# Patient Record
Sex: Male | Born: 1945
Health system: Southern US, Community
[De-identification: ages and names within clinical notes are randomized; demographics above are authoritative.]

## PROBLEM LIST (undated history)

## (undated) DIAGNOSIS — I10 Essential (primary) hypertension: Secondary | ICD-10-CM

## (undated) DIAGNOSIS — I2510 Atherosclerotic heart disease of native coronary artery without angina pectoris: Secondary | ICD-10-CM

## (undated) DIAGNOSIS — G43109 Migraine with aura, not intractable, without status migrainosus: Secondary | ICD-10-CM

## (undated) DIAGNOSIS — M47817 Spondylosis without myelopathy or radiculopathy, lumbosacral region: Secondary | ICD-10-CM

## (undated) DIAGNOSIS — H9191 Unspecified hearing loss, right ear: Secondary | ICD-10-CM

## (undated) DIAGNOSIS — N400 Enlarged prostate without lower urinary tract symptoms: Secondary | ICD-10-CM

## (undated) DIAGNOSIS — R413 Other amnesia: Secondary | ICD-10-CM

## (undated) DIAGNOSIS — K5792 Diverticulitis of intestine, part unspecified, without perforation or abscess without bleeding: Secondary | ICD-10-CM

## (undated) DIAGNOSIS — I252 Old myocardial infarction: Secondary | ICD-10-CM

## (undated) DIAGNOSIS — I700 Atherosclerosis of aorta: Secondary | ICD-10-CM

## (undated) DIAGNOSIS — K279 Peptic ulcer, site unspecified, unspecified as acute or chronic, without hemorrhage or perforation: Secondary | ICD-10-CM

## (undated) DIAGNOSIS — K4090 Unilateral inguinal hernia, without obstruction or gangrene, not specified as recurrent: Secondary | ICD-10-CM

## (undated) DIAGNOSIS — F419 Anxiety disorder, unspecified: Secondary | ICD-10-CM

## (undated) DIAGNOSIS — E785 Hyperlipidemia, unspecified: Secondary | ICD-10-CM

## (undated) DIAGNOSIS — N411 Chronic prostatitis: Secondary | ICD-10-CM

## (undated) HISTORY — DX: Unilateral inguinal hernia, without obstruction or gangrene, not specified as recurrent: K40.90

## (undated) HISTORY — DX: Peptic ulcer, site unspecified, unspecified as acute or chronic, without hemorrhage or perforation: K27.9

## (undated) HISTORY — DX: Anxiety disorder, unspecified: F41.9

## (undated) HISTORY — PX: INGUINAL HERNIA REPAIR: SUR1180

## (undated) HISTORY — PX: CORONARY STENT PLACEMENT: SHX1402

## (undated) HISTORY — DX: Benign prostatic hyperplasia without lower urinary tract symptoms: N40.0

## (undated) HISTORY — DX: Unspecified hearing loss, right ear: H91.91

## (undated) HISTORY — DX: Atherosclerosis of aorta: I70.0

## (undated) HISTORY — DX: Old myocardial infarction: I25.2

## (undated) HISTORY — DX: Chronic prostatitis: N41.1

## (undated) HISTORY — DX: Other amnesia: R41.3

## (undated) HISTORY — DX: Atherosclerotic heart disease of native coronary artery without angina pectoris: I25.10

## (undated) HISTORY — DX: Essential (primary) hypertension: I10

## (undated) HISTORY — DX: Migraine with aura, not intractable, without status migrainosus: G43.109

## (undated) HISTORY — DX: Hyperlipidemia, unspecified: E78.5

## (undated) HISTORY — DX: Spondylosis without myelopathy or radiculopathy, lumbosacral region: M47.817

## (undated) HISTORY — PX: APPENDECTOMY: SHX54

## (undated) HISTORY — DX: Diverticulitis of intestine, part unspecified, without perforation or abscess without bleeding: K57.92

---

## 1987-07-04 DIAGNOSIS — I252 Old myocardial infarction: Secondary | ICD-10-CM

## 1987-07-04 HISTORY — DX: Old myocardial infarction: I25.2

## 1999-12-23 ENCOUNTER — Encounter: Payer: Self-pay | Admitting: Gastroenterology

## 2000-05-22 ENCOUNTER — Encounter: Payer: Self-pay | Admitting: Gastroenterology

## 2005-03-29 ENCOUNTER — Encounter: Payer: Self-pay | Admitting: Gastroenterology

## 2006-02-20 ENCOUNTER — Ambulatory Visit: Payer: Self-pay | Admitting: Internal Medicine

## 2006-02-21 ENCOUNTER — Ambulatory Visit: Payer: Self-pay | Admitting: Internal Medicine

## 2006-02-21 LAB — CONVERTED CEMR LAB: PSA: 0.84 ng/mL

## 2006-04-20 ENCOUNTER — Ambulatory Visit: Payer: Self-pay | Admitting: Internal Medicine

## 2006-06-18 ENCOUNTER — Ambulatory Visit: Payer: Self-pay | Admitting: Internal Medicine

## 2006-07-09 ENCOUNTER — Encounter: Admission: RE | Admit: 2006-07-09 | Discharge: 2006-07-09 | Payer: Self-pay | Admitting: Internal Medicine

## 2007-03-06 ENCOUNTER — Encounter: Payer: Self-pay | Admitting: Internal Medicine

## 2007-03-06 ENCOUNTER — Ambulatory Visit: Payer: Self-pay | Admitting: Internal Medicine

## 2007-03-06 DIAGNOSIS — Z8719 Personal history of other diseases of the digestive system: Secondary | ICD-10-CM | POA: Insufficient documentation

## 2007-03-06 DIAGNOSIS — E785 Hyperlipidemia, unspecified: Secondary | ICD-10-CM | POA: Insufficient documentation

## 2007-03-06 DIAGNOSIS — N411 Chronic prostatitis: Secondary | ICD-10-CM | POA: Insufficient documentation

## 2007-03-06 DIAGNOSIS — J309 Allergic rhinitis, unspecified: Secondary | ICD-10-CM

## 2007-03-06 DIAGNOSIS — I10 Essential (primary) hypertension: Secondary | ICD-10-CM

## 2007-03-06 DIAGNOSIS — K279 Peptic ulcer, site unspecified, unspecified as acute or chronic, without hemorrhage or perforation: Secondary | ICD-10-CM | POA: Insufficient documentation

## 2007-03-06 DIAGNOSIS — N400 Enlarged prostate without lower urinary tract symptoms: Secondary | ICD-10-CM | POA: Insufficient documentation

## 2007-03-06 DIAGNOSIS — I2510 Atherosclerotic heart disease of native coronary artery without angina pectoris: Secondary | ICD-10-CM | POA: Insufficient documentation

## 2007-03-06 LAB — CONVERTED CEMR LAB
ALT: 25 units/L (ref 0–53)
Alkaline Phosphatase: 62 units/L (ref 39–117)
BUN: 15 mg/dL (ref 6–23)
Bilirubin Urine: NEGATIVE
Bilirubin, Direct: 0.1 mg/dL (ref 0.0–0.3)
CO2: 31 meq/L (ref 19–32)
Calcium: 9.3 mg/dL (ref 8.4–10.5)
Cholesterol: 159 mg/dL (ref 0–200)
Eosinophils Absolute: 0.1 10*3/uL (ref 0.0–0.6)
GFR calc Af Amer: 87 mL/min
GFR calc non Af Amer: 72 mL/min
Hemoglobin, Urine: NEGATIVE
Hemoglobin: 15.4 g/dL (ref 13.0–17.0)
LDL Cholesterol: 89 mg/dL (ref 0–99)
Leukocytes, UA: NEGATIVE
Lymphocytes Relative: 18.7 % (ref 12.0–46.0)
MCHC: 35.2 g/dL (ref 30.0–36.0)
MCV: 91.8 fL (ref 78.0–100.0)
Monocytes Absolute: 0.6 10*3/uL (ref 0.2–0.7)
Monocytes Relative: 9.4 % (ref 3.0–11.0)
Neutro Abs: 4.8 10*3/uL (ref 1.4–7.7)
Platelets: 195 10*3/uL (ref 150–400)
Potassium: 4.9 meq/L (ref 3.5–5.1)
Specific Gravity, Urine: 1.01 (ref 1.000–1.03)
TSH: 1.27 microintl units/mL (ref 0.35–5.50)
Total Protein, Urine: NEGATIVE mg/dL
Total Protein: 6.9 g/dL (ref 6.0–8.3)
Urine Glucose: NEGATIVE mg/dL

## 2007-04-04 ENCOUNTER — Ambulatory Visit: Payer: Self-pay | Admitting: Internal Medicine

## 2007-04-11 ENCOUNTER — Ambulatory Visit: Payer: Self-pay | Admitting: Internal Medicine

## 2007-05-06 ENCOUNTER — Ambulatory Visit: Payer: Self-pay | Admitting: Gastroenterology

## 2007-05-07 ENCOUNTER — Encounter: Payer: Self-pay | Admitting: Internal Medicine

## 2007-05-07 ENCOUNTER — Ambulatory Visit: Payer: Self-pay | Admitting: Gastroenterology

## 2007-05-09 ENCOUNTER — Ambulatory Visit: Payer: Self-pay | Admitting: Cardiology

## 2007-06-12 ENCOUNTER — Ambulatory Visit: Payer: Self-pay | Admitting: Internal Medicine

## 2007-06-12 LAB — CONVERTED CEMR LAB
OCCULT 1: NEGATIVE
OCCULT 2: NEGATIVE

## 2007-07-08 ENCOUNTER — Ambulatory Visit: Payer: Self-pay | Admitting: Gastroenterology

## 2007-07-12 ENCOUNTER — Ambulatory Visit: Payer: Self-pay | Admitting: Internal Medicine

## 2007-07-13 ENCOUNTER — Encounter: Payer: Self-pay | Admitting: Internal Medicine

## 2007-07-13 DIAGNOSIS — I252 Old myocardial infarction: Secondary | ICD-10-CM | POA: Insufficient documentation

## 2007-08-29 DIAGNOSIS — K4090 Unilateral inguinal hernia, without obstruction or gangrene, not specified as recurrent: Secondary | ICD-10-CM | POA: Insufficient documentation

## 2007-08-29 DIAGNOSIS — R1013 Epigastric pain: Secondary | ICD-10-CM

## 2007-08-29 DIAGNOSIS — R109 Unspecified abdominal pain: Secondary | ICD-10-CM | POA: Insufficient documentation

## 2007-11-14 ENCOUNTER — Encounter: Payer: Self-pay | Admitting: Internal Medicine

## 2008-03-05 ENCOUNTER — Ambulatory Visit: Payer: Self-pay | Admitting: Internal Medicine

## 2008-03-10 LAB — CONVERTED CEMR LAB
Basophils Absolute: 0 10*3/uL (ref 0.0–0.1)
Basophils Relative: 0.5 % (ref 0.0–3.0)
Bilirubin Urine: NEGATIVE
Bilirubin, Direct: 0.2 mg/dL (ref 0.0–0.3)
Calcium: 9.2 mg/dL (ref 8.4–10.5)
Cholesterol: 151 mg/dL (ref 0–200)
Creatinine, Ser: 1.1 mg/dL (ref 0.4–1.5)
Eosinophils Absolute: 0.1 10*3/uL (ref 0.0–0.7)
GFR calc non Af Amer: 72 mL/min
HDL: 39.4 mg/dL (ref >39.0)
Hemoglobin: 15.2 g/dL (ref 13.0–17.0)
Ketones, ur: NEGATIVE mg/dL
LDL Cholesterol: 89 mg/dL (ref 0–99)
Leukocytes, UA: NEGATIVE
Lymphocytes Relative: 21.2 % (ref 12.0–46.0)
MCHC: 35.1 g/dL (ref 30.0–36.0)
MCV: 94.7 fL (ref 78.0–100.0)
Neutro Abs: 3.7 10*3/uL (ref 1.4–7.7)
Neutrophils Relative %: 66.6 % (ref 43.0–77.0)
PSA: 1.94 ng/mL (ref 0.10–4.00)
RBC: 4.56 M/uL (ref 4.22–5.81)
RDW: 11.7 % (ref 11.5–14.6)
Sodium: 141 meq/L (ref 135–145)
Specific Gravity, Urine: 1.02 (ref 1.000–1.03)
TSH: 1.08 microintl units/mL (ref 0.35–5.50)
Total Bilirubin: 1 mg/dL (ref 0.3–1.2)
Total CHOL/HDL Ratio: 3.8
Triglycerides: 111 mg/dL (ref 0–149)
Urine Glucose: NEGATIVE mg/dL
Urobilinogen, UA: 0.2 (ref 0.0–1.0)

## 2008-03-12 ENCOUNTER — Ambulatory Visit: Payer: Self-pay | Admitting: Cardiovascular Disease

## 2008-09-01 ENCOUNTER — Encounter: Payer: Self-pay | Admitting: Internal Medicine

## 2009-03-19 ENCOUNTER — Ambulatory Visit: Payer: Self-pay | Admitting: Internal Medicine

## 2009-03-19 DIAGNOSIS — H903 Sensorineural hearing loss, bilateral: Secondary | ICD-10-CM | POA: Insufficient documentation

## 2009-03-19 DIAGNOSIS — M7500 Adhesive capsulitis of unspecified shoulder: Secondary | ICD-10-CM

## 2009-03-19 DIAGNOSIS — I700 Atherosclerosis of aorta: Secondary | ICD-10-CM

## 2009-03-19 DIAGNOSIS — H9190 Unspecified hearing loss, unspecified ear: Secondary | ICD-10-CM | POA: Insufficient documentation

## 2009-03-19 LAB — CONVERTED CEMR LAB
ALT: 20 units/L (ref 0–53)
Albumin: 4 g/dL (ref 3.5–5.2)
Basophils Relative: 0.8 % (ref 0.0–3.0)
Bilirubin, Direct: 0.1 mg/dL (ref 0.0–0.3)
CO2: 33 meq/L — ABNORMAL HIGH (ref 19–32)
Chloride: 107 meq/L (ref 96–112)
Eosinophils Relative: 1.7 % (ref 0.0–5.0)
HCT: 44 % (ref 39.0–52.0)
Hemoglobin: 15 g/dL (ref 13.0–17.0)
Hgb A1c MFr Bld: 5.7 % (ref 4.6–6.5)
Leukocytes, UA: NEGATIVE
Lymphs Abs: 1.1 10*3/uL (ref 0.7–4.0)
MCV: 95.1 fL (ref 78.0–100.0)
Monocytes Absolute: 0.6 10*3/uL (ref 0.1–1.0)
Neutro Abs: 3.5 10*3/uL (ref 1.4–7.7)
Neutrophils Relative %: 65.3 % (ref 43.0–77.0)
PSA: 0.89 ng/mL (ref 0.10–4.00)
Potassium: 4.5 meq/L (ref 3.5–5.1)
RBC: 4.63 M/uL (ref 4.22–5.81)
Sodium: 145 meq/L (ref 135–145)
Specific Gravity, Urine: 1.01 (ref 1.000–1.030)
TSH: 1.07 microintl units/mL (ref 0.35–5.50)
Total CHOL/HDL Ratio: 4
Total Protein: 7.5 g/dL (ref 6.0–8.3)
Triglycerides: 164 mg/dL — ABNORMAL HIGH (ref 0.0–149.0)
Urine Glucose: NEGATIVE mg/dL
Urobilinogen, UA: 0.2 (ref 0.0–1.0)
WBC: 5.3 10*3/uL (ref 4.5–10.5)

## 2009-03-23 ENCOUNTER — Telehealth: Payer: Self-pay | Admitting: Internal Medicine

## 2009-03-24 ENCOUNTER — Telehealth: Payer: Self-pay | Admitting: Internal Medicine

## 2009-03-25 ENCOUNTER — Encounter: Payer: Self-pay | Admitting: Internal Medicine

## 2009-03-27 ENCOUNTER — Emergency Department (HOSPITAL_BASED_OUTPATIENT_CLINIC_OR_DEPARTMENT_OTHER): Admission: EM | Admit: 2009-03-27 | Discharge: 2009-03-27 | Payer: Self-pay | Admitting: Emergency Medicine

## 2009-03-27 ENCOUNTER — Encounter: Payer: Self-pay | Admitting: Internal Medicine

## 2009-03-27 ENCOUNTER — Encounter: Payer: Self-pay | Admitting: Cardiovascular Disease

## 2009-03-29 ENCOUNTER — Ambulatory Visit: Payer: Self-pay

## 2009-03-29 ENCOUNTER — Encounter: Payer: Self-pay | Admitting: Internal Medicine

## 2009-03-31 ENCOUNTER — Ambulatory Visit: Payer: Self-pay | Admitting: Radiology

## 2009-04-06 ENCOUNTER — Encounter (INDEPENDENT_AMBULATORY_CARE_PROVIDER_SITE_OTHER): Payer: Self-pay | Admitting: *Deleted

## 2009-04-06 ENCOUNTER — Ambulatory Visit: Payer: Self-pay | Admitting: Cardiovascular Disease

## 2009-04-06 DIAGNOSIS — G459 Transient cerebral ischemic attack, unspecified: Secondary | ICD-10-CM | POA: Insufficient documentation

## 2009-04-13 ENCOUNTER — Encounter: Payer: Self-pay | Admitting: Cardiovascular Disease

## 2009-04-13 ENCOUNTER — Ambulatory Visit: Payer: Self-pay

## 2009-09-01 ENCOUNTER — Encounter: Payer: Self-pay | Admitting: Internal Medicine

## 2009-09-02 ENCOUNTER — Encounter (INDEPENDENT_AMBULATORY_CARE_PROVIDER_SITE_OTHER): Payer: Self-pay | Admitting: *Deleted

## 2009-10-06 ENCOUNTER — Ambulatory Visit: Payer: Self-pay | Admitting: Cardiovascular Disease

## 2009-10-26 ENCOUNTER — Encounter (INDEPENDENT_AMBULATORY_CARE_PROVIDER_SITE_OTHER): Payer: Self-pay | Admitting: *Deleted

## 2009-10-26 ENCOUNTER — Ambulatory Visit (HOSPITAL_COMMUNITY): Admission: RE | Admit: 2009-10-26 | Discharge: 2009-10-26 | Payer: Self-pay | Admitting: Cardiovascular Disease

## 2009-10-26 ENCOUNTER — Encounter: Payer: Self-pay | Admitting: Cardiovascular Disease

## 2009-10-26 ENCOUNTER — Ambulatory Visit: Payer: Self-pay

## 2009-10-26 ENCOUNTER — Ambulatory Visit: Payer: Self-pay | Admitting: Cardiology

## 2009-10-26 ENCOUNTER — Encounter (INDEPENDENT_AMBULATORY_CARE_PROVIDER_SITE_OTHER): Payer: Self-pay

## 2009-11-18 ENCOUNTER — Ambulatory Visit: Payer: Self-pay | Admitting: Internal Medicine

## 2009-11-18 DIAGNOSIS — G454 Transient global amnesia: Secondary | ICD-10-CM

## 2009-11-18 DIAGNOSIS — G43809 Other migraine, not intractable, without status migrainosus: Secondary | ICD-10-CM

## 2010-03-25 ENCOUNTER — Encounter: Payer: Self-pay | Admitting: Internal Medicine

## 2010-04-14 ENCOUNTER — Ambulatory Visit: Payer: Self-pay | Admitting: Cardiovascular Disease

## 2010-04-27 ENCOUNTER — Ambulatory Visit: Payer: Self-pay | Admitting: Internal Medicine

## 2010-04-27 DIAGNOSIS — R5383 Other fatigue: Secondary | ICD-10-CM

## 2010-04-27 DIAGNOSIS — R5381 Other malaise: Secondary | ICD-10-CM | POA: Insufficient documentation

## 2010-04-27 LAB — CONVERTED CEMR LAB
ALT: 28 units/L (ref 0–53)
Alkaline Phosphatase: 57 units/L (ref 39–117)
Basophils Relative: 0.3 % (ref 0.0–3.0)
Bilirubin, Direct: 0.1 mg/dL (ref 0.0–0.3)
Calcium: 9.3 mg/dL (ref 8.4–10.5)
Creatinine, Ser: 1.1 mg/dL (ref 0.4–1.5)
Eosinophils Relative: 1.4 % (ref 0.0–5.0)
Ketones, ur: NEGATIVE mg/dL
Leukocytes, UA: NEGATIVE
Lymphocytes Relative: 24.7 % (ref 12.0–46.0)
Neutrophils Relative %: 63.4 % (ref 43.0–77.0)
PSA: 0.86 ng/mL (ref 0.10–4.00)
RBC: 4.94 M/uL (ref 4.22–5.81)
Specific Gravity, Urine: 1.015 (ref 1.000–1.030)
Total CHOL/HDL Ratio: 5
Total Protein: 6.9 g/dL (ref 6.0–8.3)
Triglycerides: 217 mg/dL — ABNORMAL HIGH (ref 0.0–149.0)
Urobilinogen, UA: 0.2 (ref 0.0–1.0)
WBC: 6.8 10*3/uL (ref 4.5–10.5)

## 2010-05-12 ENCOUNTER — Encounter (INDEPENDENT_AMBULATORY_CARE_PROVIDER_SITE_OTHER): Payer: Self-pay | Admitting: *Deleted

## 2010-05-20 ENCOUNTER — Ambulatory Visit: Payer: Self-pay | Admitting: Internal Medicine

## 2010-05-20 ENCOUNTER — Encounter: Payer: Self-pay | Admitting: Internal Medicine

## 2010-05-20 DIAGNOSIS — R079 Chest pain, unspecified: Secondary | ICD-10-CM

## 2010-05-31 ENCOUNTER — Telehealth (INDEPENDENT_AMBULATORY_CARE_PROVIDER_SITE_OTHER): Payer: Self-pay | Admitting: Radiology

## 2010-06-01 ENCOUNTER — Ambulatory Visit: Payer: Self-pay

## 2010-06-01 ENCOUNTER — Encounter (HOSPITAL_COMMUNITY)
Admission: RE | Admit: 2010-06-01 | Discharge: 2010-08-02 | Payer: Self-pay | Source: Home / Self Care | Attending: Internal Medicine | Admitting: Internal Medicine

## 2010-06-01 ENCOUNTER — Ambulatory Visit: Payer: Self-pay | Admitting: Cardiology

## 2010-06-01 ENCOUNTER — Encounter: Payer: Self-pay | Admitting: Cardiology

## 2010-06-02 ENCOUNTER — Encounter: Payer: Self-pay | Admitting: Internal Medicine

## 2010-06-02 DIAGNOSIS — R9439 Abnormal result of other cardiovascular function study: Secondary | ICD-10-CM | POA: Insufficient documentation

## 2010-06-03 ENCOUNTER — Ambulatory Visit: Payer: Self-pay | Admitting: Cardiovascular Disease

## 2010-06-03 LAB — CONVERTED CEMR LAB
BUN: 17 mg/dL (ref 6–23)
Basophils Absolute: 0 10*3/uL (ref 0.0–0.1)
Calcium: 9.3 mg/dL (ref 8.4–10.5)
Creatinine, Ser: 1.2 mg/dL (ref 0.4–1.5)
GFR calc non Af Amer: 66.91 mL/min (ref >60)
Glucose, Bld: 99 mg/dL (ref 70–99)
Hemoglobin: 14.7 g/dL (ref 13.0–17.0)
INR: 1 (ref 0.8–1.0)
Lymphocytes Relative: 20.6 % (ref 12.0–46.0)
Monocytes Relative: 10.8 % (ref 3.0–12.0)
Neutrophils Relative %: 67.6 % (ref 43.0–77.0)
Platelets: 198 10*3/uL (ref 150.0–400.0)
Prothrombin Time: 11.2 s (ref 9.7–11.8)
RDW: 12.6 % (ref 11.5–14.6)
Sodium: 141 meq/L (ref 135–145)
aPTT: 28.3 s (ref 21.7–28.8)

## 2010-06-09 ENCOUNTER — Ambulatory Visit
Admission: RE | Admit: 2010-06-09 | Disposition: A | Discharge status: Still In Hospital | Payer: Self-pay | Source: Home / Self Care | Admitting: Cardiovascular Disease

## 2010-06-09 ENCOUNTER — Ambulatory Visit (HOSPITAL_COMMUNITY)
Admission: RE | Admit: 2010-06-09 | Discharge: 2010-06-10 | Payer: Self-pay | Source: Home / Self Care | Attending: Cardiovascular Disease | Admitting: Cardiovascular Disease

## 2010-06-11 ENCOUNTER — Emergency Department (HOSPITAL_BASED_OUTPATIENT_CLINIC_OR_DEPARTMENT_OTHER)
Admission: EM | Admit: 2010-06-11 | Discharge: 2010-06-11 | Disposition: A | Discharge status: Other Acute Inpatient Hospital | Payer: Self-pay | Source: Home / Self Care | Admitting: Emergency Medicine

## 2010-06-11 ENCOUNTER — Observation Stay (HOSPITAL_COMMUNITY)
Admission: EM | Admit: 2010-06-11 | Discharge: 2010-06-13 | Payer: Self-pay | Attending: Cardiology | Admitting: Cardiology

## 2010-06-11 ENCOUNTER — Telehealth (INDEPENDENT_AMBULATORY_CARE_PROVIDER_SITE_OTHER): Payer: Self-pay | Admitting: *Deleted

## 2010-06-30 ENCOUNTER — Ambulatory Visit: Payer: Self-pay | Admitting: Cardiovascular Disease

## 2010-07-30 NOTE — H&P (Signed)
  [name] [name], [name] [name]                  ACCOUNT NO.:  [id number]  MEDICAL RECORD NO.:  [id number]          PATIENT TYPE:  OBV  LOCATION:  6522                         FACILITY:  [facility]  PHYSICIAN:  Verne Carrow, MDDATE OF BIRTH:  [birth year]  DATE OF ADMISSION:  06/11/2010 DATE OF DISCHARGE:  06/13/2010                             HISTORY & PHYSICAL   HISTORY OF PRESENT ILLNESS:  [name] [name] is a 65 year old Caucasian male with a history of chronically occluded right coronary artery, who was admitted to [facility] on June 11, 2010, for observation after he had recurrent chest pain.  The patient had been seen the previous week at [facility], at which time, a left heart catheterization showed a chronically occluded right coronary artery as well as a newly stenotic lesion in the first obtuse marginal branch and circumflex artery.  A drug-eluting stent was placed in the obtuse marginal branch at that time.  The patient did well following procedure and was discharged to home on June 10, 2010.  Unfortunately, the following day, he had recurrence of atypical-type chest pain and presented to the [facility] where his troponin was elevated at 0.28.  The patient was transferred to [facility]. A detailed history and physical is in the electronic record as dictated by Dr. Antoine Poche.  We were asked to repeat his cardiac catheterization to assess the patency of the stent.  The past medical history, past surgical history, social history, family history, allergies are unchanged from the dictated history and physical.  Vital signs were stable upon entering the cath lab.  Physical examination was unchanged upon entering cath lab.  This is well documented in the progress note from the previous day.  ASSESSMENT AND PLAN:  The plan is to perform a diagnostic left heart catheterization to assess his coronary arteries.     Verne Carrow,  MD     CM/MEDQ  D:  07/27/2010  T:  07/28/2010  Job:  [id number]  Electronically Signed by Verne Carrow MD on 07/28/2010 02:38:21 PM

## 2010-08-02 ENCOUNTER — Ambulatory Visit: Admit: 2010-08-02 | Payer: Self-pay | Admitting: Gastroenterology

## 2010-08-02 NOTE — Assessment & Plan Note (Signed)
Summary: Cardiology Nuclear Testing  Nuclear Med Background Indications for Stress Test: Evaluation for Ischemia   History: Defibrillator, Echo, Myocardial Infarction, Myocardial Perfusion Study  History Comments: '89 MI (failed ptca-[place]) ; '06 MPI Inf. scar/ nml EF  Symptoms: Chest Pain, Light-Headedness    Nuclear Pre-Procedure Cardiac Risk Factors: History of Smoking, Hypertension, Lipids Caffeine/Decaff Intake: None NPO After: 6:00 PM Lungs: clear IV 0.9% NS with Angio Cath: 20g     IV Site: R Antecubital IV Started by: Irean Hong, RN Chest Size (in) 41     Height (in): 67 Weight (lb): 155 BMI: 24.36  Nuclear Med Study 1 or 2 day study:  1 day     Stress Test Type:  Stress Reading MD:  Marca Ancona, MD     Referring MD:  J.John Resting Radionuclide:  Technetium 28m Tetrofosmin     Resting Radionuclide Dose:  11.0 mCi  Stress Radionuclide:  Technetium 65m Tetrofosmin     Stress Radionuclide Dose:  32.2 mCi   Stress Protocol Exercise Time (min):  13:00 min     Max HR:  166 bpm Max Systolic BP: 148 mm Hg     METS: 15.30 Rate Pressure Product:  [address]109    Stress Test Technologist:  Frederick Peers, EMT-P     Nuclear Technologist:  Domenic Polite, CNMT  Rest Procedure  Myocardial perfusion imaging was performed at rest 45 minutes following the intravenous administration of Technetium 43m Tetrofosmin.  Stress Procedure  The patient exercised for13:27mins .  The patient stopped due to fatigue  and denied any chest pain.  There were no significant ST-T wave changes with occ PVCs.  Technetium 74m Tetrofosmin was injected at peak exercise and myocardial perfusion imaging was performed after a brief delay.  QPS Raw Data Images:  There is interference from nuclear activity from structures below the diaphragm.  This does not affect the ability to read the study. Stress Images:  Basal to mid inferior and inferolateral perfusion defect.  Rest Images:  Normal homogeneous  uptake in all areas of the myocardium. Subtraction (SDS):  Reversible basal to mid inferior and inferolateral perfusion defect.  Transient Ischemic Dilatation:  .92  (Normal <1.22)  Lung/Heart Ratio:  .22  (Normal <0.45)  Quantitative Gated Spect Images QGS EDV:  95 ml QGS ESV:  44 ml QGS EF:  54 % QGS cine images:  Normal wall motion.    Overall Impression  Exercise Capacity: Excellent exercise capacity. BP Response: Normal blood pressure response. Clinical Symptoms: No chest pain, stopped due to fatigue.  ECG Impression: Insignificant upsloping ST segment depression. Overall Impression: Reversible basal to mid inferior and inferolateral perfusion defect suggestive of ischemia.  Overall Impression Comments: There is some interference from gut uptake but this is mainly at rest and does not affect the stress images which are abnormal.   Appended Document: Cardiology Nuclear Testing LMOPT - stress test abnormal with mild area it seems of lack of blood flow with excercise      1)  refer cardiology, asap  robin - to call pt to inform, I will do order  Appended Document: Cardiology Nuclear Testing called pt informed of above information. Patient stated his cardiologist is Dr. Eden Emms.  Appended Document: Cardiology Nuclear Testing please inform [facility] - if Dr Eden Emms on vacation for example, he should be seen by next available cardiologist  Appended Document: Cardiology Nuclear Testing Informed [facility] above information.

## 2010-08-02 NOTE — Assessment & Plan Note (Signed)
Summary: PULLED MUSCLE IN CHEST/NWS  #   Vital Signs:  Patient profile:   65 year old male Height:      67 inches Weight:      157.38 pounds BMI:     24.74 O2 Sat:      97 % on Room air Temp:     97.3 degrees F oral Pulse rate:   79 / minute BP sitting:   104 / 62  (left arm) Cuff size:   regular  Vitals Entered By: Zella Ball Ewing CMA Duncan Dull) (May 20, 2010 3:11 PM)  O2 Flow:  Room air CC: Chest pain and burning for 2 months/RE   CC:  Chest pain and burning for 2 months/RE.  History of Present Illness: here with c/o CP to left lower sternal area for 2 months, midl to mod , intermittent, with a sharp stabbing quality worse with twisting at the waist and sneezing; as well as certain UE excercises at the gym involving both arms above the head; this pain not pleuritic or exertional, but also with pain in the same area other times with dull quality, mild, intermittent, no radiation, not clear if exertional but not assoc with sob, n/v, diaphoresises, palps or syncope.  Pt denies orthopnea, pnd, worsening LE edema, palps, dizziness.  Pt denies new neuro symptoms such as headache, facial or extremity weakness  Pt denies polydipsia, polyuria  Overall good compliance with meds, trying to follow low chol  diet, wt stable, little excercise however except for the gym.  Overall good compliance with meds, and good tolerability. No fever, wt loss, night sweats, loss of appetite or other constitutional symptoms  No ST , cough, or worsening nasal allergy symptoms.  . Denies worsening depressive symptoms, suicidal ideation, or panic. Has ongoing at least mild stressors.  Preventive Screening-Counseling & Management      Drug Use:  no.    Problems Prior to Update: 1)  Chest Pain  (ICD-786.50) 2)  Fatigue  (ICD-780.79) 3)  Prostatitis, Chronic  (ICD-601.1) 4)  Ocular Migraine  (ICD-346.80) 5)  Amnesia, Transient Global  (ICD-437.7) 6)  Ocular Migraine  (ICD-346.80) 7)  Unspecified Transient Cerebral  Ischemia  (ICD-435.9) 8)  Myocardial Infarction, Hx of  (ICD-412) 9)  Hypertension  (ICD-401.9) 10)  Hyperlipidemia  (ICD-272.4) 11)  Coronary Artery Disease  (ICD-414.00) 12)  Special Screening Malig Neoplasms Other Sites  (ICD-V76.49) 13)  Preventive Health Care  (ICD-V70.0) 14)  Hearing Loss, Right Ear  (ICD-389.9) 15)  Frozen Right Shoulder  (ICD-726.0) 16)  Atherosclerosis of Aorta  (ICD-440.0) 17)  Preventive Health Care  (ICD-V70.0) 18)  Inguinal Hernia, Right  (ICD-550.90) 19)  Abdominal Pain  (ICD-789.00) 20)  Epigastric Pain  (ICD-789.06) 21)  Family History of Alcoholism/addiction  (ICD-V61.41) 22)  Prostatitis, Chronic  (ICD-601.1) 23)  Benign Prostatic Hypertrophy  (ICD-600.00) 24)  Peptic Ulcer Disease  (ICD-533.90) 25)  Diverticulitis, Hx of  (ICD-V12.79) 26)  Allergic Rhinitis  (ICD-477.9)  Medications Prior to Update: 1)  Fluticasone Propionate 50 Mcg/act Susp (Fluticasone Propionate) .... Spray 2 Spray Into Both  Nostrils Once A Day 2)  Lisinopril 5 Mg Tabs (Lisinopril) .... Take 1 Tablet By Mouth Once A Day 3)  Nexium 40 Mg Cpdr (Esomeprazole Magnesium) .... Take 1 Capsule By Mouth Once A Day 4)  Simvastatin 40 Mg Tabs (Simvastatin) .Marland Kitchen.. 1 By Mouth Once Daily 5)  Ecotrin Low Strength 81 Mg  Tbec (Aspirin) .Marland Kitchen.. 1 By Mouth Qd  Current Medications (verified): 1)  Fluticasone Propionate 50  Mcg/act Susp (Fluticasone Propionate) .... Spray 2 Spray Into Both  Nostrils Once A Day 2)  Lisinopril 5 Mg Tabs (Lisinopril) .... Take 1 Tablet By Mouth Once A Day 3)  Nexium 40 Mg Cpdr (Esomeprazole Magnesium) .... Take 1 Capsule By Mouth Once A Day 4)  Simvastatin 40 Mg Tabs (Simvastatin) .Marland Kitchen.. 1 By Mouth Once Daily 5)  Ecotrin Low Strength 81 Mg  Tbec (Aspirin) .Marland Kitchen.. 1 By Mouth Qd  Allergies (verified): 1)  ! Pcn 2)  ! * Niaspan  Past History:  Past Medical History: Last updated: 04/27/2010 MYOCARDIAL INFARCTION, HX OF (ICD-412)hx of - 1989 HYPERTENSION  (ICD-401.9) HYPERLIPIDEMIA (ICD-272.4) CORONARY ARTERY DISEASE (ICD-414.00) PDA infarct in [place] failed angioplasty  [facility] 2006 infrerobasal infarct with no ischemia normal EF SPECIAL SCREENING MALIG NEOPLASMS OTHER SITES (ICD-V76.49) PREVENTIVE HEALTH CARE (ICD-V70.0) HEARING LOSS, RIGHT EAR (ICD-389.9) FROZEN RIGHT SHOULDER (ICD-726.0) ATHEROSCLEROSIS OF AORTA (ICD-440.0) PREVENTIVE HEALTH CARE (ICD-V70.0) INGUINAL HERNIA, RIGHT (ICD-550.90) ABDOMINAL PAIN (ICD-789.00) EPIGASTRIC PAIN (ICD-789.06) FAMILY HISTORY OF ALCOHOLISM/ADDICTION (ICD-V61.41) PROSTATITIS, CHRONIC (ICD-601.1) BENIGN PROSTATIC HYPERTROPHY (ICD-600.00) PEPTIC ULCER DISEASE (ICD-533.90) DIVERTICULITIS, HX OF (ICD-V12.79) ALLERGIC RHINITIS (ICD-477.9) hx of h pylori tx 1998 c-spine djd LS Spine DJD transient global amnesia Benign prostatic hypertrophy ocular migraine bilateral hearing loss, mod to severe , right > left; has hearing aids  Past Surgical History: Last updated: 07/12/2007 Inguinal herniorrhaphy Appendectomy s/p PTCA 1989  Family History: Last updated: 07/12/2007 Family History of Alcoholism/Addiction - father and brother father with lung cancer grandfather with laryngeal cancer, heart dz, HTN  Social History: Last updated: 04/27/2010 Married 3 children Former Smoker Alcohol use-yes 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   Head:  normocephalic and atraumatic.   Eyes:  vision grossly intact, pupils equal, and pupils round.   Ears:  R ear normal and L ear normal.   Nose:  nasal dischargemucosal pallor and mucosal edema.   Mouth:  no gingival abnormalities and pharynx pink and moist.   Neck:  supple and no masses.   Lungs:  normal respiratory effort and normal breath sounds.    Heart:  normal rate and regular rhythm.   Abdomen:  soft, non-tender, and normal bowel sounds.   Msk:  no chest wall tenderenss, swelling or rash Extremities:  no edema, no erythema  Neurologic:  strength normal in all extremities and gait normal.   Skin:  color normal and no rashes.   Psych:  not depressed appearing and moderately anxious.     Impression & Recommendations:  Problem # 1:  CHEST PAIN (ICD-786.50) atypical - seems MSK but cant r/o angina  -  Continue all previous medications as before this visit , also for stress test and ecg;  ecg reviewed today - NSC from previous Orders: EKG w/ Interpretation (93000) Cardiolite (Cardiolite) T-2 View CXR, Same Day (71020.5TC)  Problem # 2:  HYPERTENSION (ICD-401.9)  His updated medication list for this problem includes:    Lisinopril 5 Mg Tabs (Lisinopril) .Marland Kitchen... Take 1 tablet by mouth once a day  BP today: 104/62 Prior BP: 116/78 (04/27/2010)  Labs Reviewed: K+: 4.3 (04/27/2010) Creat: : 1.1 (04/27/2010)   Chol: 201 (04/27/2010)   HDL: 43.60 (04/27/2010)   LDL: 90 (03/19/2009)   TG: 217.0 (04/27/2010) stable overall by hx and exam, ok to  continue meds/tx as is   Problem # 3:  ALLERGIC RHINITIS (ICD-477.9)  stable overall by hx and exam, ok to continue meds/tx as is   His updated medication list for this problem includes:    Fluticasone Propionate 50 Mcg/act Susp (Fluticasone propionate) ..... Spray 2 spray into both  nostrils once a day  Complete Medication List: 1)  Fluticasone Propionate 50 Mcg/act Susp (Fluticasone propionate) .... Spray 2 spray into both  nostrils once a day 2)  Lisinopril 5 Mg Tabs (Lisinopril) .... Take 1 tablet by mouth once a day 3)  Nexium 40 Mg Cpdr (Esomeprazole magnesium) .... Take 1 capsule by mouth once a day 4)  Simvastatin 40 Mg Tabs (Simvastatin) .Marland Kitchen.. 1 by mouth once daily 5)  Ecotrin Low Strength 81 Mg Tbec (Aspirin) .Marland Kitchen.. 1 by mouth qd  Patient Instructions: 1)  Your EKG was ok  today 2)  Please go to Radiology in the basement level for your X-Ray today  3)  You will be contacted about the referral(s) to: stress test 4)  Please call the number on the [facility] Card for results of your testing  5)  Please schedule a follow-up appointment as needed.   Orders Added: 1)  EKG w/ Interpretation [93000] 2)  EKG w/ Interpretation [93000] 3)  Cardiolite [Cardiolite] 4)  T-2 View CXR, Same Day [71020.5TC] 5)  Est. Patient Level IV [08657]

## 2010-08-02 NOTE — Letter (Signed)
Summary: Cardiac Catheterization Instructions- [facility]  [facility] [employer], Main Office  [address]   [place], [place] [address]   Phone: [phone]  Fax: [phone]     06/03/2010 MRN: [id number]  [name] [name] [address] [place], [place]  [address]  Dear Mr. Riddle,   You are scheduled for a Cardiac Catheterization on __12/8/11_______ with Dr.__NISHAN____________  Please arrive to the 1st floor of the Heart and Vascular Center at [facility] at _7:30____ am  on the day of your procedure. Please do not arrive before 6:30 a.m. Call the Heart and Vascular Center at [phone] if you are unable to make your appointmnet. The Code to get into the parking garage under the building is_0003_____. Take the elevators to the 1st floor. You must have someone to drive you home. Someone must be with you for the first 24 hours after you arrive home. Please wear clothes that are easy to get on and off and wear slip-on shoes. Do not eat or drink after midnight except water with your medications that morning. Bring all your medications and current insurance cards with you.  ___ DO NOT take these medications before your procedure: ________________________________________________________________  __X_ Make sure you take your aspirin.  ___ You may take ALL of your medications with water that morning. ________________________________________________________________________________________________________________________________  ___ DO NOT take ANY medications before your procedure.  ___ Pre-med instructions:  ________________________________________________________________________________________________________________________________  The usual length of stay after your procedure is 2 to 3 hours. This can vary.  If you have any questions, please call the office at the number listed above.  DR Asher Muir, LPN

## 2010-08-02 NOTE — Assessment & Plan Note (Signed)
Summary: per check out/sf      Allergies Added:   History of Present Illness: [name] [name] has a distant history of IMI in 21 in [place].  His last myovue in 2006 was nonischemic.  He is extremely active with no angina.  He works outside all the time and walks on a treadmill 4 times a week.  He has had recurrent problems with presumed "occular migraines".  He has scintilations in the left eye and then visual disturbance. He has had two episodes since I last saw him lasting about 15 minutes.  He has seen Dr Dione Booze and has no intrinisic eye problem.  Echo 4/11 had normal LV EF and negative bubble study He has some GERD and irritable bowel symptoms that he sees Stark for and some am arthritis but no other active issues  Current Problems (verified): 1)  Amnesia, Transient Global  (ICD-437.7) 2)  Ocular Migraine  (ICD-346.80) 3)  Unspecified Transient Cerebral Ischemia  (ICD-435.9) 4)  Myocardial Infarction, Hx of  (ICD-412) 5)  Hypertension  (ICD-401.9) 6)  Hyperlipidemia  (ICD-272.4) 7)  Coronary Artery Disease  (ICD-414.00) 8)  Special Screening Malig Neoplasms Other Sites  (ICD-V76.49) 9)  Preventive Health Care  (ICD-V70.0) 10)  Hearing Loss, Right Ear  (ICD-389.9) 11)  Frozen Right Shoulder  (ICD-726.0) 12)  Atherosclerosis of Aorta  (ICD-440.0) 13)  Preventive Health Care  (ICD-V70.0) 14)  Inguinal Hernia, Right  (ICD-550.90) 15)  Abdominal Pain  (ICD-789.00) 16)  Epigastric Pain  (ICD-789.06) 17)  Family History of Alcoholism/addiction  (ICD-V61.41) 18)  Prostatitis, Chronic  (ICD-601.1) 19)  Benign Prostatic Hypertrophy  (ICD-600.00) 20)  Peptic Ulcer Disease  (ICD-533.90) 21)  Diverticulitis, Hx of  (ICD-V12.79) 22)  Allergic Rhinitis  (ICD-477.9)  Current Medications (verified): 1)  Fluticasone Propionate 50 Mcg/act Susp (Fluticasone Propionate) .... Spray 2 Spray Into Both  Nostrils Once A Day 2)  Lisinopril 5 Mg Tabs (Lisinopril) .... Take 1 Tablet By Mouth Once A Day 3)  Nexium 40 Mg  Cpdr (Esomeprazole Magnesium) .... Take 1 Capsule By Mouth Once A Day 4)  Simvastatin 40 Mg Tabs (Simvastatin) .Marland Kitchen.. 1 By Mouth Once Daily 5)  Ecotrin Low Strength 81 Mg  Tbec (Aspirin) .Marland Kitchen.. 1 By Mouth Qd  Allergies (verified): 1)  ! Pcn 2)  ! * Niaspan  Past History:  Past Medical History: Last updated: 11/18/2009 Current Problems:  MYOCARDIAL INFARCTION, HX OF (ICD-412)hx of - 1989 HYPERTENSION (ICD-401.9) HYPERLIPIDEMIA (ICD-272.4) CORONARY ARTERY DISEASE (ICD-414.00) PDA infarct in [place] failed angioplasty  [facility] 2006 infrerobasal infarct with no ischemia normal EF SPECIAL SCREENING MALIG NEOPLASMS OTHER SITES (ICD-V76.49) PREVENTIVE HEALTH CARE (ICD-V70.0) HEARING LOSS, RIGHT EAR (ICD-389.9) FROZEN RIGHT SHOULDER (ICD-726.0) ATHEROSCLEROSIS OF AORTA (ICD-440.0) PREVENTIVE HEALTH CARE (ICD-V70.0) INGUINAL HERNIA, RIGHT (ICD-550.90) ABDOMINAL PAIN (ICD-789.00) EPIGASTRIC PAIN (ICD-789.06) FAMILY HISTORY OF ALCOHOLISM/ADDICTION (ICD-V61.41) PROSTATITIS, CHRONIC (ICD-601.1) BENIGN PROSTATIC HYPERTROPHY (ICD-600.00) PEPTIC ULCER DISEASE (ICD-533.90) DIVERTICULITIS, HX OF (ICD-V12.79) ALLERGIC RHINITIS (ICD-477.9) hx of h pylori tx 1998 c-spine djd LS Spine DJD transient global amnesia  Past Surgical History: Last updated: 07/12/2007 Inguinal herniorrhaphy Appendectomy s/p PTCA 1989  Family History: Last updated: 07/12/2007 Family History of Alcoholism/Addiction - father and brother father with lung cancer grandfather with laryngeal cancer, heart dz, HTN  Social History: Last updated: 03/05/2008 Married 3 children Former Smoker Alcohol use-yes 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   Vital  Signs:  Patient profile:   65 year old male Height:      67.5 inches Weight:      158.8 pounds BMI:     24.59 Pulse rate:   73 / minute Resp:     12 per minute BP sitting:   115 / 80  (left arm)  Vitals Entered By: Kem Parkinson (April 14, 2010 11:29 AM)   Impression & Recommendations:  Problem # 1:  MYOCARDIAL INFARCTION, HX OF (ICD-412) 1989 with no angina, normal ECG and normal echo.  Continue ASA.   His updated medication list for this problem includes:    Lisinopril 5 Mg Tabs (Lisinopril) .Marland Kitchen... Take 1 tablet by mouth once a day    Ecotrin Low Strength 81 Mg Tbec (Aspirin) .Marland Kitchen... 1 by mouth qd  Problem # 2:  HYPERTENSION (ICD-401.9) Well controlled His updated medication list for this problem includes:    Lisinopril 5 Mg Tabs (Lisinopril) .Marland Kitchen... Take 1 tablet by mouth once a day    Ecotrin Low Strength 81 Mg Tbec (Aspirin) .Marland Kitchen... 1 by mouth qd  Orders: EKG w/ Interpretation (93000)  Problem # 3:  HYPERLIPIDEMIA (ICD-272.4) Continue statin.  Low chol diet His updated medication list for this problem includes:    Simvastatin 40 Mg Tabs (Simvastatin) .Marland Kitchen... 1 by mouth once daily  CHOL: 165 (03/19/2009)   LDL: 90 (03/19/2009)   HDL: 41.80 (03/19/2009)   TG: 164.0 (03/19/2009)  Patient Instructions: 1)  Your physician recommends that you schedule a follow-up appointment in: YEAR WITH DR Eden Emms 2)  Your physician recommends that you continue on your current medications as directed. Please refer to the Current Medication list given to you today.   EKG Report  Procedure date:  04/14/2010  Findings:      NSR 73 Normal ECG

## 2010-08-02 NOTE — Letter (Signed)
Summary: New Patient letter  [facility]  [address] [place], [place] [address]   Phone: [phone]  Fax: [phone]       05/12/2010 MRN: [id number]    [name] [name] [address] [place], [place]  [address]  Dear [name] [name],  Welcome to the Gastroenterology Division at [facility].    You are scheduled to see DR. STARK  on MONDAY, 05/30/10 at 3:00 P.M. on the 3rd floor at [facility], [address]  We ask that you try to arrive at ouroffice 15 minutes prior to your appointment time to allow for check-in.  We would like you to complete the enclosed self-administered evaluation form prior to your visit and bring it with you on the day of your appointment.  We will review it with you.  Also, please bring a complete list of all your medications or, if you prefer, bring the medication bottles and we will list them.  Please bring your insurance card so that we may make a copy of it.  If your insurance requires a referral to see a specialist, please bring your referral form from your primary care physician.  Co-payments are due at the time of your visit and may be paid by cash, check or credit card.     Your office visit will consist of a consult with your physician (includes a physical exam), any laboratory testing he/she may order, scheduling of any necessary diagnostic testing (e.g. x-ray, ultrasound, CT-scan), and scheduling of a procedure (e.g. Endoscopy, Colonoscopy) if required.  Please allow enough time on your schedule to allow for any/all of these possibilities.    If you cannot keep your appointment, please call [phone] to cancel or reschedule prior to your appointment date.  This allows [place] the opportunity to schedule an appointment for another patient in need of care.  If you do not cancel or reschedule by 5 p.m. the business day prior to your appointment date, you will be charged a $50.00 late cancellation/no-show fee.    Thank you  for choosing [facility] for your medical needs.  We appreciate the opportunity to care for you.  Please visit [place] at our website  to learn more about our practice.                     Sincerely,                                                             The Gastroenterology Division

## 2010-08-02 NOTE — Assessment & Plan Note (Signed)
Summary: YEARLY FU/ MEDICARE/ LABS AFTER/ NWS  #   Vital Signs:  Patient profile:   65 year old male Height:      67.5 inches Weight:      160.13 pounds O2 Sat:      97 % Temp:     97.5 degrees F Pulse rate:   79 / minute BP sitting:   116 / 78  (left arm)  Vitals Entered By: Jarome Lamas (April 27, 2010 8:13 AM)  CC: yearly fl/up  /pb   CC:  yearly fl/up  /pb.  History of Present Illness: here for yearly f/u;  has new hearing aids from the [place] and still some difficulty hearing more on the right;  Pt denies CP, worsening sob, doe, wheezing, orthopnea, pnd, worsening LE edema, palps, dizziness or syncope  Pt denies new neuro symptoms such as headache, facial or extremity weakness  No fever, wt loss, night sweats, loss of appetite or other constitutional symptoms   Pt denies polydipsia, polyuria..  Overall good compliance with meds, trying to follow low chol diet, wt stable, little excercise however.  Denies  depressive symptoms, suicidal ideation, or panic.  Pt states good ability with ADL's, low fall risk, home safety reviewed and adequate, no other significant change in hearing or vision, trying to follow lower chol diet, and occasionally active only with regular excercise.  Does have signficiant nasal allergy symtpoms with clearish drainage and sinus pressure, without fever or other pain , not well controlled on current meds and OTC 's.   Preventive Screening-Counseling & Management      Drug Use:  no.    Problems Prior to Update: 1)  Fatigue  (ICD-780.79) 2)  Prostatitis, Chronic  (ICD-601.1) 3)  Ocular Migraine  (ICD-346.80) 4)  Amnesia, Transient Global  (ICD-437.7) 5)  Ocular Migraine  (ICD-346.80) 6)  Unspecified Transient Cerebral Ischemia  (ICD-435.9) 7)  Myocardial Infarction, Hx of  (ICD-412) 8)  Hypertension  (ICD-401.9) 9)  Hyperlipidemia  (ICD-272.4) 10)  Coronary Artery Disease  (ICD-414.00) 11)  Special Screening Malig Neoplasms Other Sites  (ICD-V76.49) 12)   Preventive Health Care  (ICD-V70.0) 13)  Hearing Loss, Right Ear  (ICD-389.9) 14)  Frozen Right Shoulder  (ICD-726.0) 15)  Atherosclerosis of Aorta  (ICD-440.0) 16)  Preventive Health Care  (ICD-V70.0) 17)  Inguinal Hernia, Right  (ICD-550.90) 18)  Abdominal Pain  (ICD-789.00) 19)  Epigastric Pain  (ICD-789.06) 20)  Family History of Alcoholism/addiction  (ICD-V61.41) 21)  Prostatitis, Chronic  (ICD-601.1) 22)  Benign Prostatic Hypertrophy  (ICD-600.00) 23)  Peptic Ulcer Disease  (ICD-533.90) 24)  Diverticulitis, Hx of  (ICD-V12.79) 25)  Allergic Rhinitis  (ICD-477.9)  Medications Prior to Update: 1)  Fluticasone Propionate 50 Mcg/act Susp (Fluticasone Propionate) .... Spray 2 Spray Into Both  Nostrils Once A Day 2)  Lisinopril 5 Mg Tabs (Lisinopril) .... Take 1 Tablet By Mouth Once A Day 3)  Nexium 40 Mg Cpdr (Esomeprazole Magnesium) .... Take 1 Capsule By Mouth Once A Day 4)  Simvastatin 40 Mg Tabs (Simvastatin) .Marland Kitchen.. 1 By Mouth Once Daily 5)  Ecotrin Low Strength 81 Mg  Tbec (Aspirin) .Marland Kitchen.. 1 By Mouth Qd  Current Medications (verified): 1)  Fluticasone Propionate 50 Mcg/act Susp (Fluticasone Propionate) .... Spray 2 Spray Into Both  Nostrils Once A Day 2)  Lisinopril 5 Mg Tabs (Lisinopril) .... Take 1 Tablet By Mouth Once A Day 3)  Nexium 40 Mg Cpdr (Esomeprazole Magnesium) .... Take 1 Capsule By Mouth Once A Day 4)  Simvastatin 40 Mg Tabs (Simvastatin) .Marland Kitchen.. 1 By Mouth Once Daily 5)  Ecotrin Low Strength 81 Mg  Tbec (Aspirin) .Marland Kitchen.. 1 By Mouth Qd  Allergies (verified): 1)  ! Pcn 2)  ! 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 Drug use-no  Risk Factors: Smoking  Status: quit (07/12/2007)  Past Medical History: MYOCARDIAL INFARCTION, HX OF (ICD-412)hx of - 1989 HYPERTENSION (ICD-401.9) HYPERLIPIDEMIA (ICD-272.4) CORONARY ARTERY DISEASE (ICD-414.00) PDA infarct in [place] failed angioplasty  [facility] 2006 infrerobasal infarct with no ischemia normal EF SPECIAL SCREENING MALIG NEOPLASMS OTHER SITES (ICD-V76.49) PREVENTIVE HEALTH CARE (ICD-V70.0) HEARING LOSS, RIGHT EAR (ICD-389.9) FROZEN RIGHT SHOULDER (ICD-726.0) ATHEROSCLEROSIS OF AORTA (ICD-440.0) PREVENTIVE HEALTH CARE (ICD-V70.0) INGUINAL HERNIA, RIGHT (ICD-550.90) ABDOMINAL PAIN (ICD-789.00) EPIGASTRIC PAIN (ICD-789.06) FAMILY HISTORY OF ALCOHOLISM/ADDICTION (ICD-V61.41) PROSTATITIS, CHRONIC (ICD-601.1) BENIGN PROSTATIC HYPERTROPHY (ICD-600.00) PEPTIC ULCER DISEASE (ICD-533.90) DIVERTICULITIS, HX OF (ICD-V12.79) ALLERGIC RHINITIS (ICD-477.9) hx of h pylori tx 1998 c-spine djd LS Spine DJD transient global amnesia Benign prostatic hypertrophy ocular migraine bilateral hearing loss, mod to severe , right > left; has hearing aids  Past Surgical History: Reviewed history from 07/12/2007 and no changes required. Inguinal herniorrhaphy Appendectomy s/p PTCA 1989  Family History: Reviewed history from 07/12/2007 and no changes required. Family History of Alcoholism/Addiction - father and brother father with lung cancer grandfather with laryngeal cancer, heart dz, HTN  Social History: Reviewed history from 03/05/2008 and no changes required. 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         all otherwise negative per pt -  excpet for ongoing mild fatigue, without depressive symptoms or daytime somnolnece  Physical Exam  General:  alert and well-developed.   Head:  normocephalic and atraumatic.   Eyes:  vision grossly intact, pupils equal, and pupils round.   Ears:  R ear normal and L ear normal.   Nose:  nasal dischargemucosal pallor and mucosal edema.   Mouth:  no gingival abnormalities and pharynx pink and moist.   Neck:  supple and no masses.   Lungs:  normal respiratory effort and normal breath sounds.   Heart:  normal rate and regular rhythm.   Abdomen:  soft, non-tender, and normal bowel sounds.   Msk:  no joint tenderness and no joint swelling.   Extremities:  no edema, no erythema  Neurologic:  cranial nerves II-XII intact, strength normal in all extremities, gait normal, and DTRs symmetrical and normal.  except for mild right hearing loss - chronic Skin:  color normal and no rashes.   Psych:  not anxious appearing and not depressed appearing.     Impression & Recommendations:  Problem # 1:  HYPERTENSION (ICD-401.9)  His updated medication list for this problem includes:    Lisinopril 5 Mg Tabs (Lisinopril) .Marland Kitchen... Take 1 tablet by mouth once a day  BP today: 116/78 Prior BP:  115/80 (04/14/2010)  Labs Reviewed: K+: 4.5 (03/19/2009) Creat: : 1.0 (03/19/2009)   Chol: 165 (03/19/2009)   HDL: 41.80 (03/19/2009)   LDL: 90 (03/19/2009)   TG: 164.0 (03/19/2009)  Orders: TLB-Udip ONLY (81003-UDIP) stable overall by hx and exam, ok to continue meds/tx as is   Problem # 2:  HYPERLIPIDEMIA (ICD-272.4)  His updated medication list for this problem includes:    Simvastatin 40 Mg Tabs (Simvastatin) .Marland Kitchen... 1 by mouth once daily  Labs Reviewed: SGOT: 29 (03/19/2009)   SGPT: 20 (03/19/2009)   HDL:41.80  (03/19/2009), 39.4 (03/05/2008)  LDL:90 (03/19/2009), 89 (03/05/2008)  Chol:165 (03/19/2009), 151 (03/05/2008)  Trig:164.0 (03/19/2009), 111 (03/05/2008)  Orders: TLB-Lipid Panel (80061-LIPID) stable overall by hx and exam, ok to continue meds/tx as is , Pt to continue diet efforts, good med tolerance; to check labs - goal LDL less than  70  Problem # 3:  ALLERGIC RHINITIS (ICD-477.9)  His updated medication list for this problem includes:    Fluticasone Propionate 50 Mcg/act Susp (Fluticasone propionate) ..... Spray 2 spray into both  nostrils once a day treat as above, f/u any worsening signs or symptoms   Problem # 4:  FATIGUE (ICD-780.79) exam benign, to check labs below; follow with expectant management  Orders: TLB-BMP (Basic Metabolic Panel-BMET) (80048-METABOL) TLB-CBC Platelet - w/Differential (85025-CBCD) TLB-Hepatic/Liver Function Pnl (80076-HEPATIC) TLB-TSH (Thyroid Stimulating Hormone) (84443-TSH)  Problem # 5:  Preventive Health Care (ICD-V70.0) not charged - had flu shot earlier this seaons, ecg done 2 wks ago, preventioin o/w up to date  Complete Medication List: 1)  Fluticasone Propionate 50 Mcg/act Susp (Fluticasone propionate) .... Spray 2 spray into both  nostrils once a day 2)  Lisinopril 5 Mg Tabs (Lisinopril) .... Take 1 tablet by mouth once a day 3)  Nexium 40 Mg Cpdr (Esomeprazole magnesium) .... Take 1 capsule by mouth once a day 4)  Simvastatin 40 Mg Tabs (Simvastatin) .Marland Kitchen.. 1 by mouth once daily 5)  Ecotrin Low Strength 81 Mg Tbec (Aspirin) .Marland Kitchen.. 1 by mouth qd  Other Orders: TLB-PSA (Prostate Specific Antigen) (84153-PSA)  Patient Instructions: 1)  Please go to the Lab in the basement for your blood and/or urine tests today  2)  Please call the number on the [facility] Card for results of your testing  3)  Please take all new medications as prescribed  4)  Continue all previous medications as before this visit  5)  Please schedule a follow-up appointment in 1  year, or sooner if needed Prescriptions: SIMVASTATIN 40 MG TABS (SIMVASTATIN) 1 by mouth once daily  #90 x 3   Entered and Authorized by:   Corwin Levins MD   Signed by:   Corwin Levins MD on 04/27/2010   Method used:   Print then Give to Patient   RxID:   [id number] NEXIUM 40 MG CPDR (ESOMEPRAZOLE MAGNESIUM) Take 1 capsule by mouth once a day  #90 x 3   Entered and Authorized by:   Corwin Levins MD   Signed by:   Corwin Levins MD on 04/27/2010   Method used:   Print then Give to Patient   RxID:   [id number] LISINOPRIL 5 MG TABS (LISINOPRIL) Take 1 tablet by mouth once a day  #90 x 3   Entered and Authorized by:   Corwin Levins MD   Signed by:   Corwin Levins MD on 04/27/2010   Method used:   Print then Give to Patient   RxID:  [id number] FLUTICASONE PROPIONATE 50 MCG/ACT SUSP (FLUTICASONE PROPIONATE) Spray 2 spray into both  nostrils once a day  #3 x 3   Entered and Authorized by:   Corwin Levins MD   Signed by:   Corwin Levins MD on 04/27/2010   Method used:   Print then Give to Patient   RxID:   [id number]    Orders Added: 1)  TLB-BMP (Basic Metabolic Panel-BMET) [80048-METABOL] 2)  TLB-CBC Platelet - w/Differential [85025-CBCD] 3)  TLB-Hepatic/Liver Function Pnl [80076-HEPATIC] 4)  TLB-TSH (Thyroid Stimulating Hormone) [84443-TSH] 5)  TLB-Lipid Panel [80061-LIPID] 6)  TLB-PSA (Prostate Specific Antigen) [84153-PSA] 7)  TLB-Udip ONLY [81003-UDIP] 8)  Est. Patient Level IV [95284]

## 2010-08-02 NOTE — Progress Notes (Signed)
Summary: nuc pre-procedure  Phone Note Outgoing Call   Call placed by: Domenic Polite, CNMT,  May 31, 2010 11:44 AM Call placed to: Patient Reason for Call: Confirm/change Appt Summary of Call: Reviewed information on My[facility] Information Sheet (see scanned document for further details).  Spoke with  patient.      Nuclear Med Background Indications for Stress Test: Evaluation for Ischemia   History: Echo, Myocardial Infarction, Myocardial Perfusion Study  History Comments: '89 MI (failed ptca-[place]) ; '06 MPI Inf. scar/ nml EF  Symptoms: Chest Pain    Nuclear Pre-Procedure Cardiac Risk Factors: History of Smoking, Hypertension, Lipids Height (in): 67

## 2010-08-02 NOTE — Assessment & Plan Note (Signed)
Summary: MIGRAINES---STC   Vital Signs:  Patient profile:   65 year old male Height:      67.5 inches Weight:      157.50 pounds BMI:     24.39 O2 Sat:      96 % on Room air Temp:     97.5 degrees F oral Pulse rate:   77 / minute BP sitting:   108 / 72  (left arm) Cuff size:   regular  Vitals Entered ByZella Ball Ewing (Nov 18, 2009 11:45 AM)  O2 Flow:  Room air  CC: Migraines/RE   CC:  Migraines/RE.  History of Present Illness: here with 6 mo (first episode oct 2010) intermittent (but few - total 4 episodes now) episodes of left eye blurry vision with wavy lines without pain for about 15 min each;  initial  CT head neg after saw optho  - no retinal problem (dr Dione Booze) - who suggested ocular migraine as etiology;  had another episodes in nov and dec 2010,  saw Dr Eden Emms in Jan 2011 with "good exam"  per pt and agreed to suggested echo with bubble test - neg;  since then had 2 more episodes;  last approx 15 to 20 min each;  has some vague pressure about the left eye it seems but no pain per pt;  Pt denies CP, sob, doe, wheezing, orthopnea, pnd, worsening LE edema, palps, dizziness or syncope   Pt denies new neuro symptoms such as headache, facial or extremity weakness  He is here to see if any other option such as MRI or other tx might seem approprate .  As far as trigger, he thinks change in weather such as decreasing barometric pressure might be related in retrospect after our discussion today. Seems mild nervous today but denies increased depressive symtpom, anxiety or panic.   He is wondering if his allergy symptoms or tx could be related alhtough he is doing well with the generic flonase with nasal congsetive symtpoms.    Problems Prior to Update: 1)  Ocular Migraine  (ICD-346.80) 2)  Unspecified Transient Cerebral Ischemia  (ICD-435.9) 3)  Myocardial Infarction, Hx of  (ICD-412) 4)  Hypertension  (ICD-401.9) 5)  Hyperlipidemia  (ICD-272.4) 6)  Coronary Artery Disease   (ICD-414.00) 7)  Special Screening Malig Neoplasms Other Sites  (ICD-V76.49) 8)  Preventive Health Care  (ICD-V70.0) 9)  Hearing Loss, Right Ear  (ICD-389.9) 10)  Frozen Right Shoulder  (ICD-726.0) 11)  Atherosclerosis of Aorta  (ICD-440.0) 12)  Preventive Health Care  (ICD-V70.0) 13)  Inguinal Hernia, Right  (ICD-550.90) 14)  Abdominal Pain  (ICD-789.00) 15)  Epigastric Pain  (ICD-789.06) 16)  Family History of Alcoholism/addiction  (ICD-V61.41) 17)  Prostatitis, Chronic  (ICD-601.1) 18)  Benign Prostatic Hypertrophy  (ICD-600.00) 19)  Peptic Ulcer Disease  (ICD-533.90) 20)  Diverticulitis, Hx of  (ICD-V12.79) 21)  Allergic Rhinitis  (ICD-477.9)  Medications Prior to Update: 1)  Fluticasone Propionate 50 Mcg/act Susp (Fluticasone Propionate) .... Spray 2 Spray Into Both  Nostrils Once A Day 2)  Lisinopril 5 Mg Tabs (Lisinopril) .... Take 1 Tablet By Mouth Once A Day 3)  Nexium 40 Mg Cpdr (Esomeprazole Magnesium) .... Take 1 Capsule By Mouth Once A Day 4)  Simvastatin 40 Mg Tabs (Simvastatin) .Marland Kitchen.. 1 By Mouth Once Daily 5)  Ecotrin Low Strength 81 Mg  Tbec (Aspirin) .Marland Kitchen.. 1 By Mouth Qd  Current Medications (verified): 1)  Fluticasone Propionate 50 Mcg/act Susp (Fluticasone Propionate) .... Spray 2 Spray Into Both  Nostrils Once A Day 2)  Lisinopril 5 Mg Tabs (Lisinopril) .... Take 1 Tablet By Mouth Once A Day 3)  Nexium 40 Mg Cpdr (Esomeprazole Magnesium) .... Take 1 Capsule By Mouth Once A Day 4)  Simvastatin 40 Mg Tabs (Simvastatin) .Marland Kitchen.. 1 By Mouth Once Daily 5)  Ecotrin Low Strength 81 Mg  Tbec (Aspirin) .Marland Kitchen.. 1 By Mouth Qd  Allergies (verified): 1)  ! Pcn 2)  ! 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  Risk Factors: Smoking Status:  quit (07/12/2007)  Past Medical History: Current Problems:  MYOCARDIAL INFARCTION, HX OF (ICD-412)hx of - 1989 HYPERTENSION (ICD-401.9) HYPERLIPIDEMIA (ICD-272.4) CORONARY ARTERY DISEASE (ICD-414.00) PDA infarct in [place] failed angioplasty  [facility] 2006 infrerobasal infarct with no ischemia normal EF SPECIAL SCREENING MALIG NEOPLASMS OTHER SITES (ICD-V76.49) PREVENTIVE HEALTH CARE (ICD-V70.0) HEARING LOSS, RIGHT EAR (ICD-389.9) FROZEN RIGHT SHOULDER (ICD-726.0) ATHEROSCLEROSIS OF AORTA (ICD-440.0) PREVENTIVE HEALTH CARE (ICD-V70.0) INGUINAL HERNIA, RIGHT (ICD-550.90) ABDOMINAL PAIN (ICD-789.00) EPIGASTRIC PAIN (ICD-789.06) FAMILY HISTORY OF ALCOHOLISM/ADDICTION (ICD-V61.41) PROSTATITIS, CHRONIC (ICD-601.1) BENIGN PROSTATIC HYPERTROPHY (ICD-600.00) PEPTIC ULCER DISEASE (ICD-533.90) DIVERTICULITIS, HX OF (ICD-V12.79) ALLERGIC RHINITIS (ICD-477.9) hx of h pylori tx 1998 c-spine djd LS Spine DJD transient global amnesia  Review of Systems       all otherwise negative per pt -    Physical Exam  General:  alert and well-developed.   Head:  normocephalic and atraumatic.   Eyes:  vision grossly intact, pupils equal, and pupils round.   Ears:  R ear normal and L ear normal.   Nose:  no external deformity and no nasal discharge.   Mouth:  no gingival abnormalities and pharynx pink and moist.   Neck:  supple and no masses.   Lungs:  normal respiratory effort and normal breath sounds.   Heart:  normal rate and regular rhythm.   Extremities:  no edema, no erythema  Neurologic:  cranial nerves II-XII intact, strength normal in all extremities, gait normal, and DTRs symmetrical and normal.  except for mild right hearing loss - chronic   Impression & Recommendations:  Problem # 1:  OCULAR MIGRAINE (ICD-346.80)  His updated medication list for this problem includes:    Ecotrin Low Strength 81 Mg Tbec (Aspirin) .Marland Kitchen... 1 by mouth qd d/w pt - hx seems to support; declines  cardene as needed as the episodes only last 15 min and are painless;  ok to folllow - do not feel he needs MRI at this time;  interesting he has had hx of transient global amnesia in the past as well; and ongoing anxiety , and mild left hearing loss;  consider neurology for any worsening symptoms but declines at this time;  does not seem to require further psychiatric eval or tx at this time  Problem # 2:  HYPERTENSION (ICD-401.9)  His updated medication list for this problem includes:    Lisinopril 5 Mg Tabs (Lisinopril) .Marland Kitchen... Take 1 tablet by mouth once a day  BP today: 108/72 Prior BP: 120/70 (10/06/2009)  Labs Reviewed: K+: 4.5 (03/19/2009) Creat: : 1.0 (03/19/2009)   Chol: 165 (03/19/2009)   HDL: 41.80 (03/19/2009)   LDL: 90 (03/19/2009)   TG: 164.0 (03/19/2009) stable overall by hx and exam, ok to continue meds/tx as is   Problem # 3:  ALLERGIC RHINITIS (ICD-477.9)  His updated  medication list for this problem includes:    Fluticasone Propionate 50 Mcg/act Susp (Fluticasone propionate) ..... Spray 2 spray into both  nostrils once a day stable overall by hx and exam, ok to continue meds/tx as is - unlikely to have anything to do with the eye symptoms he describes, Continue all previous medications as before this visit   Complete Medication List: 1)  Fluticasone Propionate 50 Mcg/act Susp (Fluticasone propionate) .... Spray 2 spray into both  nostrils once a day 2)  Lisinopril 5 Mg Tabs (Lisinopril) .... Take 1 tablet by mouth once a day 3)  Nexium 40 Mg Cpdr (Esomeprazole magnesium) .... Take 1 capsule by mouth once a day 4)  Simvastatin 40 Mg Tabs (Simvastatin) .Marland Kitchen.. 1 by mouth once daily 5)  Ecotrin Low Strength 81 Mg Tbec (Aspirin) .Marland Kitchen.. 1 by mouth qd  Patient Instructions: 1)  Continue all previous medications as before this visit  2)  call or return if pain or other worsening symptoms occur 3)  Please schedule a follow-up appointment in 4 months with CPX labs

## 2010-08-02 NOTE — Procedures (Signed)
Summary: EGD/Dan Fabio Neighbors MD  EGD/Dan Fabio Neighbors MD   Imported By: Lester [place] 05/31/2010 09:25:20  _____________________________________________________________________  External Attachment:    Type:   Image     Comment:   External Document

## 2010-08-02 NOTE — Letter (Signed)
Summary: [facility] - Outpatient Coinsurance Notice  [facility] - Outpatient [facility]   Imported By: Marylou Mccoy 10/28/2009 16:30:19  _____________________________________________________________________  External Attachment:    Type:   Image     Comment:   External Document

## 2010-08-02 NOTE — Letter (Signed)
Summary: [facility]  [facility]   Imported By: Lennie Odor 09/09/2009 11:54:07  _____________________________________________________________________  External Attachment:    Type:   Image     Comment:   External Document

## 2010-08-02 NOTE — Miscellaneous (Signed)
Summary: Orders Update   Clinical Lists Changes  Problems: Added new problem of MYOCARDIAL PERFUSION SCAN, WITH STRESS TEST, ABNORMAL (ICD-794.39) Orders: Added new Referral order of Cardiology Referral (Cardiology) - Signed

## 2010-08-02 NOTE — Procedures (Signed)
Summary: Thomes Dinning MD  Thomes Dinning MD   Imported By: Lester [place] 05/31/2010 09:24:12  _____________________________________________________________________  External Attachment:    Type:   Image     Comment:   External Document

## 2010-08-02 NOTE — Letter (Signed)
Summary: Appointment - Reminder 2  [facility], Main Office  [address]   [place], [place] [address]   Phone: [phone]  Fax: [phone]     September 02, 2009 MRN: [id number]   [name] [name] [address] [place], [place]  [address]   Dear [name],  Our records indicate that it is time to schedule a follow-up appointment with Dr. Eden Emms. It is very important that we reach you to schedule this appointment. We look forward to participating in your health care needs. Please contact us at the number listed above at your earliest convenience to schedule your appointment.  If you are unable to make an appointment at this time, give [place] a call so we can update our records.     Sincerely,   [facility]igdalia Dk [facility] Scheduling Team

## 2010-08-02 NOTE — Assessment & Plan Note (Signed)
Summary: per check out/sf      Allergies Added:   CC:  no complaints.  History of Present Illness: [name] [name] has a distant history of IMI in 3 in [place].  His last myovue in 2006 was nonischemic.  He is extremely active with no angina.  He works outside all the time and walks on a treadmill 4 times a week.  He has had recurrent problems with presumed "occular migraines".  He has scintilations in the left eye and then visual disturbance. He has had two episodes since I last saw him lasting about 15 minutes.  He has seen Dr Dione Booze and has no intrinisic eye problem.  I told him I thought he needed and echo with bubble study to R/O PFO.    Current Problems (verified): 1)  Unspecified Transient Cerebral Ischemia  (ICD-435.9) 2)  Myocardial Infarction, Hx of  (ICD-412) 3)  Hypertension  (ICD-401.9) 4)  Hyperlipidemia  (ICD-272.4) 5)  Coronary Artery Disease  (ICD-414.00) 6)  Special Screening Malig Neoplasms Other Sites  (ICD-V76.49) 7)  Preventive Health Care  (ICD-V70.0) 8)  Hearing Loss, Right Ear  (ICD-389.9) 9)  Frozen Right Shoulder  (ICD-726.0) 10)  Atherosclerosis of Aorta  (ICD-440.0) 11)  Preventive Health Care  (ICD-V70.0) 12)  Inguinal Hernia, Right  (ICD-550.90) 13)  Abdominal Pain  (ICD-789.00) 14)  Epigastric Pain  (ICD-789.06) 15)  Family History of Alcoholism/addiction  (ICD-V61.41) 16)  Prostatitis, Chronic  (ICD-601.1) 17)  Benign Prostatic Hypertrophy  (ICD-600.00) 18)  Peptic Ulcer Disease  (ICD-533.90) 19)  Diverticulitis, Hx of  (ICD-V12.79) 20)  Allergic Rhinitis  (ICD-477.9)  Current Medications (verified): 1)  Fluticasone Propionate 50 Mcg/act Susp (Fluticasone Propionate) .... Spray 2 Spray Into Both  Nostrils Once A Day 2)  Lisinopril 5 Mg Tabs (Lisinopril) .... Take 1 Tablet By Mouth Once A Day 3)  Nexium 40 Mg Cpdr (Esomeprazole Magnesium) .... Take 1 Capsule By Mouth Once A Day 4)  Simvastatin 40 Mg Tabs (Simvastatin) .Marland Kitchen.. 1 By Mouth Once Daily 5)  Ecotrin Low  Strength 81 Mg  Tbec (Aspirin) .Marland Kitchen.. 1 By Mouth Qd  Allergies (verified): 1)  ! Pcn 2)  ! * Niaspan  Past History:  Past Medical History: Last updated: 04/05/2009 Current Problems:  MYOCARDIAL INFARCTION, HX OF (ICD-412)hx of - 1989 HYPERTENSION (ICD-401.9) HYPERLIPIDEMIA (ICD-272.4) CORONARY ARTERY DISEASE (ICD-414.00) PDA infarct in [place] failed angioplasty  [facility] 2006 infrerobasal infarct with no ischemia normal EF SPECIAL SCREENING MALIG NEOPLASMS OTHER SITES (ICD-V76.49) PREVENTIVE HEALTH CARE (ICD-V70.0) HEARING LOSS, RIGHT EAR (ICD-389.9) FROZEN RIGHT SHOULDER (ICD-726.0) ATHEROSCLEROSIS OF AORTA (ICD-440.0) PREVENTIVE HEALTH CARE (ICD-V70.0) INGUINAL HERNIA, RIGHT (ICD-550.90) ABDOMINAL PAIN (ICD-789.00) EPIGASTRIC PAIN (ICD-789.06) FAMILY HISTORY OF ALCOHOLISM/ADDICTION (ICD-V61.41) PROSTATITIS, CHRONIC (ICD-601.1) BENIGN PROSTATIC HYPERTROPHY (ICD-600.00) PEPTIC ULCER DISEASE (ICD-533.90) DIVERTICULITIS, HX OF (ICD-V12.79) ALLERGIC RHINITIS (ICD-477.9) hx of h pylori tx 1998 c-spine djd LS Spine DJD  Past Surgical History: Last updated: 07/12/2007 Inguinal herniorrhaphy Appendectomy s/p PTCA 1989  Family History: Last updated: 07/12/2007 Family History of Alcoholism/Addiction - father and brother father with lung cancer grandfather with laryngeal cancer, heart dz, HTN  Social History: Last updated: 03/05/2008 Married 3 children Former Smoker Alcohol use-yes 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   Vital Signs:  Patient profile:   65 year old male  Height:      67 inches Weight:      158 pounds BMI:     24.84 Pulse rate:   78 / minute Resp:     12 per minute BP sitting:   120 /  70  (left arm)  Vitals Entered By: Kem Parkinson (October 06, 2009 11:48 AM)  Physical Exam  General:  Affect appropriate Healthy:  appears stated age HEENT: normal Neck supple with no adenopathy JVP normal no bruits no thyromegaly Lungs clear with no wheezing and good diaphragmatic motion Heart:  S1/S2 no murmur,rub, gallop or click PMI normal Abdomen: benighn, BS positve, no tenderness, no AAA no bruit.  No HSM or HJR Distal pulses intact with no bruits No edema Neuro non-focal Skin warm and dry    Impression & Recommendations:  Problem # 1:  UNSPECIFIED TRANSIENT CEREBRAL ISCHEMIA (ICD-435.9) Echo with bubble study.  Cartotids reviewed and normal.  Continue antiplatlet Rx  Problem # 2:  HYPERTENSION (ICD-401.9)  Stable His updated medication list for this problem includes:    Lisinopril 5 Mg Tabs (Lisinopril) .Marland Kitchen... Take 1 tablet by mouth once a day    Ecotrin Low Strength 81 Mg Tbec (Aspirin) .Marland Kitchen... 1 by mouth qd  His updated medication list for this problem includes:    Lisinopril 5 Mg Tabs (Lisinopril) .Marland Kitchen... Take 1 tablet by mouth once a day    Ecotrin Low Strength 81 Mg Tbec (Aspirin) .Marland Kitchen... 1 by mouth qd  Problem # 3:  HYPERLIPIDEMIA (ICD-272.4) At goal with no side effects His updated medication list for this problem includes:    Simvastatin 40 Mg Tabs (Simvastatin) .Marland Kitchen... 1 by mouth once daily  CHOL: 165 (03/19/2009)   LDL: 90 (03/19/2009)   HDL: 41.80 (03/19/2009)   TG: 164.0 (03/19/2009)  Problem # 4:  CORONARY ARTERY DISEASE (ICD-414.00) Stable no angina His updated medication list for this problem includes:    Lisinopril 5 Mg Tabs (Lisinopril) .Marland Kitchen... Take 1 tablet by mouth once a day    Ecotrin Low Strength 81 Mg Tbec (Aspirin) .Marland Kitchen... 1 by mouth qd  Other Orders: Echocardiogram (Echo)  Patient Instructions: 1)  Your physician recommends that you schedule a follow-up appointment in: 6 MONTHS 2)  Your physician has requested that you have an  echocardiogram.  Echocardiography is a painless test that uses sound waves to create images of your heart. It provides your doctor with information about the size and shape of your heart and how well your heart's chambers and valves are working.  This procedure takes approximately one hour. There are no restrictions for this procedure.WITH BUBBLE STUDY

## 2010-08-02 NOTE — Assessment & Plan Note (Signed)
Summary: ROV/ABN STRESS TEST  Medications Added PRILOSEC 20 MG CPDR (OMEPRAZOLE) 1 tab by mouth once daily      Allergies Added:   CC:  follow up.  History of Present Illness: [name] [name] has a distant history of IMI in 45 in [place].  His last myovue in 2006 was nonischemic. He has been having indigestion like symptoms and left should pain with movement.  The indigestion has been persistant despite prilosec and nexium  Dr Jonny Ruiz ordered a myovue on him  I reviewed this.  He exercised for over 13 minutes with nonischemic ECG.  He had a large area of inferior ischemia on myovue.  I told the patient that this could represent insuficient collaterals.  He has a known total RCA from failed angioplasty in the late 80s.  The concern is progressive disease in the left arteries that supply the collaterals.  Risk of cath including stroke, contrast reaction, MI and bleeding discussed  Willing to proceed.  Will consider adding nitrates once anatomy defined.  Pending cath would recommend F/U endoscopy for GERD which I do not think is anginal equivaltent  Current Problems (verified): 1)  Pre-operative Cardiovascular Examination  (ICD-V72.81) 2)  Myocardial Perfusion Scan, With Stress Test, Abnormal  (ICD-794.39) 3)  Chest Pain  (ICD-786.50) 4)  Fatigue  (ICD-780.79) 5)  Prostatitis, Chronic  (ICD-601.1) 6)  Ocular Migraine  (ICD-346.80) 7)  Amnesia, Transient Global  (ICD-437.7) 8)  Ocular Migraine  (ICD-346.80) 9)  Unspecified Transient Cerebral Ischemia  (ICD-435.9) 10)  Myocardial Infarction, Hx of  (ICD-412) 11)  Hypertension  (ICD-401.9) 12)  Hyperlipidemia  (ICD-272.4) 13)  Coronary Artery Disease  (ICD-414.00) 14)  Special Screening Malig Neoplasms Other Sites  (ICD-V76.49) 15)  Preventive Health Care  (ICD-V70.0) 16)  Hearing Loss, Right Ear  (ICD-389.9) 17)  Frozen Right Shoulder  (ICD-726.0) 18)  Atherosclerosis of Aorta  (ICD-440.0) 19)  Preventive Health Care  (ICD-V70.0) 20)  Inguinal Hernia,  Right  (ICD-550.90) 21)  Abdominal Pain  (ICD-789.00) 22)  Epigastric Pain  (ICD-789.06) 23)  Family History of Alcoholism/addiction  (ICD-V61.41) 24)  Prostatitis, Chronic  (ICD-601.1) 25)  Benign Prostatic Hypertrophy  (ICD-600.00) 26)  Peptic Ulcer Disease  (ICD-533.90) 27)  Diverticulitis, Hx of  (ICD-V12.79) 28)  Allergic Rhinitis  (ICD-477.9)  Current Medications (verified): 1)  Fluticasone Propionate 50 Mcg/act Susp (Fluticasone Propionate) .... Spray 2 Spray Into Both  Nostrils Once A Day 2)  Lisinopril 5 Mg Tabs (Lisinopril) .... Take 1 Tablet By Mouth Once A Day 3)  Nexium 40 Mg Cpdr (Esomeprazole Magnesium) .... Take 1 Capsule By Mouth Once A Day 4)  Simvastatin 40 Mg Tabs (Simvastatin) .Marland Kitchen.. 1 By Mouth Once Daily 5)  Ecotrin Low Strength 81 Mg  Tbec (Aspirin) .Marland Kitchen.. 1 By Mouth Qd 6)  Prilosec 20 Mg Cpdr (Omeprazole) .Marland Kitchen.. 1 Tab By Mouth Once Daily  Allergies (verified): 1)  ! Pcn 2)  ! * Niaspan  Past History:  Past Medical History: Last updated: 05/23/2010 MYOCARDIAL INFARCTION, HX OF (ICD-412)hx of - 1989 HYPERTENSION (ICD-401.9) HYPERLIPIDEMIA (ICD-272.4) CORONARY ARTERY DISEASE (ICD-414.00) PDA infarct in [place] failed angioplasty  [facility] 2006 infrerobasal infarct with no ischemia normal EF PREVENTIVE HEALTH CARE (ICD-V70.0) HEARING LOSS, RIGHT EAR (ICD-389.9) ATHEROSCLEROSIS OF AORTA (ICD-440.0) INGUINAL HERNIA, RIGHT (ICD-550.90) PROSTATITIS, CHRONIC (ICD-601.1) BENIGN PROSTATIC HYPERTROPHY (ICD-600.00) PEPTIC ULCER DISEASE (ICD-533.90) DIVERTICULITIS, HX OF (ICD-V12.79) ALLERGIC RHINITIS (ICD-477.9) hx of h pylori tx 1998 c-spine djd LS Spine DJD transient global amnesia Benign prostatic hypertrophy ocular migraine bilateral hearing loss,  mod to severe , right > left; has hearing aids  Past Surgical History: Last updated: 07/12/2007 Inguinal herniorrhaphy Appendectomy s/p PTCA 1989  Family History: Last updated: 07/12/2007 Family History of  Alcoholism/Addiction - father and brother father with lung cancer grandfather with laryngeal cancer, heart dz, HTN  Social History: Last updated: 04/27/2010 Married 3 children Former Smoker 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   Vital Signs:  Patient profile:   65 year old male Height:      67 inches Weight:      158 pounds BMI:     24.84 Pulse rate:   88 / minute Resp:     14 per minute BP sitting:   127 / 71  (left arm)  Vitals Entered By: Kem Parkinson (June 03, 2010 8:44 AM)  Physical Exam  General:  Affect appropriate Healthy:  appears stated age HEENT: normal Neck supple with no adenopathy JVP normal no bruits no thyromegaly Lungs clear with no wheezing and good diaphragmatic motion Heart:  S1/S2 no murmur,rub, gallop or click PMI normal Abdomen: benighn, BS positve, no tenderness, no AAA no bruit.  No HSM or HJR Distal pulses intact with no bruits No edema Neuro non-focal Skin warm and dry    Impression & Recommendations:  Problem # 1:  MYOCARDIAL PERFUSION SCAN, WITH STRESS TEST, ABNORMAL (ICD-794.39) Known old MI with failed angioplasty and collaterals to RCA.  ? Progressive disease on left.  Cath next Thursday  Problem # 2:  OCULAR MIGRAINE (ICD-346.80) Improved with no shunt by bubble study His updated medication list for this problem includes:    Ecotrin Low Strength 81 Mg Tbec (Aspirin) .Marland Kitchen... 1 by mouth qd  Problem # 3:  HYPERTENSION (ICD-401.9) Well controlled His updated medication list for this problem includes:    Lisinopril 5 Mg Tabs (Lisinopril) .Marland Kitchen... Take 1 tablet by mouth once a day    Ecotrin Low Strength 81 Mg Tbec (Aspirin) .Marland Kitchen... 1 by  mouth qd  Problem # 4:  HYPERLIPIDEMIA (ICD-272.4) At goal with no side effects His updated medication list for this problem includes:    Simvastatin 40 Mg Tabs (Simvastatin) .Marland Kitchen... 1 by mouth once daily  CHOL: 201 (04/27/2010)   LDL: 90 (03/19/2009)   HDL: 43.60 (04/27/2010)   TG: 217.0 (04/27/2010)  Problem # 5:  EPIGASTRIC PAIN (ICD-789.06) Continue H2 blocker  Recommend EGD once coronary anatomy defined  Other Orders: TLB-BMP (Basic Metabolic Panel-BMET) (80048-METABOL) TLB-CBC Platelet - w/Differential (85025-CBCD) TLB-PTT (85730-PTTL) TLB-PT (Protime) (85610-PTP)  Patient Instructions: 1)  Your physician recommends that you schedule a follow-up appointment in: 4 WEEKS WITH DR Eden Emms 2)  Your physician recommends that you return for lab work [id number] BMET CBC PT PTT V72.81 3)  Your physician recommends that you continue on your current medications as directed. Please refer to the Current Medication list given to you today. 4)  Your physician has requested that you have a cardiac catheterization.  Cardiac catheterization is used to diagnose and/or treat various heart conditions. Doctors may recommend this procedure for a number of different reasons. The most common reason is to evaluate chest pain. Chest pain can be a symptom of coronary artery disease (CAD), and cardiac catheterization can show whether plaque is narrowing or  blocking your heart's arteries. This procedure is also used to evaluate the valves, as well as measure the blood flow and oxygen levels in different parts of your heart.  For further information please visit [email or web address]  Please follow instruction sheet, as given.

## 2010-08-02 NOTE — Miscellaneous (Signed)
Summary: Immunization Entry   Immunization History:  Influenza Immunization History:    Influenza:  historical (03/24/2010)  [facility], [address] [place], [place] Vaccine, Fluvirin Dose, 0.71ml Site, left Deltoid, [employer] Date Administered 03/24/2010 Lot # [id number]

## 2010-08-02 NOTE — Procedures (Signed)
Summary: EGD/Dan Fabio Neighbors MD  EGD/Dan Fabio Neighbors MD   Imported By: Lester Greenwood 05/31/2010 09:20:56  _____________________________________________________________________  External Attachment:    Type:   Image     Comment:   External Document

## 2010-08-02 NOTE — Miscellaneous (Signed)
Summary: IV for Echo Bubble Study  Clinical Lists Changes     IV 20G (R) AC for Echo Bubble Study.Zakariah Urwin,RN 

## 2010-08-04 NOTE — Assessment & Plan Note (Signed)
Summary: per check out/sf  Medications Added FLUTICASONE PROPIONATE 50 MCG/ACT SUSP (FLUTICASONE PROPIONATE) Spray 2 spray into both  nostrils once a day as needed ASPIRIN EC 325 MG TBEC (ASPIRIN) Take one tablet by mouth daily EFFIENT 10 MG TABS (PRASUGREL HCL) Take one tablet by mouth daily CITRUCEL 500 MG TABS (METHYLCELLULOSE (LAXATIVE)) 2 once daily MOTRIN IB 200 MG TABS (IBUPROFEN) as needed      Allergies Added:   Visit Type:  Follow-up Primary Provider:  Corwin Levins MD  CC:  no complaints.  History of Present Illness: Nezar is seen today for F/U of CAD, elevated lipids and HTN.  He had a stent to the OM in early December with known occluded RCA with collaterals  LAD 50%.  Had recurrent pain post stent and repeat cath from right radial showed no issues with stent.  Has been taking Effient and ASA.  He has significant anxiety.  With his collaterals and atypical symptoms it will be difficult to follow him going forward.  He has a F/U appt with gi for his GERD although some of his indigestion may be an anginal equivalent.  Compliant with meds.    Current Problems (verified): 1)  Pre-operative Cardiovascular Examination  (ICD-V72.81) 2)  Myocardial Perfusion Scan, With Stress Test, Abnormal  (ICD-794.39) 3)  Chest Pain  (ICD-786.50) 4)  Fatigue  (ICD-780.79) 5)  Prostatitis, Chronic  (ICD-601.1) 6)  Ocular Migraine  (ICD-346.80) 7)  Amnesia, Transient Global  (ICD-437.7) 8)  Ocular Migraine  (ICD-346.80) 9)  Unspecified Transient Cerebral Ischemia  (ICD-435.9) 10)  Myocardial Infarction, Hx of  (ICD-412) 11)  Hypertension  (ICD-401.9) 12)  Hyperlipidemia  (ICD-272.4) 13)  Coronary Artery Disease  (ICD-414.00) 14)  Special Screening Malig Neoplasms Other Sites  (ICD-V76.49) 15)  Preventive Health Care  (ICD-V70.0) 16)  Hearing Loss, Right Ear  (ICD-389.9) 17)  Frozen Right Shoulder  (ICD-726.0) 18)  Atherosclerosis of Aorta  (ICD-440.0) 19)  Preventive Health Care   (ICD-V70.0) 20)  Inguinal Hernia, Right  (ICD-550.90) 21)  Abdominal Pain  (ICD-789.00) 22)  Epigastric Pain  (ICD-789.06) 23)  Family History of Alcoholism/addiction  (ICD-V61.41) 24)  Prostatitis, Chronic  (ICD-601.1) 25)  Benign Prostatic Hypertrophy  (ICD-600.00) 26)  Peptic Ulcer Disease  (ICD-533.90) 27)  Diverticulitis, Hx of  (ICD-V12.79) 28)  Allergic Rhinitis  (ICD-477.9)  Current Medications (verified): 1)  Fluticasone Propionate 50 Mcg/act Susp (Fluticasone Propionate) .... Spray 2 Spray Into Both  Nostrils Once A Day As Needed 2)  Lisinopril 5 Mg Tabs (Lisinopril) .... Take 1 Tablet By Mouth Once A Day 3)  Nexium 40 Mg Cpdr (Esomeprazole Magnesium) .... Take 1 Capsule By Mouth Once A Day 4)  Simvastatin 40 Mg Tabs (Simvastatin) .Marland Kitchen.. 1 By Mouth Once Daily 5)  Aspirin Ec 325 Mg Tbec (Aspirin) .... Take One Tablet By Mouth Daily 6)  Effient 10 Mg Tabs (Prasugrel Hcl) .... Take One Tablet By Mouth Daily 7)  Citrucel 500 Mg Tabs (Methylcellulose (Laxative)) .... 2 Once Daily 8)  Motrin Ib 200 Mg Tabs (Ibuprofen) .... As Needed  Allergies (verified): 1)  ! Pcn 2)  ! * Niaspan  Past History:  Past Medical History: Last updated: 05/23/2010 MYOCARDIAL INFARCTION, HX OF (ICD-412)hx of - 1989 HYPERTENSION (ICD-401.9) HYPERLIPIDEMIA (ICD-272.4) CORONARY ARTERY DISEASE (ICD-414.00) PDA infarct in [place] failed angioplasty  [facility] 2006 infrerobasal infarct with no ischemia normal EF PREVENTIVE HEALTH CARE (ICD-V70.0) HEARING LOSS, RIGHT EAR (ICD-389.9) ATHEROSCLEROSIS OF AORTA (ICD-440.0) INGUINAL HERNIA, RIGHT (ICD-550.90) PROSTATITIS, CHRONIC (ICD-601.1) BENIGN  PROSTATIC HYPERTROPHY (ICD-600.00) PEPTIC ULCER DISEASE (ICD-533.90) DIVERTICULITIS, HX OF (ICD-V12.79) ALLERGIC RHINITIS (ICD-477.9) hx of h pylori tx 1998 c-spine djd LS Spine DJD transient global amnesia Benign prostatic hypertrophy ocular migraine bilateral hearing loss, mod to severe , right > left;  has hearing aids  Past Surgical History: Last updated: 07/12/2007 Inguinal herniorrhaphy Appendectomy s/p PTCA 1989  Family History: Last updated: 07/12/2007 Family History of Alcoholism/Addiction - father and brother father with lung cancer grandfather with laryngeal cancer, heart dz, HTN  Social History: Last updated: 04/27/2010 Married 3 children Former Smoker 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   Vital Signs:  Patient profile:   65 year old male Height:      67 inches Weight:      158 pounds BMI:     24.84 Pulse rate:   85 / minute BP sitting:   124 / 64  (left arm) Cuff size:   regular  Vitals Entered By: Hardin Negus, RMA (June 30, 2010 9:07 AM)  Physical Exam  General:  Affect appropriate Healthy:  appears stated age HEENT: normal Neck supple with no adenopathy JVP normal no bruits no thyromegaly Lungs clear with no wheezing and good diaphragmatic motion Heart:  S1/S2 no murmur,rub, gallop or click PMI normal Abdomen: benighn, BS positve, no tenderness, no AAA no bruit.  No HSM or HJR Distal pulses intact with no bruits No edema Neuro non-focal Skin warm and dry    Impression & Recommendations:  Problem # 1:  HYPERTENSION (ICD-401.9)  Well controlled His updated medication list for this problem includes:    Lisinopril 5 Mg Tabs (Lisinopril) .Marland Kitchen... Take 1 tablet by mouth once a day    Aspirin Ec 325 Mg Tbec (Aspirin) .Marland Kitchen... Take one tablet by mouth daily  His updated medication list for this problem includes:    Lisinopril 5 Mg Tabs (Lisinopril) .Marland Kitchen... Take 1 tablet by mouth once a day    Aspirin Ec 325 Mg Tbec (Aspirin) .Marland Kitchen... Take one tablet by  mouth daily  Problem # 2:  HYPERLIPIDEMIA (ICD-272.4) Myalgias with lipitor.  Labs in 8 weeks.  Consider adding CoEnzyme Q His updated medication list for this problem includes:    Simvastatin 40 Mg Tabs (Simvastatin) .Marland Kitchen... 1 by mouth once daily  Problem # 3:  MYOCARDIAL INFARCTION, HX OF (ICD-412) CAD with collaterized RCA and stent OM 12/11.  Effient for a year.   His updated medication list for this problem includes:    Lisinopril 5 Mg Tabs (Lisinopril) .Marland Kitchen... Take 1 tablet by mouth once a day    Aspirin Ec 325 Mg Tbec (Aspirin) .Marland Kitchen... Take one tablet by mouth daily    Effient 10 Mg Tabs (Prasugrel hcl) .Marland Kitchen... Take one tablet by mouth daily  Patient Instructions: 1)  Your physician recommends that you schedule a follow-up appointment in: 3 MONTHS WITH DR Eden Emms 2)  Your physician recommends that you continue on your current medications as directed. Please refer to the Current Medication list given to you today. 3)  Your physician recommends that you return for lab work in:8 WEEKS FASTING LIPID LIVER 272.4 V58.69

## 2010-08-04 NOTE — Cardiovascular Report (Signed)
Summary: Pre-Cath Orders  Pre-Cath Orders   Imported By: Marylou Mccoy 06/20/2010 15:00:22  _____________________________________________________________________  External Attachment:    Type:   Image     Comment:   External Document

## 2010-08-04 NOTE — Progress Notes (Signed)
Summary: Cardiology Phone Note - CP   Phone Note Call from Patient   Caller: Patient Summary of Call: Pt called today to report chest pain. He was d/c yesterday after stenting with DES to OM1. Woke up at 4 a.m. with substernal chest "burning" sensation, somewhat to the left side, without any associated sob/n/v/diaphoresis. SL NTG did not change pain. Nothing makes it worse or better. Advised pt to head to ER given recent stent, pt decided he wants to go to the freestanding center on [address]. Advised pt that if he needed to be admitted it would most likely be at [facility] and he should consider coming here directly but strongly wishes to proceed to [facility] center instead. Initial call taken by: Ronie Spies PA-C

## 2010-08-25 ENCOUNTER — Encounter: Payer: Self-pay | Admitting: Cardiovascular Disease

## 2010-08-26 ENCOUNTER — Encounter (INDEPENDENT_AMBULATORY_CARE_PROVIDER_SITE_OTHER): Payer: Self-pay | Admitting: *Deleted

## 2010-08-26 LAB — CONVERTED CEMR LAB
ALT: 17 units/L (ref 0–53)
Bilirubin, Direct: 0.1 mg/dL (ref 0.0–0.3)
Cholesterol: 156 mg/dL (ref 0–200)
Indirect Bilirubin: 0.3 mg/dL (ref 0.0–0.9)
LDL Cholesterol: 97 mg/dL (ref 0–99)
Total CHOL/HDL Ratio: 4.1
VLDL: 21 mg/dL (ref 0–40)

## 2010-08-30 NOTE — Letter (Signed)
Summary: [facility] - [facility]  [facility], Main Office  [address]   [place], [place] [address]   Phone: [phone]  Fax: [phone]     August 26, 2010 MRN: [id number]   [name] [name] [address] [place], [place]  [address]   Dear [name],  We have reviewed your cholesterol results.  They are as follows:     Total Cholesterol:    156 (Desirable: less than 200)       HDL  Cholesterol:     38  (Desirable: greater than 40 for men and 50 for women)       LDL Cholesterol:       97  (Desirable: less than 100 for low risk and less than 70 for moderate to high risk)       Triglycerides:       106  (Desirable: less than 150)  Our recommendations include:These numbers look good. Continue on the same medicine. Liver function is normal. Take care, Dr. Leanora Cover.    Call our office at the number listed above if you have any questions.  Lowering your LDL cholesterol is important, but it is only one of a large number of "risk factors" that may indicate that you are at risk for heart disease, stroke or other complications of hardening of the arteries.  Other risk factors include:   A.  Cigarette Smoking* B.  High Blood Pressure* C.  Obesity* D.   Low HDL Cholesterol (see yours above)* E.   Diabetes Mellitus (higher risk if your is uncontrolled) F.  Family history of premature heart disease G.  Previous history of stroke or cardiovascular disease    *These are risk factors YOU HAVE CONTROL OVER.  For more information, visit .  There is now evidence that lowering the TOTAL CHOLESTEROL AND LDL CHOLESTEROL can reduce the risk of heart disease.  The American Heart Association recommends the following guidelines for the treatment of elevated cholesterol:  1.  If there is now current heart disease and less than two risk factors, TOTAL CHOLESTEROL should be less than 200 and LDL CHOLESTEROL should be less than 100. 2.  If there is current heart disease or two  or more risk factors, TOTAL CHOLESTEROL should be less than 200 and LDL CHOLESTEROL should be less than 70.  A diet low in cholesterol, saturated fat, and calories is the cornerstone of treatment for elevated cholesterol.  Cessation of smoking and exercise are also important in the management of elevated cholesterol and preventing vascular disease.  Studies have shown that 30 to 60 minutes of physical activity most days can help lower blood pressure, lower cholesterol, and keep your weight at a healthy level.  Drug therapy is used when cholesterol levels do not respond to therapeutic lifestyle changes (smoking cessation, diet, and exercise) and remains unacceptably high.  If medication is started, it is important to have you levels checked periodically to evaluate the need for further treatment options.  Thank you,    [employer] Team

## 2010-09-10 ENCOUNTER — Encounter: Payer: Self-pay | Admitting: Cardiovascular Disease

## 2010-09-13 LAB — CARDIAC PANEL(CRET KIN+CKTOT+MB+TROPI)
CK, MB: 1.6 ng/mL (ref 0.3–4.0)
CK, MB: 1.9 ng/mL (ref 0.3–4.0)
Relative Index: INVALID (ref 0.0–2.5)
Relative Index: INVALID (ref 0.0–2.5)
Total CK: 83 U/L (ref 7–232)
Troponin I: 0.66 ng/mL (ref 0.00–0.06)

## 2010-09-13 LAB — CBC
HCT: 40.5 % (ref 39.0–52.0)
Hemoglobin: 13.3 g/dL (ref 13.0–17.0)
Hemoglobin: 14 g/dL (ref 13.0–17.0)
MCH: 30.7 pg (ref 26.0–34.0)
MCH: 31.7 pg (ref 26.0–34.0)
MCHC: 34.6 g/dL (ref 30.0–36.0)
MCHC: 35 g/dL (ref 30.0–36.0)
MCV: 91.3 fL (ref 78.0–100.0)
MCV: 91.6 fL (ref 78.0–100.0)
Platelets: 159 10*3/uL (ref 150–400)
Platelets: 184 10*3/uL (ref 150–400)
RBC: 4.33 MIL/uL (ref 4.22–5.81)
RBC: 4.42 MIL/uL (ref 4.22–5.81)
RDW: 12.6 % (ref 11.5–15.5)
WBC: 6.8 10*3/uL (ref 4.0–10.5)

## 2010-09-13 LAB — DIFFERENTIAL
Basophils Relative: 0 % (ref 0–1)
Eosinophils Absolute: 0 10*3/uL (ref 0.0–0.7)
Lymphs Abs: 1 10*3/uL (ref 0.7–4.0)
Monocytes Relative: 9 % (ref 3–12)
Neutro Abs: 6.1 10*3/uL (ref 1.7–7.7)
Neutrophils Relative %: 77 % (ref 43–77)

## 2010-09-13 LAB — BASIC METABOLIC PANEL
CO2: 32 mEq/L (ref 19–32)
Chloride: 104 mEq/L (ref 96–112)
Chloride: 105 mEq/L (ref 96–112)
Chloride: 105 mEq/L (ref 96–112)
Creatinine, Ser: 1.1 mg/dL (ref 0.4–1.5)
Creatinine, Ser: 1.1 mg/dL (ref 0.4–1.5)
GFR calc Af Amer: >60 mL/min (ref >60)
GFR calc Af Amer: >60 mL/min (ref >60)
GFR calc Af Amer: >60 mL/min (ref >60)
GFR calc non Af Amer: 59 mL/min — ABNORMAL LOW (ref >60)
GFR calc non Af Amer: >60 mL/min (ref >60)
Glucose, Bld: 110 mg/dL — ABNORMAL HIGH (ref 70–99)
Glucose, Bld: 95 mg/dL (ref 70–99)
Potassium: 3.5 mEq/L (ref 3.5–5.1)
Potassium: 3.6 mEq/L (ref 3.5–5.1)
Potassium: 3.7 mEq/L (ref 3.5–5.1)
Sodium: 142 mEq/L (ref 135–145)
Sodium: 142 mEq/L (ref 135–145)
Sodium: 142 mEq/L (ref 135–145)

## 2010-09-13 LAB — POCT CARDIAC MARKERS
CKMB, poc: 1.1 ng/mL (ref 1.0–8.0)
Myoglobin, poc: 48.1 ng/mL (ref 12–200)

## 2010-09-13 LAB — CK TOTAL AND CKMB (NOT AT ARMC): Total CK: 77 U/L (ref 7–232)

## 2010-09-13 LAB — HEPARIN LEVEL (UNFRACTIONATED)
Heparin Unfractionated: 0.21 IU/mL — ABNORMAL LOW (ref 0.30–0.70)
Heparin Unfractionated: 0.58 IU/mL (ref 0.30–0.70)

## 2010-09-26 ENCOUNTER — Ambulatory Visit (INDEPENDENT_AMBULATORY_CARE_PROVIDER_SITE_OTHER): Payer: Medicare Other | Admitting: Cardiovascular Disease

## 2010-09-26 ENCOUNTER — Encounter: Payer: Self-pay | Admitting: Cardiovascular Disease

## 2010-09-26 DIAGNOSIS — I2510 Atherosclerotic heart disease of native coronary artery without angina pectoris: Secondary | ICD-10-CM

## 2010-09-26 DIAGNOSIS — I10 Essential (primary) hypertension: Secondary | ICD-10-CM

## 2010-09-26 DIAGNOSIS — E785 Hyperlipidemia, unspecified: Secondary | ICD-10-CM

## 2010-09-26 DIAGNOSIS — R079 Chest pain, unspecified: Secondary | ICD-10-CM

## 2010-09-26 NOTE — Patient Instructions (Signed)
F/u in 6 mos with Dr. Eden Emms.Marland KitchenMarland KitchenMarland Kitchen

## 2010-09-26 NOTE — Progress Notes (Signed)
[name] [name] is seen today for F/U of CAD, elevated lipids and HTN.  He had a stent to the OM in early December 2011  with known occluded RCA with collaterals  LAD 50%.  Had recurrent pain post stent and repeat cath from right radial showed no issues with stent.  Has been taking Effient and ASA.  He has significant anxiety.  With his collaterals and atypical symptoms it will be difficult to follow him going forward.  He has a F/U appt with gi for his GERD although some of his indigestion may be an anginal equivalent.  Compliant with meds.  Has trip to [place] to visit daughter coming up.    ROS: Denies fever, malais, weight loss, blurry vision, decreased visual acuity, cough, sputum, SOB, hemoptysis, pleuritic pain, palpitaitons, heartburn, abdominal pain, melena, lower extremity edema, claudication, or rash.   General: Affect appropriate Healthy:  appears stated age HEENT: normal Neck supple with no adenopathy JVP normal no bruits no thyromegaly Lungs clear with no wheezing and good diaphragmatic motion Heart:  S1/S2 SEM murmur no ,rub, gallop or click PMI normal Abdomen: benighn, BS positve, no tenderness, no AAA no bruit.  No HSM or HJR Distal pulses intact with no bruits No edema Neuro non-focal Skin warm and dry No muscular weakness   Current Outpatient Prescriptions  Medication Sig Dispense Refill  . aspirin 325 MG tablet Take 325 mg by mouth daily.        Marland Kitchen esomeprazole (NEXIUM) 40 MG capsule Take 40 mg by mouth daily before breakfast.        . fluticasone (FLONASE) 50 MCG/ACT nasal spray 2 sprays by Nasal route daily.        Marland Kitchen ibuprofen (ADVIL,MOTRIN) 200 MG tablet Take 200 mg by mouth every 6 (six) hours as needed.        Marland Kitchen lisinopril (PRINIVIL,ZESTRIL) 5 MG tablet Take 5 mg by mouth daily.        . Methylcellulose, Laxative, (CITRUCEL) 500 MG TABS 1 tab po bid       . prasugrel (EFFIENT) 10 MG TABS Take 10 mg by mouth daily.        . simvastatin (ZOCOR) 40 MG tablet Take 40 mg by  mouth at bedtime.        Marland Kitchen DISCONTD: Methylcellulose, Laxative, 500 MG TABS Take 2 tablets by mouth daily.         Allergies  Niacin and Penicillins  Electrocardiogram:  05/20/10  NSR Old IMI q's in 2,3,F  Assessment and Plan

## 2010-09-26 NOTE — Assessment & Plan Note (Signed)
Well controlled.  Continue current medications and low sodium Dash type diet.    

## 2010-09-26 NOTE — Assessment & Plan Note (Signed)
Cholesterol is at goal.  Continue current dose of statin and diet Rx.  No myalgias or side effects.  F/U  LFT's in 6 months. Lab Results  Component Value Date   LDLCALC 97 08/25/2010   

## 2010-09-26 NOTE — Assessment & Plan Note (Signed)
CAD with no angina.  Continue current medical Rx

## 2010-10-07 LAB — BASIC METABOLIC PANEL
BUN: 17 mg/dL (ref 6–23)
CO2: 32 mEq/L (ref 19–32)
Calcium: 9.5 mg/dL (ref 8.4–10.5)
Chloride: 101 mEq/L (ref 96–112)
Creatinine, Ser: 1 mg/dL (ref 0.4–1.5)
GFR calc Af Amer: >60 mL/min (ref >60)
GFR calc non Af Amer: >60 mL/min (ref >60)
Glucose, Bld: 125 mg/dL — ABNORMAL HIGH (ref 70–99)
Potassium: 4.2 mEq/L (ref 3.5–5.1)
Sodium: 139 mEq/L (ref 135–145)

## 2010-10-07 LAB — DIFFERENTIAL
Eosinophils Relative: 1 % (ref 0–5)
Lymphocytes Relative: 25 % (ref 12–46)
Lymphs Abs: 1.5 10*3/uL (ref 0.7–4.0)
Monocytes Absolute: 0.7 10*3/uL (ref 0.1–1.0)
Monocytes Relative: 11 % (ref 3–12)

## 2010-10-07 LAB — CBC
HCT: 43.7 % (ref 39.0–52.0)
Hemoglobin: 14.8 g/dL (ref 13.0–17.0)
MCHC: 34 g/dL (ref 30.0–36.0)
MCV: 93 fL (ref 78.0–100.0)
Platelets: 210 10*3/uL (ref 150–400)
RBC: 4.7 MIL/uL (ref 4.22–5.81)
RDW: 11.8 % (ref 11.5–15.5)
WBC: 6.2 10*3/uL (ref 4.0–10.5)

## 2010-11-15 NOTE — Assessment & Plan Note (Signed)
[facility]                         GASTROENTEROLOGY OFFICE NOTE   NAME:[name], [name]                           MRN:          [id number]  DATE:07/08/2007                            DOB:          06-07-1946    This is a return office visit for intermittent mild left lower quadrant  pain.  He states his symptoms worsened while he was on glycopyrrolate  and pantoprazole so he discontinued both.  He resumed Nexium which  helped but did not completely alleviate his symptoms.  He has since  discontinued Nexium because he felt it led to a rash and urinary  hesitancy. He still notes intermittent difficulty with mild left mid  abdomen and left lower quadrant discomfort.  At times he feels the pain  may be related to gas as there is a moving sensation.  A CT scan of the  abdomen and pelvis performed on November 6, showed a prostate nodule  which has been followed by Loraine Leriche C. Ottelin, M.D. and it showed a left  lower lobar calcified granuloma.  Gallstones and diverticulosis were  also noted.   ALLERGIES:  PENICILLIN, NIASPAN, and possible allergy to NEXIUM.   PHYSICAL EXAMINATION:  GENERAL:  No acute distress.  VITAL SIGNS:  Weight 159.8 pounds.  Blood pressure 124/72, pulse 80 and  regular. He is not reexamined.   ASSESSMENT:  Mild left-sided abdominal pain, possible intestinal gas  versus a non gastrointestinal etiology.  He may use over-the-counter  ranitidine 150 mg b.i.d. p.r.n.  He is advised to begin a low gas diet  and begin a trial of Gas-X q.i.d.  He is also advised to begin a lactose-  free diet for two weeks.  I have advised him to follow up with Corwin Levins, M.D. regarding his left lower lobar granuloma and Dr. Vernie Ammons  regarding his prostate nodule.  Return office visit with me as needed.  Ongoing follow-up with Dr. Oliver Barre.     Venita Lick. Russella Dar, MD, [facility]  Electronically Signed    MTS/MedQ  DD: 07/08/2007  DT: 07/08/2007  Job #:  [id number]   cc:   Corwin Levins, MD

## 2010-11-15 NOTE — Assessment & Plan Note (Signed)
[facility]                         GASTROENTEROLOGY OFFICE NOTE   NAME:[name] [name], [name] [name]                           MRN:          [id number]  DATE:05/06/2007                            DOB:          May 13, 1946    REASON FOR CONSULT:  Left lower quadrant pain.   HISTORY OF PRESENT ILLNESS:  Mr. [name] [name] is a 65 year old white male who  relates a 59-month history of recurrent left lower quadrant pain.  He  states he wakes up to urinate about twice a night and he frequently  notes an aching, burning left lower quadrant pain.  The pain in not  changed with urination. He does not generally have a bowel movement at  night.  Occasionally the symptoms are improved with meals and with bowel  movements.  Quite frequently the pain improves with changes in position  or may be exacerbated by the seated position.  He relates no change in  bowel habits, diarrhea, constipation, change in stool caliber, melena,  hematochezia or weight loss.  He was previously diagnosed with  Helicobacter pylori in 1998 that was treated.  He has records from  [facility] in [place], [place].  Endoscopy on December 23, 1999, showed mild gastritis and mild duodenitis.  Biopsies showed  chronic gastritis with no evidence of Helicobacter pylori.  Colonoscopy  on May 22, 2000, showed moderate sigmoid colon diverticulosis and  no other abnormalities.  He was having chronic left lower quadrant pain  in 2001 as well.  He had recurrent problems with epigastric pain and  underwent endoscopy on March 29, 2005, which showed mild gastritis.  Biopsies showed a fundic gland polyp with scant chronic inflammation.  Stains for Helicobacter pylori were negative.  He was evaluated by Dr.  Jonny Ruiz and treated with Nexium for several weeks with no change in his  symptoms.  Blood work from March 06, 2007, was unremarkable.  A  urinalysis was also unremarkable.  There is no family history of  colon  cancer, colon polyps or inflammatory bowel disease.  He states his last  hemoccult testing was in 2006 and it was negative.   PAST MEDICAL HISTORY:  1. Coronary artery disease.  2. Hyperlipidemia status post myocardial infarction in 1989.  3. History of Helicobacter pylori in 1998.  4. Gastritis.  5. Duodenitis.  6. Diverticulosis.  7. BPH.  8. Chronic prostatitis.  9. Bilateral epididymal cysts.  10.Status post right inguinal hernia repair 1991.  11.Status post angioplasty 1989.  12.Status post appendectomy in 1958.  13.Hyperlipidemia.  14.Status post angioplasty in 1989   CURRENT MEDICATIONS:  Listed on the chart updated and reviewed.   MEDICATION ALLERGIES:  1. PENICILLIN.  2. NIASPAN.   SOCIAL HISTORY AND REVIEW OF SYSTEMS:  Per the handwritten form.   PHYSICAL EXAMINATION:  Well-developed, well-nourished white male in no  acute distress.  Height 5 feet 8 inches, weight 158.4 pounds.  Blood  pressure is 108/60, pulse 84 and regular.  HEENT:  Anicteric sclerae, oropharynx clear.  NECK:  Without thyromegaly or adenopathy appreciated.  CHEST:  Clear to auscultation  bilaterally.  CARDIAC:  Regular rate and rhythm without murmurs appreciated.  ABDOMEN:  Soft, nontender, nondistended, normal active bowel sounds.  No  palpable organomegaly, masses, or hernias.  BACK:  Reveals no spinal or CVA tenderness.  RECTAL:  Deferred to time of colonoscopy.  EXTREMITIES:  Without clubbing, cyanosis, or edema.  NEUROLOGIC:  Alert and oriented x3.  Grossly nonfocal.   ASSESSMENT AND PLAN:  Left lower quadrant pain.  He notes these symptoms  have been present for 3 months but apparently he had similar problems  that were described as chronic in 2001.  He has  diverticulosis.  His  symptoms have features consistent with musculoskeletal symptoms.  Rule  out genitourinary symptoms and gastrointestinal symptoms.  Proceed with  a CT scan of the abdomen and pelvis and a colonoscopy.   Risks, benefits  and alternatives to colonoscopy with possible biopsy and possibly  polypectomy discussed with the patient and he consents to proceed.  This  will be scheduled electively.  If this evaluation is unremarkable I  would suggest further evaluation for radicular pain and a urology  evaluation.     Venita Lick. Russella Dar, MD, [facility]  Electronically Signed    MTS/MedQ  DD: 05/06/2007  DT: 05/06/2007  Job #: [id number]   cc:   Corwin Levins, MD

## 2010-11-15 NOTE — Assessment & Plan Note (Signed)
[facility]                            CARDIOLOGY OFFICE NOTE   NAME:[name], [name]                           MRN:          [id number]  DATE:03/12/2008                            DOB:          08-08-1945    A 65 year old patient referred for coronary disease and history of an  MI.  The patient is a retired 65 year old gentleman who had an MI back  in 19.  He was kind enough to bring records with him from [facility] [place] [place].  I read through these.  The patient  appeared to have a MI involving his PDA back in 1989.  There was a  failed angioplasty.   He did not have coronary disease elsewhere.  His last Myoview was in  2006, which showed a inferolateral wall infarct at mid and basal level  with preserved EF and no ischemia.  The patient has been asymptomatic.  He does describe diaphoresis and chest pain during the index event back  in 1989.   He has not had any recurrent chest pain, since he is active.  He walks  on a regular basis.  His activity of late has been limited by recent  back problem.  He does not get palpitations, syncope, PND, orthopnea, or  lower extremity edema.  His risk factors are fairly well modified.  His  LDL cholesterol is 89.  We had a discussion about this.  The patient  prefers to stay on simvastatin.  These has been on it for a long time.  He understands that a medication like Crestor may lower it further.  However, they also reports linking Crestor and Lipitor and some of the  stronger status with increased mental status changes and even possibly  [name].   I think since the patient has been asymptomatic for 20 years and his LDL  is in the range today as it is, his decision is okay.   His past medical history is otherwise remarkable for some lower back  problems status post appendectomy, previous hernia surgery.   His review of systems remarkable for some peptic ulcer disease with  positive H. pylori and some prostatitis.   Family history is remarkable for alcoholism in a brother and father.  Mother died at age 37 from coronary artery disease.  Father died at age  61 from alcoholism and lung cancer.   The patient is retired.  He moved here from [place].  He still has a  part-time job with an Counselling psychologist.  He was in the recycling  business previously.  He drinks on the weekends and does not smoke.  He  has 2 children in the area and 5 granddaughters.   CURRENT MEDICATIONS:  1. Simvastatin 40 a day.  2. Lisinopril 5 a day.  3. Nexium 40 a day.  4. Aspirin a day.   The patient is allergic to PENICILLIN.   PHYSICAL EXAMINATION:  GENERAL:  Remarkable for healthy-appearing middle-  aged white male in no distress.  VITAL SIGNS:  Weight is 155.  He  is in sinus rhythm at a rate of 89.  Blood pressure is equal to 130/80, respiratory rate 14, afebrile.  HEENT:  Unremarkable.  NECK:  Carotids are normal without bruit.  No lymphadenopathy,  thyromegaly, or JVP elevation.  LUNGS:  Clear to diaphragmatic motion.  No wheezing.  S1-S2 with normal  heart sounds.  PMI normal.  ABDOMEN:  Benign.  Bowel sounds positive.  No AAA.  No tenderness, no  bruit, no hepatosplenomegaly, or hepatojugular reflux.  No tenderness.  EXTREMITIES:  Distal pulses are intact.  No edema.  NEUROLOGIC:  Nonfocal.  SKIN:  Warm and dry.  He has pain in the lower lumbago area and is  wearing a harness.   His EKG is normal.   His lab work was remarkable for an LDL of 89, triglycerides of 148,  creatinine of 1.1, and normal LFTs.  Fasting TSH 1.27.  PSA 0.87.   IMPRESSION:  1. Old posterolateral wall myocardial infarction, preserved left      ventricular function.  Continue aspirin therapy.  2. Hypercholesterolemia.  Continue simvastatin and current dose for      the time being.  Consider more aggressive care in the future if he      slips with his diet.  Side effects from higher  dose statins would      indicate the patient wants to stick with his current drug.  3. Recent muscle strain in the lower back.  Continue to follow up with      Dr. Leotis Shames, analgesia, and mechanical support as necessary.   I will see him back in a year.  He has had a Myoview in 2006 and is  asymptomatic.  I do not think he needs one this year.     Noralyn Pick. Eden Emms, MD, [facility]  Electronically Signed    PCN/MedQ  DD: 03/12/2008  DT: 03/12/2008  Job #: [id number]   cc:   Corwin Levins, MD

## 2010-11-16 ENCOUNTER — Telehealth: Payer: Self-pay | Admitting: Cardiovascular Disease

## 2010-11-16 NOTE — Telephone Encounter (Signed)
Patient saw his  orthopedic Md today . they  prescribe him celebrex 200 mg one cap a day. Pt on Effient  10 mg . Is this O.K. For him to take.

## 2010-11-16 NOTE — Telephone Encounter (Signed)
Spoke with pt wife, explained to pt the increased risk with bleeding with effient and antiinflammatories, also the stomach issues with celebrex and with CAD. Pt encouraged to use tylenol or motrin sparingly and to watch for any increased bruising or bleeding problems Deliah Goody

## 2010-12-27 ENCOUNTER — Other Ambulatory Visit: Payer: Self-pay | Admitting: Nurse Practitioner

## 2010-12-27 MED ORDER — PRASUGREL HCL 10 MG PO TABS: 10.0000 mg | ORAL_TABLET | Freq: Every day | ORAL | Status: DC

## 2011-03-15 ENCOUNTER — Ambulatory Visit (INDEPENDENT_AMBULATORY_CARE_PROVIDER_SITE_OTHER): Payer: Medicare Other | Admitting: Cardiovascular Disease

## 2011-03-15 ENCOUNTER — Encounter: Payer: Self-pay | Admitting: Cardiovascular Disease

## 2011-03-15 DIAGNOSIS — I2510 Atherosclerotic heart disease of native coronary artery without angina pectoris: Secondary | ICD-10-CM

## 2011-03-15 DIAGNOSIS — I10 Essential (primary) hypertension: Secondary | ICD-10-CM

## 2011-03-15 DIAGNOSIS — E785 Hyperlipidemia, unspecified: Secondary | ICD-10-CM

## 2011-03-15 NOTE — Progress Notes (Signed)
[name] [name] is seen today for F/U of CAD, elevated lipids and HTN. He had a stent to the OM in early December 2011 with known occluded RCA with collaterals LAD 50%. Had recurrent pain post stent and repeat cath from right radial showed no issues with stent. Has been taking Effient and ASA. He has significant anxiety. With his collaterals and atypical symptoms it will be difficult to follow him going forward. He has a F/U appt with gi for his GERD although some of his indigestion may be an anginal equivalent. Compliant with meds. Has trip to [place] to visit daughter coming up.  He is interested in stopping Effient in January.  He does not get classic chest pain and I may be hesitant to do this.    Told him not to take Celebrex for right elbow and back pain  Prefer Tylenol and Tramadol   ROS: Denies fever, malais, weight loss, blurry vision, decreased visual acuity, cough, sputum, SOB, hemoptysis, pleuritic pain, palpitaitons, heartburn, abdominal pain, melena, lower extremity edema, claudication, or rash.  All other systems reviewed and negative  General: Affect appropriate Healthy:  appears stated age HEENT: normal Neck supple with no adenopathy JVP normal no bruits no thyromegaly Lungs clear with no wheezing and good diaphragmatic motion Heart:  S1/S2 no murmur,rub, gallop or click PMI normal Abdomen: benighn, BS positve, no tenderness, no AAA no bruit.  No HSM or HJR Distal pulses intact with no bruits No edema Neuro non-focal Skin warm and dry No muscular weakness   Current Outpatient Prescriptions  Medication Sig Dispense Refill  . aspirin 325 MG tablet Take 325 mg by mouth daily.        Marland Kitchen esomeprazole (NEXIUM) 40 MG capsule Take 40 mg by mouth daily before breakfast.        . fluticasone (FLONASE) 50 MCG/ACT nasal spray 2 sprays by Nasal route daily.        Marland Kitchen HYDROcodone-acetaminophen (NORCO) 5-325 MG per tablet As directed      . ibuprofen (ADVIL,MOTRIN) 200 MG tablet Take 200 mg by  mouth every 6 (six) hours as needed.        Marland Kitchen lisinopril (PRINIVIL,ZESTRIL) 5 MG tablet Take 5 mg by mouth daily.        . methocarbamol (ROBAXIN) 500 MG tablet As directed      . Methylcellulose, Laxative, (CITRUCEL) 500 MG TABS 1 tab po bid       . prasugrel (EFFIENT) 10 MG TABS Take 1 tablet (10 mg total) by mouth daily.  30 tablet  6  . simvastatin (ZOCOR) 40 MG tablet Take 40 mg by mouth at bedtime.          Allergies  Niacin and Penicillins  Electrocardiogram:    Assessment and Plan

## 2011-03-15 NOTE — Assessment & Plan Note (Signed)
Cholesterol is at goal.  Continue current dose of statin and diet Rx.  No myalgias or side effects.  F/U  LFT's in 6 months. Lab Results  Component Value Date   LDLCALC 97 08/25/2010

## 2011-03-15 NOTE — Assessment & Plan Note (Signed)
Well controlled.  Continue current medications and low sodium Dash type diet.    

## 2011-03-15 NOTE — Assessment & Plan Note (Signed)
Stable with no angina and good activity level.  Continue medical Rx Consider stopping Effient in January

## 2011-03-15 NOTE — Patient Instructions (Signed)
Your physician recommends that you schedule a follow-up appointment in: 6 MONTHS WITH DR NISHAN  Your physician recommends that you continue on your current medications as directed. Please refer to the Current Medication list given to you today. 

## 2011-03-17 ENCOUNTER — Telehealth: Payer: Self-pay | Admitting: Cardiovascular Disease

## 2011-03-17 NOTE — Telephone Encounter (Signed)
Pt having mri 9-19 next week,  Pt has stent, wants to confirm this is ok

## 2011-03-17 NOTE — Telephone Encounter (Signed)
Called patient and advised that it is OK for him to have an MRI. Had stent placed in December.

## 2011-04-30 ENCOUNTER — Encounter: Payer: Self-pay | Admitting: Internal Medicine

## 2011-04-30 DIAGNOSIS — Z0000 Encounter for general adult medical examination without abnormal findings: Secondary | ICD-10-CM | POA: Insufficient documentation

## 2011-05-01 ENCOUNTER — Other Ambulatory Visit (INDEPENDENT_AMBULATORY_CARE_PROVIDER_SITE_OTHER): Payer: Medicare Other

## 2011-05-01 ENCOUNTER — Encounter: Payer: Self-pay | Admitting: Internal Medicine

## 2011-05-01 ENCOUNTER — Ambulatory Visit (INDEPENDENT_AMBULATORY_CARE_PROVIDER_SITE_OTHER): Payer: Medicare Other | Admitting: Internal Medicine

## 2011-05-01 VITALS — BP 122/70 | HR 82 | Temp 98.1°F | Ht 67.0 in | Wt 160.1 lb

## 2011-05-01 DIAGNOSIS — F419 Anxiety disorder, unspecified: Secondary | ICD-10-CM

## 2011-05-01 DIAGNOSIS — F411 Generalized anxiety disorder: Secondary | ICD-10-CM

## 2011-05-01 DIAGNOSIS — N320 Bladder-neck obstruction: Secondary | ICD-10-CM

## 2011-05-01 DIAGNOSIS — R5381 Other malaise: Secondary | ICD-10-CM

## 2011-05-01 DIAGNOSIS — E785 Hyperlipidemia, unspecified: Secondary | ICD-10-CM

## 2011-05-01 DIAGNOSIS — R5383 Other fatigue: Secondary | ICD-10-CM

## 2011-05-01 DIAGNOSIS — I10 Essential (primary) hypertension: Secondary | ICD-10-CM

## 2011-05-01 HISTORY — DX: Anxiety disorder, unspecified: F41.9

## 2011-05-01 LAB — CBC WITH DIFFERENTIAL/PLATELET
Basophils Absolute: 0 10*3/uL (ref 0.0–0.1)
Eosinophils Absolute: 0 10*3/uL (ref 0.0–0.7)
HCT: 42.3 % (ref 39.0–52.0)
Hemoglobin: 14.4 g/dL (ref 13.0–17.0)
Lymphs Abs: 1.3 10*3/uL (ref 0.7–4.0)
MCHC: 34.1 g/dL (ref 30.0–36.0)
Monocytes Relative: 10.6 % (ref 3.0–12.0)
Neutro Abs: 3.5 10*3/uL (ref 1.4–7.7)
RDW: 12.6 % (ref 11.5–14.6)

## 2011-05-01 LAB — URINALYSIS, ROUTINE W REFLEX MICROSCOPIC
Ketones, ur: NEGATIVE
Specific Gravity, Urine: 1.02 (ref 1.000–1.030)
Urine Glucose: NEGATIVE
Urobilinogen, UA: 0.2 (ref 0.0–1.0)

## 2011-05-01 LAB — LIPID PANEL
Cholesterol: 176 mg/dL (ref 0–200)
HDL: 51.7 mg/dL (ref >39.00)
Total CHOL/HDL Ratio: 3
Triglycerides: 122 mg/dL (ref 0.0–149.0)

## 2011-05-01 LAB — BASIC METABOLIC PANEL
BUN: 17 mg/dL (ref 6–23)
CO2: 30 mEq/L (ref 19–32)
Chloride: 104 mEq/L (ref 96–112)
Creatinine, Ser: 1.1 mg/dL (ref 0.4–1.5)

## 2011-05-01 LAB — HEPATIC FUNCTION PANEL
Alkaline Phosphatase: 50 U/L (ref 39–117)
Bilirubin, Direct: 0.1 mg/dL (ref 0.0–0.3)
Total Bilirubin: 0.6 mg/dL (ref 0.3–1.2)

## 2011-05-01 MED ORDER — ESOMEPRAZOLE MAGNESIUM 40 MG PO CPDR: 40.0000 mg | DELAYED_RELEASE_CAPSULE | Freq: Every day | ORAL | Status: DC

## 2011-05-01 NOTE — Patient Instructions (Signed)
Take all new medications as prescribed Please go to LAB in the Basement for the blood and/or urine tests to be done today Please call the phone number [phone] (the [employer]Tree System) for results of testing in 2-3 days;  When calling, simply dial the number, and when prompted enter the MRN number above (the Medical Record Number) and the # key, then the message should start.th Please return in 1 year for your yearly visit, or sooner if needed

## 2011-05-04 ENCOUNTER — Encounter: Payer: Self-pay | Admitting: Internal Medicine

## 2011-05-04 NOTE — Assessment & Plan Note (Signed)
Etiology unclear, Exam otherwise benign, to check labs as documented, follow with expectant management  

## 2011-05-04 NOTE — Assessment & Plan Note (Signed)
Also for ua and psa as he is due,  to f/u any worsening symptoms or concerns , asympt

## 2011-05-04 NOTE — Assessment & Plan Note (Signed)
stable overall by hx and exam, most recent data reviewed with pt, and pt to continue medical treatment as before  Lab Results  Component Value Date   LDLCALC 100* 05/01/2011

## 2011-05-04 NOTE — Assessment & Plan Note (Signed)
stable overall by hx and exam, most recent data reviewed with pt, and pt to continue medical treatment as before  BP Readings from Last 3 Encounters:  05/01/11 122/70  03/15/11 128/73  09/26/10 107/66

## 2011-05-04 NOTE — Assessment & Plan Note (Signed)
stable overall by hx and exam, most recent data reviewed with pt, and pt to continue medical treatment as before  Lab Results  Component Value Date   WBC 5.5 05/01/2011   HGB 14.4 05/01/2011   HCT 42.3 05/01/2011   PLT 197.0 05/01/2011   GLUCOSE 102* 05/01/2011   CHOL 176 05/01/2011   TRIG 122.0 05/01/2011   HDL 51.70 05/01/2011   LDLDIRECT 116.9 04/27/2010   LDLCALC 100* 05/01/2011   ALT 27 05/01/2011   AST 27 05/01/2011   NA 142 05/01/2011   K 4.5 05/01/2011   CL 104 05/01/2011   CREATININE 1.1 05/01/2011   BUN 17 05/01/2011   CO2 30 05/01/2011   TSH 2.59 05/01/2011   PSA 0.99 05/01/2011   INR 1.0 ratio 06/03/2010   HGBA1C 5.7 03/19/2009

## 2011-05-04 NOTE — Progress Notes (Signed)
Subjective:    Patient ID: [name] [name], male    DOB: 1946/05/01, 65 y.o.   MRN: [id number]  HPI  Here to f/u; overall doing ok,  Pt denies chest pain, increased sob or doe, wheezing, orthopnea, PND, increased LE swelling, palpitations, dizziness or syncope.  Pt denies new neurological symptoms such as new headache, or facial or extremity weakness or numbness   Pt denies polydipsia, polyuria, or low sugar symptoms such as weakness or confusion improved with po intake.  Pt states overall good compliance with meds, trying to follow lower cholesterol diet, wt overall stable but little exercise however.  Does have sense of ongoing fatigue, but denies signficant hypersomnolence.  Denies worsening depressive symptoms, suicidal ideation, or panic, though has ongoing anxiety, not increased recently.  Past Medical History  Diagnosis Date  . Old myocardial infarction 1989  . HTN (hypertension)   . HLD (hyperlipidemia)   . CAD (coronary artery disease)   . Hearing loss in right ear   . Atherosclerosis of aorta   . Inguinal hernia without mention of obstruction or gangrene, unilateral or unspecified, (not specified as recurrent)   . Chronic prostatitis   . BPH (benign prostatic hyperplasia)   . PUD (peptic ulcer disease)   . Diverticulitis   . Allergic rhinitis   . DJD (degenerative joint disease), lumbosacral   . Transient amnesia   . Ocular migraine   . Anxiety 05/01/2011   Past Surgical History  Procedure Date  . Inguinal hernia repair   . Appendectomy   . Coronary stent placement     reports that he has quit smoking. He does not have any smokeless tobacco history on file. He reports that he drinks alcohol. He reports that he does not use illicit drugs. family history includes Alcohol abuse in his brother and father; Cancer in his father and unspecified family member; Heart disease in an unspecified family member; and Hypertension in an unspecified family member. Allergies  Allergen Reactions    . Niacin   . Penicillins    Current Outpatient Prescriptions on File Prior to Visit  Medication Sig Dispense Refill  . aspirin 325 MG tablet Take 325 mg by mouth daily.        . fluticasone (FLONASE) 50 MCG/ACT nasal spray 2 sprays by Nasal route daily.        Marland Kitchen HYDROcodone-acetaminophen (NORCO) 5-325 MG per tablet As directed      . ibuprofen (ADVIL,MOTRIN) 200 MG tablet Take 200 mg by mouth every 6 (six) hours as needed.        Marland Kitchen lisinopril (PRINIVIL,ZESTRIL) 5 MG tablet Take 5 mg by mouth daily.        . methocarbamol (ROBAXIN) 500 MG tablet As directed      . Methylcellulose, Laxative, (CITRUCEL) 500 MG TABS 1 tab po bid       . prasugrel (EFFIENT) 10 MG TABS Take 1 tablet (10 mg total) by mouth daily.  30 tablet  6  . simvastatin (ZOCOR) 40 MG tablet Take 40 mg by mouth at bedtime.         Review of Systems Review of Systems  Constitutional: Negative for diaphoresis and unexpected weight change.  HENT: Negative for drooling and tinnitus.   Eyes: Negative for photophobia and visual disturbance.  Respiratory: Negative for choking and stridor.   Gastrointestinal: Negative for vomiting and blood in stool.  Genitourinary: Negative for hematuria and decreased urine volume.  Musculoskeletal: Negative for gait problem.  Skin: Negative for color change  and wound.  Neurological: Negative for tremors and numbness.  Psychiatric/Behavioral: Negative for decreased concentration. The patient is not hyperactive.       Objective:   Physical Exam BP 122/70  Pulse 82  Temp(Src) 98.1 F (36.7 C) (Oral)  Ht 5\' 7"  (1.702 m)  Wt 160 lb 2 oz (72.632 kg)  BMI 25.08 kg/m2  SpO2 98% Physical Exam  VS noted Constitutional: Pt appears well-developed and well-nourished.  HENT: Head: Normocephalic.  Right Ear: External ear normal.  Left Ear: External ear normal.  Eyes: Conjunctivae and EOM are normal. Pupils are equal, round, and reactive to light.  Neck: Normal range of motion. Neck supple.   Cardiovascular: Normal rate and regular rhythm.   Pulmonary/Chest: Effort normal and breath sounds normal.  Abd:  Soft, NT, non-distended, + BS Neurological: Pt is alert. No cranial nerve deficit.  Skin: Skin is warm. No erythema. no bruising or bleeding Psychiatric: Pt behavior is normal. Thought content normal. 1+ nervous    Assessment & Plan:

## 2011-07-11 DIAGNOSIS — I781 Nevus, non-neoplastic: Secondary | ICD-10-CM | POA: Diagnosis not present

## 2011-07-11 DIAGNOSIS — L570 Actinic keratosis: Secondary | ICD-10-CM | POA: Diagnosis not present

## 2011-07-11 DIAGNOSIS — D235 Other benign neoplasm of skin of trunk: Secondary | ICD-10-CM | POA: Diagnosis not present

## 2011-07-24 DIAGNOSIS — H9190 Unspecified hearing loss, unspecified ear: Secondary | ICD-10-CM | POA: Diagnosis not present

## 2011-07-24 DIAGNOSIS — H6120 Impacted cerumen, unspecified ear: Secondary | ICD-10-CM | POA: Diagnosis not present

## 2011-07-31 ENCOUNTER — Other Ambulatory Visit: Payer: Self-pay | Admitting: Cardiovascular Disease

## 2011-09-05 ENCOUNTER — Encounter: Payer: Self-pay | Admitting: Internal Medicine

## 2011-09-05 ENCOUNTER — Ambulatory Visit (INDEPENDENT_AMBULATORY_CARE_PROVIDER_SITE_OTHER): Payer: Medicare Other | Admitting: Internal Medicine

## 2011-09-05 VITALS — BP 112/60 | HR 89 | Temp 98.8°F | Ht 68.0 in | Wt 160.0 lb

## 2011-09-05 DIAGNOSIS — H1033 Unspecified acute conjunctivitis, bilateral: Secondary | ICD-10-CM | POA: Insufficient documentation

## 2011-09-05 DIAGNOSIS — F411 Generalized anxiety disorder: Secondary | ICD-10-CM

## 2011-09-05 DIAGNOSIS — I10 Essential (primary) hypertension: Secondary | ICD-10-CM | POA: Diagnosis not present

## 2011-09-05 DIAGNOSIS — H1030 Unspecified acute conjunctivitis, unspecified eye: Secondary | ICD-10-CM | POA: Diagnosis not present

## 2011-09-05 DIAGNOSIS — F419 Anxiety disorder, unspecified: Secondary | ICD-10-CM

## 2011-09-05 DIAGNOSIS — J209 Acute bronchitis, unspecified: Secondary | ICD-10-CM | POA: Diagnosis not present

## 2011-09-05 MED ORDER — HYDROCODONE-HOMATROPINE 5-1.5 MG/5ML PO SYRP: 5.0000 mL | ORAL_SOLUTION | Freq: Four times a day (QID) | ORAL | Status: AC | PRN

## 2011-09-05 MED ORDER — TOBRAMYCIN 0.3 % OP SOLN: 1.0000 [drp] | Freq: Four times a day (QID) | OPHTHALMIC | Status: AC

## 2011-09-05 MED ORDER — AZITHROMYCIN 250 MG PO TABS: ORAL_TABLET | ORAL | Status: AC

## 2011-09-05 NOTE — Progress Notes (Signed)
Subjective:    Patient ID: [name] [name], male    DOB: [birth year], 66 y.o.   MRN: [id number]  HPI  Here to f/u with acute complaint;  Here with acute onset mild to mod 2-3 days ST, HA, general weakness and malaise, with prod cough greenish sputum, but Pt denies chest pain, increased sob or doe, wheezing, orthopnea, PND, increased LE swelling, palpitations, dizziness or syncope.  Also with rather signficant 2-3 days bilat conjunct erythema, itch, discomfort, d/c and AM matting.  No vision change.  Pt denies new neurological symptoms such as new headache, or facial or extremity weakness or numbness   Pt denies polydipsia, polyuria. Denies worsening depressive symptoms, suicidal ideation, or panic, though has ongoing anxiety, not increased recently.  Past Medical History  Diagnosis Date  . Old myocardial infarction 1989  . HTN (hypertension)   . HLD (hyperlipidemia)   . CAD (coronary artery disease)   . Hearing loss in right ear   . Atherosclerosis of aorta   . Inguinal hernia without mention of obstruction or gangrene, unilateral or unspecified, (not specified as recurrent)   . Chronic prostatitis   . BPH (benign prostatic hyperplasia)   . PUD (peptic ulcer disease)   . Diverticulitis   . Allergic rhinitis   . DJD (degenerative joint disease), lumbosacral   . Transient amnesia   . Ocular migraine   . Anxiety 05/01/2011   Past Surgical History  Procedure Date  . Inguinal hernia repair   . Appendectomy   . Coronary stent placement     reports that he has quit smoking. He does not have any smokeless tobacco history on file. He reports that he drinks alcohol. He reports that he does not use illicit drugs. family history includes Alcohol abuse in his brother and father; Cancer in his father and unspecified family member; Heart disease in an unspecified family member; and Hypertension in an unspecified family member. Allergies  Allergen Reactions  . Niacin   . Penicillins    Current  Outpatient Prescriptions on File Prior to Visit  Medication Sig Dispense Refill  . aspirin 325 MG tablet Take 325 mg by mouth daily.        Marland Kitchen EFFIENT 10 MG TABS TAKE 1 TABLET BY MOUTH DAILY  30 tablet  5  . esomeprazole (NEXIUM) 40 MG capsule Take 1 capsule (40 mg total) by mouth daily before breakfast.  90 capsule  3  . HYDROcodone-acetaminophen (NORCO) 5-325 MG per tablet As directed      . ibuprofen (ADVIL,MOTRIN) 200 MG tablet Take 200 mg by mouth every 6 (six) hours as needed.        Marland Kitchen lisinopril (PRINIVIL,ZESTRIL) 5 MG tablet Take 5 mg by mouth daily.        . Methylcellulose, Laxative, (CITRUCEL) 500 MG TABS 1 tab po bid       . simvastatin (ZOCOR) 40 MG tablet Take 40 mg by mouth at bedtime.        . fluticasone (FLONASE) 50 MCG/ACT nasal spray 2 sprays by Nasal route daily.        . methocarbamol (ROBAXIN) 500 MG tablet As directed       Review of Systems Review of Systems  Constitutional: Negative for diaphoresis and unexpected weight change.  HENT: Negative for drooling and tinnitus.   Eyes: Negative for photophobia and visual disturbance.  Respiratory: Negative for choking and stridor.   Gastrointestinal: Negative for vomiting and blood in stool.  Genitourinary: Negative for hematuria and decreased urine  volume.     Objective:   Physical Exam BP 112/60  Pulse 89  Temp(Src) 98.8 F (37.1 C) (Oral)  Ht 5\' 8"  (1.727 m)  Wt 160 lb (72.576 kg)  BMI 24.33 kg/m2  SpO2 96% Physical Exam  VS noted, mild ill Constitutional: Pt appears well-developed and well-nourished.  HENT: Head: Normocephalic.  Right Ear: External ear normal.  Left Ear: External ear normal.  Bilat tm's mild erythema.  Sinus nontender.  Pharynx mild erythema Eyes: Conjunctivae with severe erythema, mild d/c and puffiness bilat upper and lower eyelids. Pupils are equal, round, and reactive to light.  Neck: Normal range of motion. Neck supple.  Cardiovascular: Normal rate and regular rhythm.     Pulmonary/Chest: Effort normal and breath sounds normal.  Neurological: Pt is alert. No cranial nerve deficit.  Skin: Skin is warm. No erythema.  Psychiatric: Pt behavior is normal. Thought content normal. 1+ nervous, ? Somewhat dysphoric    Assessment & Plan:

## 2011-09-05 NOTE — Assessment & Plan Note (Signed)
stable overall by hx and exam, most recent data reviewed with pt, and pt to continue medical treatment as before  Lab Results  Component Value Date   WBC 5.5 05/01/2011   HGB 14.4 05/01/2011   HCT 42.3 05/01/2011   PLT 197.0 05/01/2011   GLUCOSE 102* 05/01/2011   CHOL 176 05/01/2011   TRIG 122.0 05/01/2011   HDL 51.70 05/01/2011   LDLDIRECT 116.9 04/27/2010   LDLCALC 100* 05/01/2011   ALT 27 05/01/2011   AST 27 05/01/2011   NA 142 05/01/2011   K 4.5 05/01/2011   CL 104 05/01/2011   CREATININE 1.1 05/01/2011   BUN 17 05/01/2011   CO2 30 05/01/2011   TSH 2.59 05/01/2011   PSA 0.99 05/01/2011   INR 1.0 ratio 06/03/2010   HGBA1C 5.7 03/19/2009    

## 2011-09-05 NOTE — Assessment & Plan Note (Signed)
stable overall by hx and exam, most recent data reviewed with pt, and pt to continue medical treatment as before  BP Readings from Last 3 Encounters:  09/05/11 112/60  05/01/11 122/70  03/15/11 128/73

## 2011-09-05 NOTE — Patient Instructions (Signed)
Take all new medications as prescribed Continue all other medications as before You can also take  Mucinex (or it's generic off brand) for congestion  

## 2011-09-05 NOTE — Assessment & Plan Note (Signed)
Mild to mod, for antibx course,  to f/u any worsening symptoms or concerns 

## 2011-09-07 ENCOUNTER — Ambulatory Visit: Payer: Medicare Other | Admitting: Cardiovascular Disease

## 2011-09-18 DIAGNOSIS — L259 Unspecified contact dermatitis, unspecified cause: Secondary | ICD-10-CM | POA: Diagnosis not present

## 2011-09-22 ENCOUNTER — Other Ambulatory Visit: Payer: Self-pay

## 2011-09-22 MED ORDER — ESOMEPRAZOLE MAGNESIUM 40 MG PO CPDR: 40.0000 mg | DELAYED_RELEASE_CAPSULE | Freq: Every day | ORAL | Status: DC

## 2011-09-22 NOTE — Telephone Encounter (Signed)
Pt called requesting hardcopy Rx for Nexium to be submitted to company for discounted rate. Rx printed, pt informed.

## 2011-09-28 ENCOUNTER — Encounter: Payer: Self-pay | Admitting: Cardiovascular Disease

## 2011-09-28 ENCOUNTER — Ambulatory Visit (INDEPENDENT_AMBULATORY_CARE_PROVIDER_SITE_OTHER): Payer: Medicare Other | Admitting: Cardiovascular Disease

## 2011-09-28 VITALS — BP 140/75 | HR 89 | Ht 67.0 in | Wt 162.1 lb

## 2011-09-28 DIAGNOSIS — F411 Generalized anxiety disorder: Secondary | ICD-10-CM | POA: Diagnosis not present

## 2011-09-28 DIAGNOSIS — E785 Hyperlipidemia, unspecified: Secondary | ICD-10-CM | POA: Diagnosis not present

## 2011-09-28 DIAGNOSIS — I10 Essential (primary) hypertension: Secondary | ICD-10-CM

## 2011-09-28 DIAGNOSIS — I2510 Atherosclerotic heart disease of native coronary artery without angina pectoris: Secondary | ICD-10-CM

## 2011-09-28 DIAGNOSIS — F419 Anxiety disorder, unspecified: Secondary | ICD-10-CM

## 2011-09-28 MED ORDER — NITROGLYCERIN 0.4 MG SL SUBL: 0.4000 mg | SUBLINGUAL_TABLET | SUBLINGUAL | Status: DC | PRN

## 2011-09-28 MED ORDER — METOPROLOL SUCCINATE ER 25 MG PO TB24: 25.0000 mg | ORAL_TABLET | Freq: Every day | ORAL | Status: DC

## 2011-09-28 NOTE — Assessment & Plan Note (Signed)
Stable with no angina and good activity level.  Continue medical Rx  

## 2011-09-28 NOTE — Assessment & Plan Note (Signed)
Cholesterol is at goal.  Continue current dose of statin and diet Rx.  No myalgias or side effects.  F/U  LFT's in 6 months. Lab Results  Component Value Date   LDLCALC 100* 05/01/2011

## 2011-09-28 NOTE — Assessment & Plan Note (Signed)
Will add Toprol 25 mg as he tends to be excitable and BP borderline with known CAD

## 2011-09-28 NOTE — Assessment & Plan Note (Signed)
Chronic issue especially dealing with diagnosis of CAD.  F/U Dr Jonny Ruiz

## 2011-09-28 NOTE — Progress Notes (Signed)
Jahrell is seen today for F/U of CAD, elevated lipids and HTN. He had a stent to the OM in early December 2011 with known occluded RCA with collaterals LAD 50%. Had recurrent pain post stent and repeat cath from right radial showed no issues with stent. Has been taking Effient and ASA. He has significant anxiety. With his collaterals and atypical symptoms it will be difficult to follow him going forward. He has a F/U appt with gi for his GERD although some of his indigestion may be an anginal equivalent. Compliant with meds. Has trip to [place] to visit daughter coming up. He is interested in stopping Effient in January. He does not get classic chest pain and I may be hesitant to do this.  Told him not to take Celebrex for right elbow and back pain Prefer Tylenol and Tramadol  ROS: Denies fever, malais, weight loss, blurry vision, decreased visual acuity, cough, sputum, SOB, hemoptysis, pleuritic pain, palpitaitons, heartburn, abdominal pain, melena, lower extremity edema, claudication, or rash.  All other systems reviewed and negative  General: Affect appropriate Healthy:  appears stated age HEENT: normal Neck supple with no adenopathy JVP normal no bruits no thyromegaly Lungs clear with no wheezing and good diaphragmatic motion Heart:  S1/S2 no murmur, no rub, gallop or click PMI normal Abdomen: benighn, BS positve, no tenderness, no AAA no bruit.  No HSM or HJR Distal pulses intact with no bruits No edema Neuro non-focal Skin warm and dry No muscular weakness   Current Outpatient Prescriptions  Medication Sig Dispense Refill  . aspirin 325 MG tablet Take 325 mg by mouth daily.        Marland Kitchen EFFIENT 10 MG TABS TAKE 1 TABLET BY MOUTH DAILY  30 tablet  5  . esomeprazole (NEXIUM) 40 MG capsule Take 1 capsule (40 mg total) by mouth daily before breakfast.  90 capsule  3  . fluticasone (FLONASE) 50 MCG/ACT nasal spray 2 sprays by Nasal route daily.        Marland Kitchen HYDROcodone-acetaminophen (NORCO)  5-325 MG per tablet As directed      . ibuprofen (ADVIL,MOTRIN) 200 MG tablet Take 200 mg by mouth every 6 (six) hours as needed.        Marland Kitchen lisinopril (PRINIVIL,ZESTRIL) 5 MG tablet Take 5 mg by mouth daily.        . methocarbamol (ROBAXIN) 500 MG tablet As directed      . Methylcellulose, Laxative, (CITRUCEL) 500 MG TABS 1 tab po bid       . simvastatin (ZOCOR) 40 MG tablet Take 40 mg by mouth at bedtime.          Allergies  Niacin and Penicillins  Electrocardiogram: NSR rate 83 Old IMI  Assessment and Plan

## 2011-09-28 NOTE — Patient Instructions (Addendum)
Your physician wants you to follow-up in: 6 MONTHS WITH DR Haywood Filler will receive a reminder letter in the mail two months in advance. If you don't receive a letter, please call our office to schedule the follow-up appointment. Your physician has recommended you make the following change in your medication: ADD TOPROL 25 MG  1 TAB EVERY DAY

## 2011-10-02 NOTE — Progress Notes (Signed)
Addended by: Reine Just on: 10/02/2011 01:57 PM   Modules accepted: Orders

## 2011-10-06 ENCOUNTER — Telehealth: Payer: Self-pay | Admitting: Internal Medicine

## 2011-10-06 MED ORDER — FLUTICASONE PROPIONATE 50 MCG/ACT NA SUSP: 2.0000 | Freq: Every day | NASAL | Status: DC

## 2011-10-06 NOTE — Telephone Encounter (Signed)
Patient called and would like flonase called into [facility] [address] [place]. [phone] is his call back number

## 2011-10-06 NOTE — Telephone Encounter (Signed)
flonase done to pharmacy  please be specific about med name he is needing to be printed, as I do not try to learn what pharm company makes what medicine

## 2011-10-06 NOTE — Telephone Encounter (Signed)
To complete the patient's patient assistance program, [employer] is requiring a printed rx sent to them for the medicines that he is needing.  I have the envelope and i'll be happy to mail them as soon as possible.  Thanks!

## 2011-10-06 NOTE — Telephone Encounter (Signed)
Faxed hardcopy to [facility] in [place] per pt. Request.

## 2011-11-21 DIAGNOSIS — M7750 Other enthesopathy of unspecified foot: Secondary | ICD-10-CM | POA: Diagnosis not present

## 2011-12-11 ENCOUNTER — Ambulatory Visit (INDEPENDENT_AMBULATORY_CARE_PROVIDER_SITE_OTHER): Payer: Medicare Other | Admitting: Internal Medicine

## 2011-12-11 ENCOUNTER — Encounter: Payer: Self-pay | Admitting: Internal Medicine

## 2011-12-11 VITALS — BP 116/78 | HR 90 | Temp 97.3°F | Resp 16 | Wt 159.4 lb

## 2011-12-11 DIAGNOSIS — I2510 Atherosclerotic heart disease of native coronary artery without angina pectoris: Secondary | ICD-10-CM | POA: Diagnosis not present

## 2011-12-11 DIAGNOSIS — I10 Essential (primary) hypertension: Secondary | ICD-10-CM

## 2011-12-11 DIAGNOSIS — R1032 Left lower quadrant pain: Secondary | ICD-10-CM

## 2011-12-11 DIAGNOSIS — F411 Generalized anxiety disorder: Secondary | ICD-10-CM | POA: Diagnosis not present

## 2011-12-11 DIAGNOSIS — F419 Anxiety disorder, unspecified: Secondary | ICD-10-CM

## 2011-12-11 NOTE — Assessment & Plan Note (Signed)
stable overall by hx and exam,  and pt to continue medical treatment as before, declines further med at this time such as ssri

## 2011-12-11 NOTE — Patient Instructions (Addendum)
Please Continue all other medications as before Please keep your appointments with your specialists as you have planned  - cardiology Please return in 6 months, as you will be due for further blood work at that time

## 2011-12-11 NOTE — Assessment & Plan Note (Signed)
Exam benign, pt reassured, no further eval or tx needed at this time

## 2011-12-11 NOTE — Progress Notes (Signed)
Subjective:    Patient ID: [name] [name], male    DOB: [birth year], 66 y.o.   MRN: [id number]  HPI  Here to f/u with concern about pain to the left groin for 2-3 wks after heavy lifiting, concerned about a hernia, but pain incidentally resolved over the past 2 days and no swelling noted.  Ambulation is ok, no limping or falls.  No back pain or more distal leg pain, numbness or weakness.  Does mention he could not tolerate toprol xl 25 - only took one dose but became too "cold all over" and did not take further.  Pt denies chest pain, increased sob or doe, wheezing, orthopnea, PND, increased LE swelling, palpitations, dizziness or syncope.  Pt denies new neurological symptoms such as new headache, or facial or extremity weakness or numbness   Pt denies polydipsia, polyuria.  Pt denies fever, wt loss, night sweats, loss of appetite, or other constitutional symptoms. Denies worsening depressive symptoms, suicidal ideation, or panic, though has ongoing anxiety, does not feel he needs more tx at this time Past Medical History  Diagnosis Date  . Old myocardial infarction 1989  . HTN (hypertension)   . HLD (hyperlipidemia)   . CAD (coronary artery disease)   . Hearing loss in right ear   . Atherosclerosis of aorta   . Inguinal hernia without mention of obstruction or gangrene, unilateral or unspecified, (not specified as recurrent)   . Chronic prostatitis   . BPH (benign prostatic hyperplasia)   . PUD (peptic ulcer disease)   . Diverticulitis   . Allergic rhinitis   . DJD (degenerative joint disease), lumbosacral   . Transient amnesia   . Ocular migraine   . Anxiety 05/01/2011   Past Surgical History  Procedure Date  . Inguinal hernia repair   . Appendectomy   . Coronary stent placement     reports that he has quit smoking. He does not have any smokeless tobacco history on file. He reports that he drinks alcohol. He reports that he does not use illicit drugs. family history includes Alcohol abuse  in his brother and father; Cancer in his father and unspecified family member; Heart disease in an unspecified family member; and Hypertension in an unspecified family member. Allergies  Allergen Reactions  . Niacin   . Penicillins    Current Outpatient Prescriptions on File Prior to Visit  Medication Sig Dispense Refill  . aspirin 325 MG tablet Take 325 mg by mouth daily.        Marland Kitchen EFFIENT 10 MG TABS TAKE 1 TABLET BY MOUTH DAILY  30 tablet  5  . HYDROcodone-acetaminophen (NORCO) 5-325 MG per tablet As directed      . ibuprofen (ADVIL,MOTRIN) 200 MG tablet Take 200 mg by mouth every 6 (six) hours as needed.        Marland Kitchen lisinopril (PRINIVIL,ZESTRIL) 5 MG tablet Take 5 mg by mouth daily.        . methocarbamol (ROBAXIN) 500 MG tablet As directed      . Methylcellulose, Laxative, (CITRUCEL) 500 MG TABS 1 tab po bid       . nitroGLYCERIN (NITROSTAT) 0.4 MG SL tablet Place 1 tablet (0.4 mg total) under the tongue every 5 (five) minutes as needed for chest pain.  25 tablet  4  . simvastatin (ZOCOR) 40 MG tablet Take 40 mg by mouth at bedtime.        Marland Kitchen DISCONTD: esomeprazole (NEXIUM) 40 MG capsule Take 1 capsule (40 mg total) by mouth  daily before breakfast.  90 capsule  3  . DISCONTD: fluticasone (FLONASE) 50 MCG/ACT nasal spray Place 2 sprays into the nose daily.  48 g  3  . metoprolol succinate (TOPROL-XL) 25 MG 24 hr tablet Take 1 tablet (25 mg total) by mouth daily.  30 tablet  11   Review of Systems Review of Systems  Constitutional: Negative for diaphoresis and unexpected weight change.    Eyes: Negative for photophobia and visual disturbance.  Respiratory: Negative for choking and stridor.   Gastrointestinal: Negative for vomiting and blood in stool.  Genitourinary: Negative for hematuria and decreased urine volume.  Musculoskeletal: Negative for gait problem.  Skin: Negative for color change and wound.  Neurological: Negative for tremors and numbness.  Objective:   Physical Exam BP  116/78  Pulse 90  Temp(Src) 97.3 F (36.3 C) (Oral)  Resp 16  Wt 159 lb 6 oz (72.292 kg)  SpO2 97% Physical Exam  VS noted Constitutional: Pt appears well-developed and well-nourished.  HENT: Head: Normocephalic.  Right Ear: External ear normal.  Left Ear: External ear normal.  Eyes: Conjunctivae and EOM are normal. Pupils are equal, round, and reactive to light.  Neck: Normal range of motion. Neck supple.  Cardiovascular: Normal rate and regular rhythm.   Pulmonary/Chest: Effort normal and breath sounds normal.  Abd:  Soft, NT, non-distended, + BS Neurological: Pt is alert. Not confused Skin: Skin is warm. No erythema.  Left groin:  Nontender, no swelling, no LA, redness, swelling Psychiatric: Pt behavior is normal. Thought content normal. 1+ nervous    Assessment & Plan:

## 2011-12-11 NOTE — Assessment & Plan Note (Signed)
stable overall by hx and exam, most recent data reviewed with pt, and pt to continue medical treatment as before  Lab Results  Component Value Date   WBC 5.5 05/01/2011   HGB 14.4 05/01/2011   HCT 42.3 05/01/2011   PLT 197.0 05/01/2011   GLUCOSE 102* 05/01/2011   CHOL 176 05/01/2011   TRIG 122.0 05/01/2011   HDL 51.70 05/01/2011   LDLDIRECT 116.9 04/27/2010   LDLCALC 100* 05/01/2011   ALT 27 05/01/2011   AST 27 05/01/2011   NA 142 05/01/2011   K 4.5 05/01/2011   CL 104 05/01/2011   CREATININE 1.1 05/01/2011   BUN 17 05/01/2011   CO2 30 05/01/2011   TSH 2.59 05/01/2011   PSA 0.99 05/01/2011   INR 1.0 ratio 06/03/2010   HGBA1C 5.7 03/19/2009    

## 2011-12-11 NOTE — Assessment & Plan Note (Signed)
stable overall by hx and exam, most recent data reviewed with pt, and pt to continue medical treatment as before BP Readings from Last 3 Encounters:  12/11/11 116/78  09/28/11 140/75  09/05/11 112/60   Pt declines further tx with toprol xl 25

## 2011-12-13 DIAGNOSIS — I2510 Atherosclerotic heart disease of native coronary artery without angina pectoris: Secondary | ICD-10-CM | POA: Diagnosis not present

## 2011-12-13 DIAGNOSIS — I252 Old myocardial infarction: Secondary | ICD-10-CM | POA: Diagnosis not present

## 2011-12-13 DIAGNOSIS — E785 Hyperlipidemia, unspecified: Secondary | ICD-10-CM | POA: Diagnosis not present

## 2011-12-20 ENCOUNTER — Ambulatory Visit (INDEPENDENT_AMBULATORY_CARE_PROVIDER_SITE_OTHER)
Admission: RE | Admit: 2011-12-20 | Discharge: 2011-12-20 | Disposition: A | Discharge status: Home / Self Care | Payer: Medicare Other | Source: Ambulatory Visit | Attending: Internal Medicine | Admitting: Internal Medicine

## 2011-12-20 ENCOUNTER — Encounter: Payer: Self-pay | Admitting: Internal Medicine

## 2011-12-20 ENCOUNTER — Telehealth: Payer: Self-pay

## 2011-12-20 ENCOUNTER — Ambulatory Visit (INDEPENDENT_AMBULATORY_CARE_PROVIDER_SITE_OTHER): Payer: Medicare Other | Admitting: Internal Medicine

## 2011-12-20 VITALS — BP 142/72 | HR 97 | Temp 97.5°F | Ht 67.0 in | Wt 159.1 lb

## 2011-12-20 DIAGNOSIS — R229 Localized swelling, mass and lump, unspecified: Secondary | ICD-10-CM

## 2011-12-20 DIAGNOSIS — F411 Generalized anxiety disorder: Secondary | ICD-10-CM

## 2011-12-20 DIAGNOSIS — I10 Essential (primary) hypertension: Secondary | ICD-10-CM | POA: Diagnosis not present

## 2011-12-20 DIAGNOSIS — F419 Anxiety disorder, unspecified: Secondary | ICD-10-CM

## 2011-12-20 DIAGNOSIS — R599 Enlarged lymph nodes, unspecified: Secondary | ICD-10-CM | POA: Diagnosis not present

## 2011-12-20 DIAGNOSIS — R2230 Localized swelling, mass and lump, unspecified upper limb: Secondary | ICD-10-CM

## 2011-12-20 MED ORDER — DOXYCYCLINE HYCLATE 100 MG PO TABS: 100.0000 mg | ORAL_TABLET | Freq: Two times a day (BID) | ORAL | Status: AC

## 2011-12-20 NOTE — Assessment & Plan Note (Signed)
.   BP Readings from Last 3 Encounters:  12/20/11 142/72  12/11/11 116/78  09/28/11 140/75  stable overall by hx and exam, most recent data reviewed with pt, and pt to continue medical treatment as before

## 2011-12-20 NOTE — Patient Instructions (Addendum)
Take all new medications as prescribed Continue all other medications as before Please go to XRAY in the Basement for the x-ray test You will be contacted by phone if any changes need to be made immediately.  Otherwise, you will receive a letter about your results with an explanation.  

## 2011-12-20 NOTE — Assessment & Plan Note (Signed)
stable overall by hx and exam, most recent data reviewed with pt, and pt to continue medical treatment as before  Lab Results  Component Value Date   WBC 5.5 05/01/2011   HGB 14.4 05/01/2011   HCT 42.3 05/01/2011   PLT 197.0 05/01/2011   GLUCOSE 102* 05/01/2011   CHOL 176 05/01/2011   TRIG 122.0 05/01/2011   HDL 51.70 05/01/2011   LDLDIRECT 116.9 04/27/2010   LDLCALC 100* 05/01/2011   ALT 27 05/01/2011   AST 27 05/01/2011   NA 142 05/01/2011   K 4.5 05/01/2011   CL 104 05/01/2011   CREATININE 1.1 05/01/2011   BUN 17 05/01/2011   CO2 30 05/01/2011   TSH 2.59 05/01/2011   PSA 0.99 05/01/2011   INR 1.0 ratio 06/03/2010   HGBA1C 5.7 03/19/2009    

## 2011-12-20 NOTE — Telephone Encounter (Signed)
Message copied by Pincus Sanes on Wed Dec 20, 2011  2:44 PM ------      Message from: Corwin Levins      Created: Wed Dec 20, 2011  1:38 PM      Regarding: RE: WORK IN       [place] with me      ----- Message -----         From: Etheleen Sia         Sent: 12/20/2011   1:26 PM           To: Corwin Levins, MD      Subject: WORK IN                                                  PT WOULD LIKE TO BE WORKED IN FOR SWOLLEN LYMPH NODE IN [place] [facility].            [phone]

## 2011-12-20 NOTE — Telephone Encounter (Signed)
Called the patient informed the wife ok to come in today and transferred to schedule to addon.

## 2011-12-20 NOTE — Progress Notes (Signed)
Subjective:    Patient ID: [name] [name], male    DOB: 1945-12-27, 66 y.o.   MRN: [id number]  HPI Here with c/o new 10 days onset right axillary lump, small but can feel even with walking and normal arm swing;  No pain, fever, drainage and  Pt denies fever, wt loss, night sweats, loss of appetite, or other constitutional symptoms.  No prior hx of same, and lump doesn't seem to change since onset.  Nothing seems to make better or worse.  Pt denies chest pain, increased sob or doe, wheezing, orthopnea, PND, increased LE swelling, palpitations, dizziness or syncope.   Pt denies polydipsia, polyuria.  Overall good compliance with treatment, and good medicine tolerability. Past Medical History  Diagnosis Date  . Old myocardial infarction 1989  . HTN (hypertension)   . HLD (hyperlipidemia)   . CAD (coronary artery disease)   . Hearing loss in right ear   . Atherosclerosis of aorta   . Inguinal hernia without mention of obstruction or gangrene, unilateral or unspecified, (not specified as recurrent)   . Chronic prostatitis   . BPH (benign prostatic hyperplasia)   . PUD (peptic ulcer disease)   . Diverticulitis   . Allergic rhinitis   . DJD (degenerative joint disease), lumbosacral   . Transient amnesia   . Ocular migraine   . Anxiety 05/01/2011   Past Surgical History  Procedure Date  . Inguinal hernia repair   . Appendectomy   . Coronary stent placement     reports that he has quit smoking. He does not have any smokeless tobacco history on file. He reports that he drinks alcohol. He reports that he does not use illicit drugs. family history includes Alcohol abuse in his brother and father; Cancer in his father and unspecified family member; Heart disease in an unspecified family member; and Hypertension in an unspecified family member. Allergies  Allergen Reactions  . Niacin   . Penicillins    Current Outpatient Prescriptions on File Prior to Visit  Medication Sig Dispense Refill  .  aspirin 325 MG tablet Take 325 mg by mouth daily.        Marland Kitchen atorvastatin (LIPITOR) 20 MG tablet Take 20 mg by mouth daily.      Marland Kitchen esomeprazole (NEXIUM) 40 MG capsule Take 40 mg by mouth daily before supper.      . fluticasone (FLONASE) 50 MCG/ACT nasal spray Place 2 sprays into the nose daily as needed.      Marland Kitchen HYDROcodone-acetaminophen (NORCO) 5-325 MG per tablet As directed      . ibuprofen (ADVIL,MOTRIN) 200 MG tablet Take 200 mg by mouth every 6 (six) hours as needed.        . methocarbamol (ROBAXIN) 500 MG tablet As directed      . Methylcellulose, Laxative, (CITRUCEL) 500 MG TABS 1 tab po bid       . nitroGLYCERIN (NITROSTAT) 0.4 MG SL tablet Place 1 tablet (0.4 mg total) under the tongue every 5 (five) minutes as needed for chest pain.  25 tablet  4   Review of Systems Review of Systems  Constitutional: Negative for diaphoresis and unexpected weight change.  Eyes: Negative for photophobia and visual disturbance.  Respiratory: Negative for choking and stridor.   Gastrointestinal: Negative for vomiting and blood in stool.  Genitourinary: Negative for hematuria and decreased urine volume.  Musculoskeletal: Negative for gait problem.  Skin: Negative for color change and wound.  Lymph:  No other axillary, neck or groin LA Neurological:  Negative for tremors and numbness.  Psychiatric/Behavioral: Negative for decreased concentration. The patient is not hyperactive.       Objective:   Physical Exam BP 142/72  Pulse 97  Temp 97.5 F (36.4 C) (Oral)  Ht 5\' 7"  (1.702 m)  Wt 159 lb 2 oz (72.179 kg)  BMI 24.92 kg/m2  SpO2 97% Physical Exam  VS noted Constitutional: Pt appears well-developed and well-nourished.  HENT: Head: Normocephalic.  Right Ear: External ear normal.  Left Ear: External ear normal.  Eyes: Conjunctivae and EOM are normal. Pupils are equal, round, and reactive to light.  Neck: Normal range of motion. Neck supple.  Cardiovascular: Normal rate and regular rhythm.     Pulmonary/Chest: Effort normal and breath sounds normal.  Abd:  Soft, NT, non-distended, + BS Neurological: Pt is alert. No cranial nerve deficit.  Skin: Skin: Negative for color change and wound.  Right axilla with subq approx 1/2 cm nodular lesion, NT, nonfluctuant, no drainage, no other LA  Lymph:  No other axillary, neck or groin LA; no breasts mass Psychiatric: Pt behavior is normal. Thought content normal. 1-2+ nervous     Assessment & Plan:

## 2011-12-20 NOTE — Assessment & Plan Note (Signed)
?   Cyst vs LA vs other;  No pain or fever, for cxr, and empiric doxy course for now, but if does not resolve or worsens would consider gen surg referral

## 2011-12-29 ENCOUNTER — Encounter: Payer: Self-pay | Admitting: Internal Medicine

## 2011-12-29 ENCOUNTER — Ambulatory Visit (INDEPENDENT_AMBULATORY_CARE_PROVIDER_SITE_OTHER): Payer: Medicare Other | Admitting: Internal Medicine

## 2011-12-29 VITALS — BP 112/74 | HR 90 | Temp 97.0°F | Ht 67.0 in | Wt 159.4 lb

## 2011-12-29 DIAGNOSIS — I10 Essential (primary) hypertension: Secondary | ICD-10-CM | POA: Diagnosis not present

## 2011-12-29 DIAGNOSIS — F411 Generalized anxiety disorder: Secondary | ICD-10-CM

## 2011-12-29 DIAGNOSIS — F419 Anxiety disorder, unspecified: Secondary | ICD-10-CM

## 2011-12-29 DIAGNOSIS — R2230 Localized swelling, mass and lump, unspecified upper limb: Secondary | ICD-10-CM

## 2011-12-29 DIAGNOSIS — R229 Localized swelling, mass and lump, unspecified: Secondary | ICD-10-CM | POA: Diagnosis not present

## 2011-12-29 NOTE — Patient Instructions (Addendum)
Continue all other medications as before OK to hold off on more antibioitics at this time You will be contacted regarding the referral for: ultrasound

## 2011-12-30 ENCOUNTER — Encounter: Payer: Self-pay | Admitting: Internal Medicine

## 2011-12-30 NOTE — Assessment & Plan Note (Signed)
Today still stable overall by hx and exam, most recent data reviewed with pt, and pt to continue medical treatment as before BP Readings from Last 3 Encounters:  12/29/11 112/74  12/20/11 142/72  12/11/11 116/78

## 2011-12-30 NOTE — Assessment & Plan Note (Signed)
Ongoing issue, mild situationally worse, declines need for further tx at this time

## 2011-12-30 NOTE — Progress Notes (Signed)
Subjective:    Patient ID: [name] [name], male    DOB: [birth year], 66 y.o.   MRN: [id number]  HPI  Here after reading more on google for f/u exam; very nervous but admits the mass to the right axilla is less "firm", smaller but still with the ability to feel a mass with arm swing and right axilla seems to smell more than left;  Now recalls also the mass came up with antiperspirant use for a few days, when he normally uses deoderant.  Tolerating tx well,  Pt denies fever, wt loss, night sweats, loss of appetite, or other constitutional symptoms  Pt denies chest pain, increased sob or doe, wheezing, orthopnea, PND, increased LE swelling, palpitations, dizziness or syncope.   Pt denies polydipsia, polyuria. Wife very concerned, as pt is very concerned, and with him today, both asking for an imaging study. Past Medical History  Diagnosis Date  . Old myocardial infarction 1989  . HTN (hypertension)   . HLD (hyperlipidemia)   . CAD (coronary artery disease)   . Hearing loss in right ear   . Atherosclerosis of aorta   . Inguinal hernia without mention of obstruction or gangrene, unilateral or unspecified, (not specified as recurrent)   . Chronic prostatitis   . BPH (benign prostatic hyperplasia)   . PUD (peptic ulcer disease)   . Diverticulitis   . Allergic rhinitis   . DJD (degenerative joint disease), lumbosacral   . Transient amnesia   . Ocular migraine   . Anxiety 05/01/2011   Past Surgical History  Procedure Date  . Inguinal hernia repair   . Appendectomy   . Coronary stent placement     reports that he has quit smoking. He does not have any smokeless tobacco history on file. He reports that he drinks alcohol. He reports that he does not use illicit drugs. family history includes Alcohol abuse in his brother and father; Cancer in his father and unspecified family member; Heart disease in an unspecified family member; and Hypertension in an unspecified family member. Allergies  Allergen  Reactions  . Niacin   . Penicillins    Current Outpatient Prescriptions on File Prior to Visit  Medication Sig Dispense Refill  . aspirin 325 MG tablet Take 325 mg by mouth daily.        Marland Kitchen atorvastatin (LIPITOR) 20 MG tablet Take 20 mg by mouth daily.      Marland Kitchen doxycycline (VIBRA-TABS) 100 MG tablet Take 1 tablet (100 mg total) by mouth 2 (two) times daily.  20 tablet  0  . esomeprazole (NEXIUM) 40 MG capsule Take 40 mg by mouth daily before supper.      . fluticasone (FLONASE) 50 MCG/ACT nasal spray Place 2 sprays into the nose daily as needed.      Marland Kitchen HYDROcodone-acetaminophen (NORCO) 5-325 MG per tablet As directed      . ibuprofen (ADVIL,MOTRIN) 200 MG tablet Take 200 mg by mouth every 6 (six) hours as needed.        . methocarbamol (ROBAXIN) 500 MG tablet As directed      . Methylcellulose, Laxative, (CITRUCEL) 500 MG TABS 1 tab po bid       . nitroGLYCERIN (NITROSTAT) 0.4 MG SL tablet Place 1 tablet (0.4 mg total) under the tongue every 5 (five) minutes as needed for chest pain.  25 tablet  4   Review of Systems Constitutional: Negative for diaphoresis and unexpected weight change.  HENT: Negative for drooling and tinnitus.   Eyes: Negative  for photophobia and visual disturbance.  Respiratory: Negative for choking and stridor.   Gastrointestinal: Negative for vomiting and blood in stool.  Genitourinary: Negative for hematuria and decreased urine volume.  Musculoskeletal: Negative for gait problem.   Psychiatric/Behavioral: Negative for decreased concentration. The patient is not hyperactive.      Objective:   Physical Exam BP 112/74  Pulse 90  Temp 97 F (36.1 C) (Oral)  Ht 5\' 7"  (1.702 m)  Wt 159 lb 6 oz (72.292 kg)  BMI 24.96 kg/m2  SpO2 97% Physical Exam  VS noted, not ill appearing Constitutional: Pt appears well-developed and well-nourished.  HENT: Head: Normocephalic.  Right Ear: External ear normal.  Left Ear: External ear normal.  Eyes: Conjunctivae and EOM are  normal. Pupils are equal, round, and reactive to light.  Neck: Normal range of motion. Neck supple.  Cardiovascular: Normal rate and regular rhythm.   Pulmonary/Chest: Effort normal and breath sounds normal.  Left axilla:  No mass or tenderness;  Right axilla:  Previous mass less firm, prob 1/2 size of prior exam, minor tender, no drainage or fluctuance Neurological: Pt is alert. No cranial nerve deficit.  Skin: Skin is warm. No erythema.  Psychiatric: Pt behavior is normal. Thought content normal.     Assessment & Plan:

## 2011-12-30 NOTE — Assessment & Plan Note (Signed)
clinicially improved, reviewed cXR with pt - no acute,  Pt still with marked apprehension - ok for soft tissue u/s imaging

## 2012-01-02 ENCOUNTER — Other Ambulatory Visit: Payer: Self-pay | Admitting: Internal Medicine

## 2012-01-02 ENCOUNTER — Telehealth: Payer: Self-pay | Admitting: Internal Medicine

## 2012-01-02 DIAGNOSIS — R2230 Localized swelling, mass and lump, unspecified upper limb: Secondary | ICD-10-CM

## 2012-01-02 NOTE — Telephone Encounter (Signed)
Mary to inform [facility] - unable to order bilat diag mammogram for dx of axillary lump in Epic, and the "Imaging 2644" does not work as far as I can tell either

## 2012-01-03 ENCOUNTER — Other Ambulatory Visit: Payer: Self-pay | Admitting: Internal Medicine

## 2012-01-03 DIAGNOSIS — N63 Unspecified lump in unspecified breast: Secondary | ICD-10-CM

## 2012-01-11 ENCOUNTER — Encounter: Payer: Self-pay | Admitting: Internal Medicine

## 2012-01-11 ENCOUNTER — Ambulatory Visit
Admission: RE | Admit: 2012-01-11 | Discharge: 2012-01-11 | Disposition: A | Discharge status: Home / Self Care | Payer: Medicare Other | Source: Ambulatory Visit | Attending: Internal Medicine | Admitting: Internal Medicine

## 2012-01-11 DIAGNOSIS — N6459 Other signs and symptoms in breast: Secondary | ICD-10-CM | POA: Diagnosis not present

## 2012-01-11 DIAGNOSIS — R2230 Localized swelling, mass and lump, unspecified upper limb: Secondary | ICD-10-CM

## 2012-01-11 DIAGNOSIS — N63 Unspecified lump in unspecified breast: Secondary | ICD-10-CM

## 2012-01-30 DIAGNOSIS — I252 Old myocardial infarction: Secondary | ICD-10-CM | POA: Diagnosis not present

## 2012-02-29 DIAGNOSIS — M545 Low back pain: Secondary | ICD-10-CM | POA: Diagnosis not present

## 2012-03-11 DIAGNOSIS — Z23 Encounter for immunization: Secondary | ICD-10-CM | POA: Diagnosis not present

## 2012-03-12 ENCOUNTER — Telehealth: Payer: Self-pay

## 2012-03-12 NOTE — Telephone Encounter (Signed)
Fax from [employer]. [address] [place] Flu Vaccine information Date Administered 03/11/12 Lot [id number] Mfg. Ernestene Mention Left Deltoid Im Dose 0.48ml Documented under immunizations in chart.

## 2012-04-04 DIAGNOSIS — S335XXA Sprain of ligaments of lumbar spine, initial encounter: Secondary | ICD-10-CM | POA: Diagnosis not present

## 2012-04-04 DIAGNOSIS — M545 Low back pain: Secondary | ICD-10-CM | POA: Diagnosis not present

## 2012-04-11 DIAGNOSIS — M545 Low back pain: Secondary | ICD-10-CM | POA: Diagnosis not present

## 2012-04-19 DIAGNOSIS — M545 Low back pain: Secondary | ICD-10-CM | POA: Diagnosis not present

## 2012-05-22 ENCOUNTER — Ambulatory Visit (INDEPENDENT_AMBULATORY_CARE_PROVIDER_SITE_OTHER): Payer: Medicare Other | Admitting: Internal Medicine

## 2012-05-22 ENCOUNTER — Other Ambulatory Visit (INDEPENDENT_AMBULATORY_CARE_PROVIDER_SITE_OTHER): Payer: Medicare Other

## 2012-05-22 ENCOUNTER — Encounter: Payer: Self-pay | Admitting: Internal Medicine

## 2012-05-22 VITALS — BP 120/74 | HR 93 | Temp 97.9°F | Ht 67.5 in | Wt 153.5 lb

## 2012-05-22 DIAGNOSIS — I10 Essential (primary) hypertension: Secondary | ICD-10-CM | POA: Diagnosis not present

## 2012-05-22 DIAGNOSIS — E785 Hyperlipidemia, unspecified: Secondary | ICD-10-CM | POA: Diagnosis not present

## 2012-05-22 DIAGNOSIS — F411 Generalized anxiety disorder: Secondary | ICD-10-CM | POA: Diagnosis not present

## 2012-05-22 DIAGNOSIS — N320 Bladder-neck obstruction: Secondary | ICD-10-CM

## 2012-05-22 DIAGNOSIS — F419 Anxiety disorder, unspecified: Secondary | ICD-10-CM

## 2012-05-22 LAB — TSH: TSH: 1.36 u[IU]/mL (ref 0.35–5.50)

## 2012-05-22 LAB — BASIC METABOLIC PANEL
BUN: 14 mg/dL (ref 6–23)
Calcium: 9.4 mg/dL (ref 8.4–10.5)
GFR: 71.46 mL/min (ref >60.00)
Glucose, Bld: 103 mg/dL — ABNORMAL HIGH (ref 70–99)

## 2012-05-22 LAB — CBC WITH DIFFERENTIAL/PLATELET
Basophils Relative: 0.2 % (ref 0.0–3.0)
Eosinophils Relative: 0.4 % (ref 0.0–5.0)
HCT: 44.4 % (ref 39.0–52.0)
Hemoglobin: 14.9 g/dL (ref 13.0–17.0)
Lymphs Abs: 1.3 10*3/uL (ref 0.7–4.0)
MCV: 94.1 fl (ref 78.0–100.0)
Monocytes Relative: 11.7 % (ref 3.0–12.0)
Neutro Abs: 3.6 10*3/uL (ref 1.4–7.7)
Platelets: 177 10*3/uL (ref 150.0–400.0)
RBC: 4.72 Mil/uL (ref 4.22–5.81)
WBC: 5.6 10*3/uL (ref 4.5–10.5)

## 2012-05-22 LAB — URINALYSIS, ROUTINE W REFLEX MICROSCOPIC
Ketones, ur: NEGATIVE
Leukocytes, UA: NEGATIVE
Specific Gravity, Urine: 1.01 (ref 1.000–1.030)
Total Protein, Urine: NEGATIVE
pH: 6.5 (ref 5.0–8.0)

## 2012-05-22 LAB — LIPID PANEL
Cholesterol: 140 mg/dL (ref 0–200)
HDL: 37.8 mg/dL — ABNORMAL LOW (ref >39.00)
LDL Cholesterol: 78 mg/dL (ref 0–99)
VLDL: 24.6 mg/dL (ref 0.0–40.0)

## 2012-05-22 LAB — PSA: PSA: 1.01 ng/mL (ref 0.10–4.00)

## 2012-05-22 LAB — HEPATIC FUNCTION PANEL
Albumin: 3.9 g/dL (ref 3.5–5.2)
Total Bilirubin: 1 mg/dL (ref 0.3–1.2)

## 2012-05-22 MED ORDER — ESOMEPRAZOLE MAGNESIUM 40 MG PO CPDR: 40.0000 mg | DELAYED_RELEASE_CAPSULE | Freq: Every day | ORAL | Status: DC

## 2012-05-22 NOTE — Progress Notes (Signed)
Subjective:    Patient ID: [name] [name], male    DOB: [birth year], 66 y.o.   MRN: [id number]  HPI  Here for f/u;  Overall doing ok;  Pt denies CP, worsening SOB, DOE, wheezing, orthopnea, PND, worsening LE edema, palpitations, dizziness or syncope.  Pt denies neurological change such as new Headache, facial or extremity weakness.  Pt denies polydipsia, polyuria, or low sugar symptoms. Pt states overall good compliance with treatment and medications, good tolerability, and trying to follow lower cholesterol diet.  Pt denies worsening depressive symptoms, suicidal ideation or panic. No fever, wt loss, night sweats, loss of appetite, or other constitutional symptoms.  Pt states good ability with ADL's, low fall risk, home safety reviewed and adequate, no significant changes in hearing or vision, and occasionally active with exercise.  No acute complaints.  Needs nexium refill.  Wt down from 159 to 153 with better diet Past Medical History  Diagnosis Date  . Old myocardial infarction 1989  . HTN (hypertension)   . HLD (hyperlipidemia)   . CAD (coronary artery disease)   . Hearing loss in right ear   . Atherosclerosis of aorta   . Inguinal hernia without mention of obstruction or gangrene, unilateral or unspecified, (not specified as recurrent)   . Chronic prostatitis   . BPH (benign prostatic hyperplasia)   . PUD (peptic ulcer disease)   . Diverticulitis   . Allergic rhinitis   . DJD (degenerative joint disease), lumbosacral   . Transient amnesia   . Ocular migraine   . Anxiety 05/01/2011   Past Surgical History  Procedure Date  . Inguinal hernia repair   . Appendectomy   . Coronary stent placement     reports that he has quit smoking. He does not have any smokeless tobacco history on file. He reports that he drinks alcohol. He reports that he does not use illicit drugs. family history includes Alcohol abuse in his brother and father; Cancer in his father and unspecified family member; Heart  disease in an unspecified family member; and Hypertension in an unspecified family member. Allergies  Allergen Reactions  . Niacin   . Penicillins    Current Outpatient Prescriptions on File Prior to Visit  Medication Sig Dispense Refill  . aspirin 325 MG tablet Take 325 mg by mouth daily.        Marland Kitchen atorvastatin (LIPITOR) 20 MG tablet Take 20 mg by mouth daily.      . fluticasone (FLONASE) 50 MCG/ACT nasal spray Place 2 sprays into the nose daily as needed.      . gabapentin (NEURONTIN) 100 MG capsule Take 100 mg by mouth 3 (three) times daily.      Marland Kitchen ibuprofen (ADVIL,MOTRIN) 200 MG tablet Take 200 mg by mouth every 6 (six) hours as needed.        . methocarbamol (ROBAXIN) 500 MG tablet As directed      . Methylcellulose, Laxative, (CITRUCEL) 500 MG TABS 1 tab po bid       . nitroGLYCERIN (NITROSTAT) 0.4 MG SL tablet Place 1 tablet (0.4 mg total) under the tongue every 5 (five) minutes as needed for chest pain.  25 tablet  4  . [DISCONTINUED] esomeprazole (NEXIUM) 40 MG capsule Take 40 mg by mouth daily before supper.      Marland Kitchen HYDROcodone-acetaminophen (NORCO) 5-325 MG per tablet As directed       Review of Systems  Constitutional: Negative for diaphoresis and unexpected weight change.  HENT: Negative for tinnitus.  Eyes: Negative for photophobia and visual disturbance.  Respiratory: Negative for choking and stridor.   Gastrointestinal: Negative for vomiting and blood in stool.  Genitourinary: Negative for hematuria and decreased urine volume.  Musculoskeletal: Negative for gait problem.  Skin: Negative for color change and wound.  Neurological: Negative for tremors and numbness.  Psychiatric/Behavioral: Negative for decreased concentration. The patient is not hyperactive.       Objective:   Physical Exam BP 120/74  Pulse 93  Temp 97.9 F (36.6 C) (Oral)  Ht 5' 7.5" (1.715 m)  Wt 153 lb 8 oz (69.627 kg)  BMI 23.69 kg/m2  SpO2 97% Physical Exam  VS noted Constitutional: Pt is  oriented to person, place, and time. Appears well-developed and well-nourished.  HENT:  Head: Normocephalic and atraumatic.  Right Ear: External ear normal.  Left Ear: External ear normal.  Nose: Nose normal.  Mouth/Throat: Oropharynx is clear and moist.  Eyes: Conjunctivae and EOM are normal. Pupils are equal, round, and reactive to light.  Neck: Normal range of motion. Neck supple. No JVD present. No tracheal deviation present.  Cardiovascular: Normal rate, regular rhythm, normal heart sounds and intact distal pulses.   Pulmonary/Chest: Effort normal and breath sounds normal.  Abdominal: Soft. Bowel sounds are normal. There is no tenderness.  Musculoskeletal: Normal range of motion. Exhibits no edema.  Lymphadenopathy:  Has no cervical adenopathy.  Neurological: Pt is alert and oriented to person, place, and time. Pt has normal reflexes. No cranial nerve deficit.  Skin: Skin is warm and dry. No rash noted.  Psychiatric:  Has  normal mood and affect. Behavior is normal.     Assessment & Plan:

## 2012-05-22 NOTE — Patient Instructions (Addendum)
Continue all other medications as before Your medication was refilled as requested Please have the pharmacy call with any other refills you may need. Please go to LAB in the Basement for the blood and/or urine tests to be done today You will be contacted by phone if any changes need to be made immediately.  Otherwise, you will receive a letter about your results with an explanation, but please check with MyChart first. Thank you for enrolling in MyChart. Please follow the instructions below to securely access your online medical record. MyChart allows you to send messages to your doctor, view your test results, renew your prescriptions, schedule appointments, and more. To Log into MyChart, please go to [email or web address], and your Username is: [email or web address] Please keep your appointments with your specialists as you have planned You are otherwise up to date with prevention measures Please continue your efforts at being more active, low cholesterol diet, and weight control Please return in 1 year for your yearly visit, or sooner if needed

## 2012-05-22 NOTE — Assessment & Plan Note (Signed)
stable overall by hx and exam, most recent data reviewed with pt, and pt to continue medical treatment as before BP Readings from Last 3 Encounters:  05/22/12 120/74  12/29/11 112/74  12/20/11 142/72

## 2012-05-22 NOTE — Assessment & Plan Note (Signed)
Also due for psa 

## 2012-05-22 NOTE — Assessment & Plan Note (Signed)
stable overall by hx and exam, most recent data reviewed with pt, and pt to continue medical treatment as before Lab Results  Component Value Date   WBC 5.6 05/22/2012   HGB 14.9 05/22/2012   HCT 44.4 05/22/2012   PLT 177.0 05/22/2012   GLUCOSE 103* 05/22/2012   CHOL 140 05/22/2012   TRIG 123.0 05/22/2012   HDL 37.80* 05/22/2012   LDLDIRECT 116.9 04/27/2010   LDLCALC 78 05/22/2012   ALT 25 05/22/2012   AST 27 05/22/2012   NA 139 05/22/2012   K 4.1 05/22/2012   CL 101 05/22/2012   CREATININE 1.1 05/22/2012   BUN 14 05/22/2012   CO2 32 05/22/2012   TSH 1.36 05/22/2012   PSA 1.01 05/22/2012   INR 1.0 ratio 06/03/2010   HGBA1C 5.7 03/19/2009

## 2012-05-22 NOTE — Assessment & Plan Note (Signed)
stable overall by hx and exam, most recent data reviewed with pt, and pt to continue medical treatment as before Lab Results  Component Value Date   LDLCALC 78 05/22/2012

## 2012-06-11 ENCOUNTER — Ambulatory Visit (INDEPENDENT_AMBULATORY_CARE_PROVIDER_SITE_OTHER): Payer: Medicare Other | Admitting: Internal Medicine

## 2012-06-11 ENCOUNTER — Encounter: Payer: Self-pay | Admitting: Internal Medicine

## 2012-06-11 VITALS — BP 124/80 | HR 88 | Temp 98.5°F | Resp 10 | Wt 154.1 lb

## 2012-06-11 DIAGNOSIS — I10 Essential (primary) hypertension: Secondary | ICD-10-CM | POA: Diagnosis not present

## 2012-06-11 DIAGNOSIS — R109 Unspecified abdominal pain: Secondary | ICD-10-CM | POA: Diagnosis not present

## 2012-06-11 MED ORDER — HYOSCYAMINE SULFATE 0.125 MG SL SUBL: 0.1250 mg | SUBLINGUAL_TABLET | SUBLINGUAL | Status: DC | PRN

## 2012-06-11 NOTE — Progress Notes (Deleted)
  Subjective:    Patient ID: [name] [name], male    DOB: 1946-03-17, 66 y.o.   MRN: [id number]  HPI    Review of Systems     Objective:   Physical Exam        Assessment & Plan:

## 2012-06-11 NOTE — Patient Instructions (Addendum)
Please increase the nexium to twice per day for now Take all new medications as prescribed - the hyoscyamine as needed for abd pain/spasms You will be contacted regarding the referral for: abdomen ultrasound Please keep your appointments with your specialists as you have planned  - Gastroenterology for Jan 2014

## 2012-06-11 NOTE — Assessment & Plan Note (Signed)
stable overall by hx and exam, most recent data reviewed with pt, and pt to continue medical treatment as before BP Readings from Last 3 Encounters:  06/11/12 124/80  05/22/12 120/74  12/29/11 112/74

## 2012-06-11 NOTE — Progress Notes (Addendum)
Subjective:    Patient ID: [name] [name], male    DOB: 1945-08-03, 66 y.o.   MRN: [id number]  HPI  Here to f/u;  Mentioned at last visit, but here for further consideration;  Has hx of h pylori tx about 15 yrs ago and suspect ? Gastritis;  Onset x 2 mo about the time wife underwent beginning of tx for breast cancer/double mastectomy; c/o persistently intermittent left side/left upper/epigastric abd pain, burning type but with ? Spasms/gurgling/gas like, not assoc with fever, n/v, distension, bowel change, wt loss or change, no radiation, appetitie ok, ? Better with eating and better with sleeping at night, worse to sit somewhat and can also wake him up. Already has GI appt soon. Last egd yrs ago. Pt denies chest pain, increased sob or doe, wheezing, orthopnea, PND, increased LE swelling, palpitations, dizziness or syncope.  Pt denies new neurological symptoms such as new headache, or facial or extremity weakness or numbness Past Medical History  Diagnosis Date  . Old myocardial infarction 1989  . HTN (hypertension)   . HLD (hyperlipidemia)   . CAD (coronary artery disease)   . Hearing loss in right ear   . Atherosclerosis of aorta   . Inguinal hernia without mention of obstruction or gangrene, unilateral or unspecified, (not specified as recurrent)   . Chronic prostatitis   . BPH (benign prostatic hyperplasia)   . PUD (peptic ulcer disease)   . Diverticulitis   . Allergic rhinitis   . DJD (degenerative joint disease), lumbosacral   . Transient amnesia   . Ocular migraine   . Anxiety 05/01/2011   Past Surgical History  Procedure Date  . Inguinal hernia repair   . Appendectomy   . Coronary stent placement     reports that he has quit smoking. He does not have any smokeless tobacco history on file. He reports that he drinks alcohol. He reports that he does not use illicit drugs. family history includes Alcohol abuse in his brother and father; Cancer in his father and unspecified family  member; Heart disease in an unspecified family member; and Hypertension in an unspecified family member. Allergies  Allergen Reactions  . Niacin   . Penicillins    Current Outpatient Prescriptions on File Prior to Visit  Medication Sig Dispense Refill  . aspirin 325 MG tablet Take 325 mg by mouth daily.        Marland Kitchen atorvastatin (LIPITOR) 20 MG tablet Take 20 mg by mouth daily.      Marland Kitchen esomeprazole (NEXIUM) 40 MG capsule Take 1 capsule (40 mg total) by mouth daily before supper.  90 capsule  3  . fluticasone (FLONASE) 50 MCG/ACT nasal spray Place 2 sprays into the nose daily as needed.      Marland Kitchen ibuprofen (ADVIL,MOTRIN) 200 MG tablet Take 200 mg by mouth every 6 (six) hours as needed.        . gabapentin (NEURONTIN) 100 MG capsule Take 100 mg by mouth 3 (three) times daily.      Marland Kitchen HYDROcodone-acetaminophen (NORCO) 5-325 MG per tablet As directed      . hyoscyamine (LEVSIN SL) 0.125 MG SL tablet Place 1 tablet (0.125 mg total) under the tongue every 4 (four) hours as needed for cramping.  30 tablet  2  . methocarbamol (ROBAXIN) 500 MG tablet As directed      . Methylcellulose, Laxative, (CITRUCEL) 500 MG TABS 1 tab po bid       . nitroGLYCERIN (NITROSTAT) 0.4 MG SL tablet Place 1  tablet (0.4 mg total) under the tongue every 5 (five) minutes as needed for chest pain.  25 tablet  4   Review of Systems  Constitutional: Negative for diaphoresis and unexpected weight change.  HENT: Negative for tinnitus.   Eyes: Negative for photophobia and visual disturbance.  Respiratory: Negative for choking and stridor.   Gastrointestinal: Negative for vomiting and blood in stool.  Genitourinary: Negative for hematuria and decreased urine volume.  Musculoskeletal: Negative for gait problem.  Skin: Negative for color change and wound.  Neurological: Negative for tremors and numbness.  Psychiatric/Behavioral: Negative for decreased concentration. The patient is not hyperactive.       Objective:   Physical  Exam BP 124/80  Pulse 88  Temp 98.5 F (36.9 C) (Oral)  Resp 10  Wt 154 lb 1.3 oz (69.89 kg)  SpO2 98% Physical Exam  VS noted Constitutional: Pt appears well-developed and well-nourished.  HENT: Head: Normocephalic.  Right Ear: External ear normal.  Left Ear: External ear normal.  Eyes: Conjunctivae and EOM are normal. Pupils are equal, round, and reactive to light.  Neck: Normal range of motion. Neck supple.  Cardiovascular: Normal rate and regular rhythm.   Pulmonary/Chest: Effort normal and breath sounds normal.  Abd:  Soft,, non-distended, + BS and NT except for mild LLQ tender without guarding or rebound Neurological: Pt is alert. Not confused  Skin: Skin is warm. No erythema.  Psychiatric: Pt behavior is normal. Thought content normal.     Assessment & Plan:

## 2012-06-11 NOTE — Addendum Note (Signed)
Addended by: Corwin Levins on: 06/11/2012 09:29 PM   Modules accepted: Orders

## 2012-06-11 NOTE — Assessment & Plan Note (Signed)
Mild to mod, recent labs reviewed with pt,  to f/u any worsening symptoms or concerns Lab Results  Component Value Date   WBC 5.6 05/22/2012   HGB 14.9 05/22/2012   HCT 44.4 05/22/2012   PLT 177.0 05/22/2012   GLUCOSE 103* 05/22/2012   CHOL 140 05/22/2012   TRIG 123.0 05/22/2012   HDL 37.80* 05/22/2012   LDLDIRECT 116.9 04/27/2010   LDLCALC 78 05/22/2012   ALT 25 05/22/2012   AST 27 05/22/2012   NA 139 05/22/2012   K 4.1 05/22/2012   CL 101 05/22/2012   CREATININE 1.1 05/22/2012   BUN 14 05/22/2012   CO2 32 05/22/2012   TSH 1.36 05/22/2012   PSA 1.01 05/22/2012   INR 1.0 ratio 06/03/2010   HGBA1C 5.7 03/19/2009   To increased nexium bid, hyoscamine prn, and abd u/s, GI referreral - ? Need egd

## 2012-06-13 ENCOUNTER — Ambulatory Visit
Admission: RE | Admit: 2012-06-13 | Discharge: 2012-06-13 | Disposition: A | Discharge status: Home / Self Care | Payer: Medicare Other | Source: Ambulatory Visit | Attending: Internal Medicine | Admitting: Internal Medicine

## 2012-06-13 DIAGNOSIS — N281 Cyst of kidney, acquired: Secondary | ICD-10-CM | POA: Diagnosis not present

## 2012-06-13 DIAGNOSIS — R109 Unspecified abdominal pain: Secondary | ICD-10-CM

## 2012-06-19 ENCOUNTER — Telehealth: Payer: Self-pay | Admitting: Gastroenterology

## 2012-06-19 DIAGNOSIS — I252 Old myocardial infarction: Secondary | ICD-10-CM | POA: Diagnosis not present

## 2012-06-19 DIAGNOSIS — I2510 Atherosclerotic heart disease of native coronary artery without angina pectoris: Secondary | ICD-10-CM | POA: Diagnosis not present

## 2012-06-19 DIAGNOSIS — E785 Hyperlipidemia, unspecified: Secondary | ICD-10-CM | POA: Diagnosis not present

## 2012-06-19 NOTE — Telephone Encounter (Signed)
Patient reports 2 month history of left lower quad abdominal pain.  He reports on the left just below the umbilicus.  He does report that he has tried nexium with no improvement.  Patient states pain is worsening over the last week or so.  He is scheduled to see Mike Gip PA tomorrow at 10:00

## 2012-06-20 ENCOUNTER — Ambulatory Visit (INDEPENDENT_AMBULATORY_CARE_PROVIDER_SITE_OTHER): Payer: Medicare Other | Admitting: Physician Assistant

## 2012-06-20 ENCOUNTER — Encounter: Payer: Self-pay | Admitting: Physician Assistant

## 2012-06-20 ENCOUNTER — Ambulatory Visit (INDEPENDENT_AMBULATORY_CARE_PROVIDER_SITE_OTHER)
Admission: RE | Admit: 2012-06-20 | Discharge: 2012-06-20 | Disposition: A | Discharge status: Home / Self Care | Payer: Medicare Other | Source: Ambulatory Visit | Attending: Physician Assistant | Admitting: Physician Assistant

## 2012-06-20 VITALS — BP 118/62 | HR 98 | Ht 67.5 in | Wt 154.4 lb

## 2012-06-20 DIAGNOSIS — R109 Unspecified abdominal pain: Secondary | ICD-10-CM | POA: Diagnosis not present

## 2012-06-20 MED ORDER — IOHEXOL 300 MG/ML  SOLN
100.0000 mL | Freq: Once | INTRAMUSCULAR | Status: AC | PRN
  Administered 2012-06-20: 100 mL via INTRAVENOUS

## 2012-06-20 NOTE — Progress Notes (Signed)
Reviewed and agree with management plan.  Phinley Schall T. Franchon Ketterman, MD FACG 

## 2012-06-20 NOTE — Progress Notes (Signed)
Subjective:    Patient ID: [name] [name], male    DOB: 1946/01/09, 66 y.o.   MRN: [id number]  HPI [name] [name] is a pleasant 66 year old white male known to Dr.Stark from prior colonoscopy done in 2008. This was positive only for left colon diverticulosis. Patient does have prior history of gastritis and had an endoscopy in 2006 done elsewhere. He also has history of IBS is status post appendectomy, inguinal hernia or a pair and has history of coronary artery disease, hypertension, hyperlipidemia and BPH. Comes in today with complaints of 2 month history of left mid abdominal pain which has been very gradually progressive. He describes it as a burning or sticking type pain which is nonradiating and not severe. He has had no associated nausea vomiting changes in appetite or changes in his bowel habits. He has not noted any melena or hematochezia. His weight has been stable. He is not aware of any injury but says that he has been exercising over the past couple of months. He says pain usually wakes him in about 5 AM and is aching times and at other times feels like gas. It Will usually be relieved somewhat after eating breakfast and having a bowel movement but then gradually returns during the morning hours. He does feel that walking on the treadmill etc. seems to exacerbate his pain as well. Generally after eating lunch he'll have an increase in discomfort again and then feels better at night after he takes a hot bath and is able to lie in bed especially on his right side. He had been seen by Dr. Jonny Ruiz in his Nexium was increased to twice daily which has not changed his symptoms. He also had an upper about ultrasound done on 06/14/2012 and this was negative. Labs done 05/22/2012 showed a normal CBC and hepatic panel.    Review of Systems  Constitutional: Negative.   HENT: Negative.   Eyes: Negative.   Respiratory: Negative.   Cardiovascular: Negative.   Gastrointestinal: Positive for abdominal pain.   Genitourinary: Negative.   Musculoskeletal: Negative.   Neurological: Negative.   Hematological: Negative.   Psychiatric/Behavioral: Negative.    Outpatient Prescriptions Prior to Visit  Medication Sig Dispense Refill  . aspirin 325 MG tablet Take 325 mg by mouth daily.        Marland Kitchen atorvastatin (LIPITOR) 20 MG tablet Take 20 mg by mouth daily.      . fluticasone (FLONASE) 50 MCG/ACT nasal spray Place 2 sprays into the nose daily as needed.      . gabapentin (NEURONTIN) 100 MG capsule Take 100 mg by mouth 3 (three) times daily.      . hyoscyamine (LEVSIN SL) 0.125 MG SL tablet Place 1 tablet (0.125 mg total) under the tongue every 4 (four) hours as needed for cramping.  30 tablet  2  . [DISCONTINUED] esomeprazole (NEXIUM) 40 MG capsule Take 1 capsule (40 mg total) by mouth daily before supper.  90 capsule  3   Last reviewed on 06/20/2012 11:02 AM by Sammuel Cooper, PA      Patient Active Problem List  Diagnosis  . HYPERLIPIDEMIA  . OCULAR MIGRAINE  . HEARING LOSS, RIGHT EAR  . HYPERTENSION  . MYOCARDIAL INFARCTION, HX OF  . CORONARY ARTERY DISEASE  . Unspecified transient cerebral ischemia  . AMNESIA, TRANSIENT GLOBAL  . Atherosclerosis of aorta  . ALLERGIC RHINITIS  . PEPTIC ULCER DISEASE  . INGUINAL HERNIA, RIGHT  . BENIGN PROSTATIC HYPERTROPHY  . PROSTATITIS, CHRONIC  . FROZEN  RIGHT SHOULDER  . FATIGUE  . CHEST PAIN  . ABDOMINAL PAIN  . EPIGASTRIC PAIN  . MYOCARDIAL PERFUSION SCAN, WITH STRESS TEST, ABNORMAL  . DIVERTICULITIS, HX OF  . Preventative health care  . Anxiety  . Bladder neck obstruction  . Conjunctivitis, acute, bilateral  . Left groin pain  . Abdominal  pain, other specified site   History  Substance Use Topics  . Smoking status: Former Smoker    Quit date: 07/03/1980  . Smokeless tobacco: Never Used  . Alcohol Use: Yes     Comment: a few beers or glasses of wine a week   family history includes Alcohol abuse in his brother and father; Cancer in  his father and unspecified family member; Heart disease in an unspecified family member; and Hypertension in an unspecified family member.  Objective:   Physical Exam well-developed older white male in no acute distress, somewhat hard of hearing, accompanied by his wife blood pressure 118/62 pulse 98 height 5 foot 7 weight 154. HEENT; nontraumatic normocephalic EOMI PERRLA sclera anicteric he does wear hearing aids, Neck; Supple no JVD, Cardiovascular; regular rate and rhythm with S1-S2 no murmur or gallop, Pulmonary; clear bilaterally, Abdomen; soft bowel sounds are active there is no palpable mass or hepatosplenomegaly he is really nontender on exam, no chest wall tenderness. Rectal ;exam not done, Extremities; no clubbing cyanosis or edema skin warm and dry, Psych; mood and affect normal and appropriate.        Assessment & Plan:  #65 66 year old male with 2 month history of gradually progressive left mid abdominal pain. Etiology is not clear, he may have a small during diverticulitis, but will need to rule out other intra-abdominal inflammatory process. #2 diverticulosis #3 coronary artery disease #4 history of gastritis and peptic ulcer disease #5 hypertension #6 hyperlipidemia #7 BPH  Plan; Will schedule for CT scan of the abdomen and pelvis within the next 24 hours this will be done with contrast. Further plans pending results of CT scan. If CT is unrevealing he will likely need endoscopic evaluation colonoscopy and possible upper endoscopy.  Continue Nexium but decrease to 40 mg once daily

## 2012-06-20 NOTE — Patient Instructions (Addendum)
Take Nexium 40 mg , 1 capsule by mouth 30 min before breakfast.   You have been scheduled for a CT scan of the abdomen and pelvis at [facility] ([address]---this is in the same building as Architectural [facility]).   You are scheduled on today 06-20-2012 at 1:45 PM . You should arrive at 1:30 PM  prior to your appointment time for registration. Please follow the written instructions below on the day of your exam:  WARNING: IF YOU ARE ALLERGIC TO IODINE/X-RAY DYE, PLEASE NOTIFY RADIOLOGY IMMEDIATELY AT [phone]! YOU WILL BE GIVEN A 13 HOUR PREMEDICATION PREP.  1) Do not eat or drink anything until after the CT Scan. 2) You have been given 2 bottles of oral contrast to drink. The solution may taste  better if refrigerated, but do NOT add ice or any other liquid to this solution. Shake  well before drinking.    Drink 1 bottle of contrast @ 11:45 Am (2 hours prior to your exam)  Drink 1 bottle of contrast @ 12:45 PM  (1 hour prior to your exam)  You may take any medications as prescribed with a small amount of water except for the following: Metformin, Glucophage, Glucovance, Avandamet, Riomet, Fortamet, Actoplus Met, Janumet, Glumetza or Metaglip. The above medications must be held the day of the exam AND 48 hours after the exam.  The purpose of you drinking the oral contrast is to aid in the visualization of your intestinal tract. The contrast solution may cause some diarrhea. Before your exam is started, you will be given a small amount of fluid to drink. Depending on your individual set of symptoms, you may also receive an intravenous injection of x-ray contrast/dye. Plan on being at [facility] for 30 minutes or long, depending on the type of exam you are having performed.  If you have any questions regarding your exam or if you need to reschedule, you may call the CT department at [phone] between the hours of 8:00 am and 5:00 pm,  Monday-Friday.  ________________________________________________________________________

## 2012-06-21 ENCOUNTER — Telehealth: Payer: Self-pay | Admitting: Physician Assistant

## 2012-06-21 NOTE — Telephone Encounter (Signed)
Patient asking about CT scan results. Informed him that Mike Gip, PA and Dr. Russella Dar are reviewing them and we will call when results are reviewed.

## 2012-06-24 ENCOUNTER — Encounter: Payer: Self-pay | Admitting: Physician Assistant

## 2012-06-25 ENCOUNTER — Other Ambulatory Visit: Payer: Self-pay | Admitting: *Deleted

## 2012-06-25 ENCOUNTER — Encounter: Payer: Self-pay | Admitting: Physician Assistant

## 2012-06-25 DIAGNOSIS — R109 Unspecified abdominal pain: Secondary | ICD-10-CM

## 2012-07-08 ENCOUNTER — Ambulatory Visit (AMBULATORY_SURGERY_CENTER): Payer: Medicare Other | Admitting: *Deleted

## 2012-07-08 VITALS — Ht 67.5 in | Wt 156.6 lb

## 2012-07-08 DIAGNOSIS — R109 Unspecified abdominal pain: Secondary | ICD-10-CM

## 2012-07-08 MED ORDER — MOVIPREP 100 G PO SOLR: ORAL | Status: DC

## 2012-07-23 ENCOUNTER — Encounter: Payer: Self-pay | Admitting: Gastroenterology

## 2012-07-23 ENCOUNTER — Ambulatory Visit (AMBULATORY_SURGERY_CENTER): Payer: Medicare Other | Admitting: Gastroenterology

## 2012-07-23 VITALS — BP 115/69 | HR 85 | Temp 98.9°F | Resp 20 | Ht 67.0 in | Wt 156.0 lb

## 2012-07-23 DIAGNOSIS — I252 Old myocardial infarction: Secondary | ICD-10-CM | POA: Diagnosis not present

## 2012-07-23 DIAGNOSIS — R109 Unspecified abdominal pain: Secondary | ICD-10-CM

## 2012-07-23 DIAGNOSIS — I10 Essential (primary) hypertension: Secondary | ICD-10-CM | POA: Diagnosis not present

## 2012-07-23 DIAGNOSIS — R933 Abnormal findings on diagnostic imaging of other parts of digestive tract: Secondary | ICD-10-CM

## 2012-07-23 DIAGNOSIS — F959 Tic disorder, unspecified: Secondary | ICD-10-CM | POA: Diagnosis not present

## 2012-07-23 DIAGNOSIS — I2510 Atherosclerotic heart disease of native coronary artery without angina pectoris: Secondary | ICD-10-CM | POA: Diagnosis not present

## 2012-07-23 DIAGNOSIS — Z1211 Encounter for screening for malignant neoplasm of colon: Secondary | ICD-10-CM | POA: Diagnosis not present

## 2012-07-23 DIAGNOSIS — K279 Peptic ulcer, site unspecified, unspecified as acute or chronic, without hemorrhage or perforation: Secondary | ICD-10-CM | POA: Diagnosis not present

## 2012-07-23 MED ORDER — SODIUM CHLORIDE 0.9 % IV SOLN: 500.0000 mL | INTRAVENOUS | Status: DC

## 2012-07-23 NOTE — Op Note (Signed)
[facility] [address] [place], [address]   ENDOSCOPY PROCEDURE REPORT  PATIENT: [name] [name], [name] [name]  MR#: [id number] BIRTHDATE: Feb 26, 1946 , 66  yrs. old GENDER: Male ENDOSCOPIST: Meryl Dare, MD, [facility] PROCEDURE DATE:  07/23/2012 PROCEDURE:  EGD, diagnostic ASA CLASS:     Class II INDICATIONS:  abdominal pain in upper left quadrant, abnormal CT of the GI tract-thickened gastric wall. MEDICATIONS: residual sedation effect present-prior procedure, MAC sedation, administered by CRNA, propofol (Diprivan) 100mg  IV TOPICAL ANESTHETIC: none DESCRIPTION OF PROCEDURE: After the risks benefits and alternatives of the procedure were thoroughly explained, informed consent was obtained.  The [facility] GIF-H180 [id number] endoscope was introduced through the mouth and advanced to the second portion of the duodenum. Without limitations.  The instrument was slowly withdrawn as the mucosa was fully examined.   ESOPHAGUS: The mucosa of the esophagus appeared normal. STOMACH: The mucosa of the stomach appeared normal. DUODENUM: The duodenal mucosa showed no abnormalities in the bulb and second portion of the duodenum.  Retroflexed views revealed no abnormalities.     The scope was then withdrawn from the patient and the procedure completed.  COMPLICATIONS: There were no complications.  ENDOSCOPIC IMPRESSION: 1.   EGD appeared normal  RECOMMENDATIONS: 1.  Continue PPI 2.  Possible musculoskeletal pain-trial of Advil or Tylenol    eSigned:  Meryl Dare, MD, [facility] 07/23/2012 3:28 PM

## 2012-07-23 NOTE — Op Note (Signed)
[facility] [address] [place], [address]   COLONOSCOPY PROCEDURE REPORT  PATIENT: [name] [name], [name] [name]  MR#: [id number] BIRTHDATE: 04-07-1946 , 66  yrs. old GENDER: Male ENDOSCOPIST: Meryl Dare, MD, [facility] PROCEDURE DATE:  07/23/2012 PROCEDURE:   Colonoscopy, diagnostic ASA CLASS:   Class II INDICATIONS:an abnormal CT of the TI and abdominal pain in the upper left quadrant. MEDICATIONS: MAC sedation, administered by CRNA and propofol (Diprivan) 200mg  IV DESCRIPTION OF PROCEDURE:   After the risks benefits and alternatives of the procedure were thoroughly explained, informed consent was obtained.  A digital rectal exam revealed no abnormalities of the rectum.   The [facility] CF-H180AL [id number]  endoscope was introduced through the anus and advanced to the terminal ileum which was intubated for a short distance. No adverse events experienced.   The quality of the prep was good, using MoviPrep The instrument was then slowly withdrawn as the colon was fully examined.  COLON FINDINGS: Moderate diverticulosis was noted in the descending colon and sigmoid colon.   The mucosa appeared normal in the terminal ileum.   The colon was otherwise normal.  There was no diverticulosis, inflammation, polyps or cancers unless previously stated.  Retroflexed views revealed no abnormalities. The time to cecum=2 minutes 45 seconds.  Withdrawal time=10 minutes 42 seconds. The scope was withdrawn and the procedure completed.  COMPLICATIONS: There were no complications.  ENDOSCOPIC IMPRESSION: 1.   Moderate diverticulosis was noted in the descending colon and sigmoid colon 2.   Normal mucosa in the terminal ileum  RECOMMENDATIONS: 1.  High fiber diet with liberal fluid intake. 2.  Continue current colorectal screening recommendations for "routine risk" patients with a repeat colonoscopy in 10 years.  eSigned:  Meryl Dare, MD, [facility] 07/23/2012 3:25 PM

## 2012-07-23 NOTE — Progress Notes (Signed)
Patient did not experience any of the following events: a burn prior to discharge; a fall within the facility; wrong site/side/patient/procedure/implant event; or a hospital transfer or hospital admission upon discharge from the facility. (G8907) Patient did not have preoperative order for IV antibiotic SSI prophylaxis. (G8918)  

## 2012-07-23 NOTE — Patient Instructions (Addendum)
Discharge instructions given with verbal understanding. Handouts on diverticulosis and a high fiber diet given. Resume previous medications.YOU HAD AN ENDOSCOPIC PROCEDURE TODAY AT THE [facility]: Refer to the procedure report that was given to you for any specific questions about what was found during the examination.  If the procedure report does not answer your questions, please call your gastroenterologist to clarify.  If you requested that your care partner not be given the details of your procedure findings, then the procedure report has been included in a sealed envelope for you to review at your convenience later.  YOU SHOULD EXPECT: Some feelings of bloating in the abdomen. Passage of more gas than usual.  Walking can help get rid of the air that was put into your GI tract during the procedure and reduce the bloating. If you had a lower endoscopy (such as a colonoscopy or flexible sigmoidoscopy) you may notice spotting of blood in your stool or on the toilet paper. If you underwent a bowel prep for your procedure, then you may not have a normal bowel movement for a few days.  DIET: Your first meal following the procedure should be a light meal and then it is ok to progress to your normal diet.  A half-sandwich or bowl of soup is an example of a good first meal.  Heavy or fried foods are harder to digest and may make you feel nauseous or bloated.  Likewise meals heavy in dairy and vegetables can cause extra gas to form and this can also increase the bloating.  Drink plenty of fluids but you should avoid alcoholic beverages for 24 hours.  ACTIVITY: Your care partner should take you home directly after the procedure.  You should plan to take it easy, moving slowly for the rest of the day.  You can resume normal activity the day after the procedure however you should NOT DRIVE or use heavy machinery for 24 hours (because of the sedation medicines used during the test).    SYMPTOMS TO  REPORT IMMEDIATELY: A gastroenterologist can be reached at any hour.  During normal business hours, 8:30 AM to 5:00 PM Monday through Friday, call [phone].  After hours and on weekends, please call the GI answering service at [phone] who will take a message and have the physician on call contact you.   Following lower endoscopy (colonoscopy or flexible sigmoidoscopy):  Excessive amounts of blood in the stool  Significant tenderness or worsening of abdominal pains  Swelling of the abdomen that is new, acute  Fever of 100F or higher  FOLLOW UP: If any biopsies were taken you will be contacted by phone or by letter within the next 1-3 weeks.  Call your gastroenterologist if you have not heard about the biopsies in 3 weeks.  Our staff will call the home number listed on your records the next business day following your procedure to check on you and address any questions or concerns that you may have at that time regarding the information given to you following your procedure. This is a courtesy call and so if there is no answer at the home number and we have not heard from you through the emergency physician on call, we will assume that you have returned to your regular daily activities without incident.  SIGNATURES/CONFIDENTIALITY: You and/or your care partner have signed paperwork which will be entered into your electronic medical record.  These signatures attest to the fact that that the information above on your   After Visit Summary has been reviewed and is understood.  Full responsibility of the confidentiality of this discharge information lies with you and/or your care-partner.   

## 2012-07-23 NOTE — Progress Notes (Signed)
Lidocaine-40mg IV prior to Propofol InductionPropofol given over incremental dosages 

## 2012-07-24 ENCOUNTER — Telehealth: Payer: Self-pay

## 2012-07-24 NOTE — Telephone Encounter (Signed)
  Follow up Call-  Call back number 07/23/2012  Post procedure Call Back phone  # [phone]  Permission to leave phone message Yes     Patient questions:  Do you have a fever, pain , or abdominal swelling? no Pain Score  0 *  Have you tolerated food without any problems? yes  Have you been able to return to your normal activities? yes  Do you have any questions about your discharge instructions: Diet   no Medications  no Follow up visit  no  Do you have questions or concerns about your Care? no  Actions: * If pain score is 4 or above: No action needed, pain <4.

## 2012-10-10 DIAGNOSIS — N401 Enlarged prostate with lower urinary tract symptoms: Secondary | ICD-10-CM | POA: Diagnosis not present

## 2012-10-10 DIAGNOSIS — N411 Chronic prostatitis: Secondary | ICD-10-CM | POA: Diagnosis not present

## 2012-10-22 DIAGNOSIS — Z85828 Personal history of other malignant neoplasm of skin: Secondary | ICD-10-CM | POA: Diagnosis not present

## 2012-10-22 DIAGNOSIS — L570 Actinic keratosis: Secondary | ICD-10-CM | POA: Diagnosis not present

## 2012-10-22 DIAGNOSIS — L578 Other skin changes due to chronic exposure to nonionizing radiation: Secondary | ICD-10-CM | POA: Diagnosis not present

## 2012-10-22 DIAGNOSIS — L820 Inflamed seborrheic keratosis: Secondary | ICD-10-CM | POA: Diagnosis not present

## 2012-11-15 DIAGNOSIS — L980 Pyogenic granuloma: Secondary | ICD-10-CM | POA: Diagnosis not present

## 2012-11-15 DIAGNOSIS — Z880 Allergy status to penicillin: Secondary | ICD-10-CM | POA: Diagnosis not present

## 2012-11-15 DIAGNOSIS — R0789 Other chest pain: Secondary | ICD-10-CM | POA: Diagnosis not present

## 2012-11-15 DIAGNOSIS — R000 Tachycardia, unspecified: Secondary | ICD-10-CM | POA: Diagnosis not present

## 2012-11-15 DIAGNOSIS — K219 Gastro-esophageal reflux disease without esophagitis: Secondary | ICD-10-CM | POA: Diagnosis not present

## 2012-11-15 DIAGNOSIS — R079 Chest pain, unspecified: Secondary | ICD-10-CM | POA: Diagnosis not present

## 2012-11-15 DIAGNOSIS — Z9889 Other specified postprocedural states: Secondary | ICD-10-CM | POA: Diagnosis not present

## 2012-11-15 DIAGNOSIS — E876 Hypokalemia: Secondary | ICD-10-CM | POA: Diagnosis not present

## 2012-11-15 DIAGNOSIS — I252 Old myocardial infarction: Secondary | ICD-10-CM | POA: Diagnosis not present

## 2012-11-15 DIAGNOSIS — R911 Solitary pulmonary nodule: Secondary | ICD-10-CM | POA: Diagnosis not present

## 2012-11-15 DIAGNOSIS — I2510 Atherosclerotic heart disease of native coronary artery without angina pectoris: Secondary | ICD-10-CM | POA: Diagnosis not present

## 2012-11-15 DIAGNOSIS — E7800 Pure hypercholesterolemia, unspecified: Secondary | ICD-10-CM | POA: Diagnosis not present

## 2012-11-15 DIAGNOSIS — Z7982 Long term (current) use of aspirin: Secondary | ICD-10-CM | POA: Diagnosis not present

## 2012-11-15 DIAGNOSIS — Z79899 Other long term (current) drug therapy: Secondary | ICD-10-CM | POA: Diagnosis not present

## 2012-11-15 DIAGNOSIS — Z9861 Coronary angioplasty status: Secondary | ICD-10-CM | POA: Diagnosis not present

## 2012-11-16 DIAGNOSIS — R0789 Other chest pain: Secondary | ICD-10-CM | POA: Diagnosis not present

## 2012-11-16 DIAGNOSIS — E7800 Pure hypercholesterolemia, unspecified: Secondary | ICD-10-CM | POA: Diagnosis not present

## 2012-11-16 DIAGNOSIS — R911 Solitary pulmonary nodule: Secondary | ICD-10-CM | POA: Diagnosis not present

## 2012-11-16 DIAGNOSIS — Z79899 Other long term (current) drug therapy: Secondary | ICD-10-CM | POA: Diagnosis not present

## 2012-11-16 DIAGNOSIS — I252 Old myocardial infarction: Secondary | ICD-10-CM | POA: Diagnosis not present

## 2012-11-16 DIAGNOSIS — K219 Gastro-esophageal reflux disease without esophagitis: Secondary | ICD-10-CM | POA: Diagnosis not present

## 2012-11-16 DIAGNOSIS — Z9889 Other specified postprocedural states: Secondary | ICD-10-CM | POA: Diagnosis not present

## 2012-11-16 DIAGNOSIS — I2584 Coronary atherosclerosis due to calcified coronary lesion: Secondary | ICD-10-CM | POA: Diagnosis not present

## 2012-11-16 DIAGNOSIS — R079 Chest pain, unspecified: Secondary | ICD-10-CM | POA: Diagnosis not present

## 2012-11-16 DIAGNOSIS — Z9861 Coronary angioplasty status: Secondary | ICD-10-CM | POA: Diagnosis not present

## 2012-11-16 DIAGNOSIS — I2510 Atherosclerotic heart disease of native coronary artery without angina pectoris: Secondary | ICD-10-CM | POA: Diagnosis not present

## 2012-12-20 DIAGNOSIS — I2510 Atherosclerotic heart disease of native coronary artery without angina pectoris: Secondary | ICD-10-CM | POA: Diagnosis not present

## 2012-12-26 DIAGNOSIS — E785 Hyperlipidemia, unspecified: Secondary | ICD-10-CM | POA: Diagnosis not present

## 2012-12-26 DIAGNOSIS — I2510 Atherosclerotic heart disease of native coronary artery without angina pectoris: Secondary | ICD-10-CM | POA: Diagnosis not present

## 2012-12-26 DIAGNOSIS — I252 Old myocardial infarction: Secondary | ICD-10-CM | POA: Diagnosis not present

## 2012-12-26 DIAGNOSIS — R079 Chest pain, unspecified: Secondary | ICD-10-CM | POA: Diagnosis not present

## 2013-01-13 DIAGNOSIS — I252 Old myocardial infarction: Secondary | ICD-10-CM | POA: Diagnosis not present

## 2013-03-13 DIAGNOSIS — Z23 Encounter for immunization: Secondary | ICD-10-CM | POA: Diagnosis not present

## 2013-04-23 IMAGING — CT CT ABD-PELV W/ CM
2 of 4 series · 16 of 46 positions shown, 18 images · IV contrast (Omnipaque 300)
Comparison: Ultrasound of the abdomen of 06/13/2012 and CT of the
abdomen and pelvis of 05/09/2007

CLINICAL DATA: Left sided abdomen pain for 2 months

CT ABDOMEN AND PELVIS WITH CONTRAST
TECHNIQUE: Multidetector CT imaging of the abdomen and pelvis was
performed following the standard protocol during bolus
administration of intravenous contrast.
Contrast: 100mL OMNIPAQUE IOHEXOL 300 MG/ML  SOLN

[Series 2: abd/ pel 5mm · axial · 0.70mm/px · z∈[-474,-59]mm · 13 of 93 slices shown, 15 images]
[im 5/93  soft-tissue]
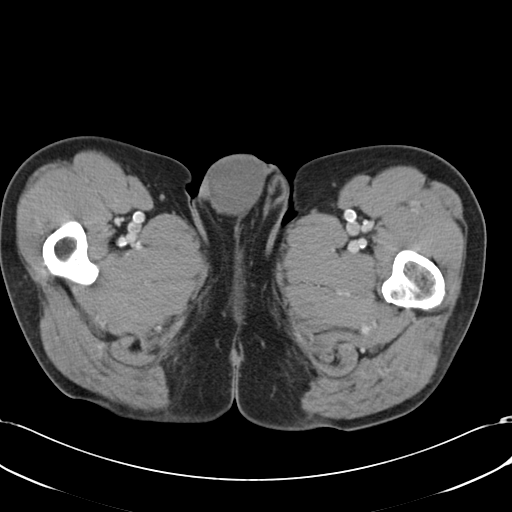
[im 5/93  bone]
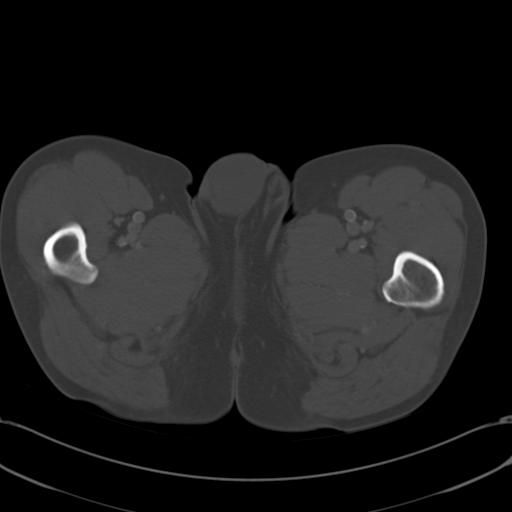
[im 14/93  soft-tissue]
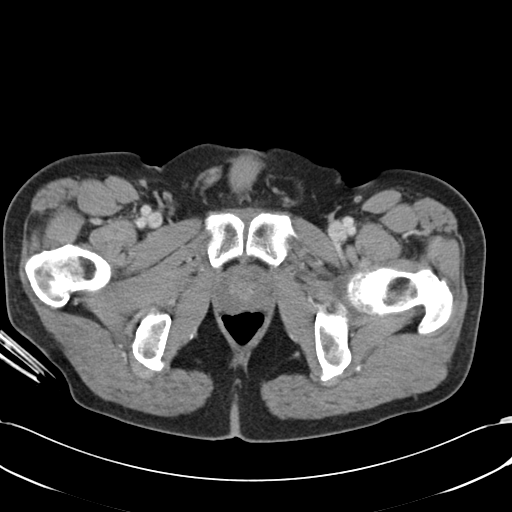
[im 18/93  soft-tissue]
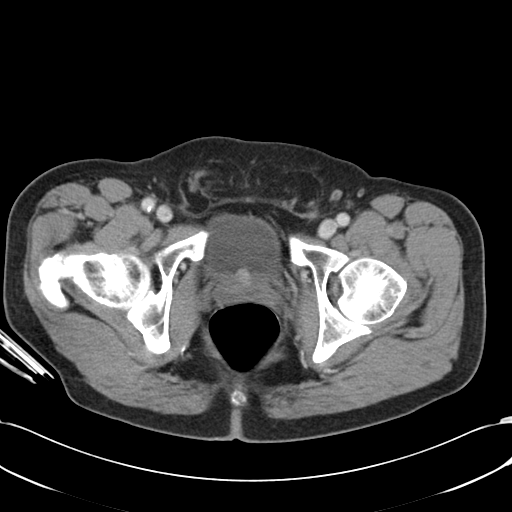
[im 27/93  soft-tissue]
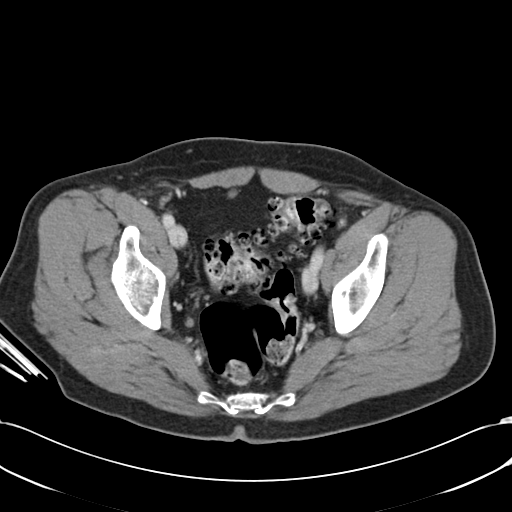
[im 31/93  soft-tissue]
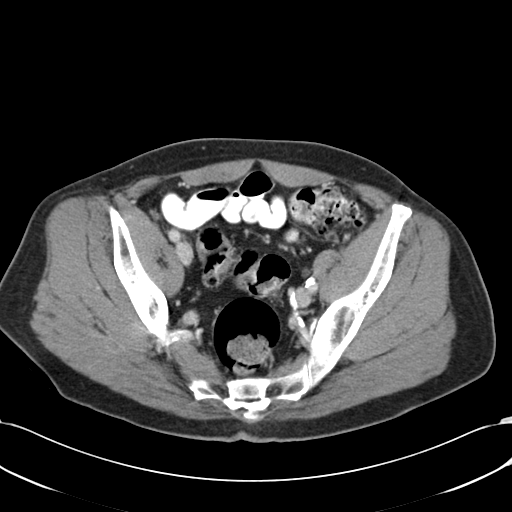
[im 40/93  soft-tissue]
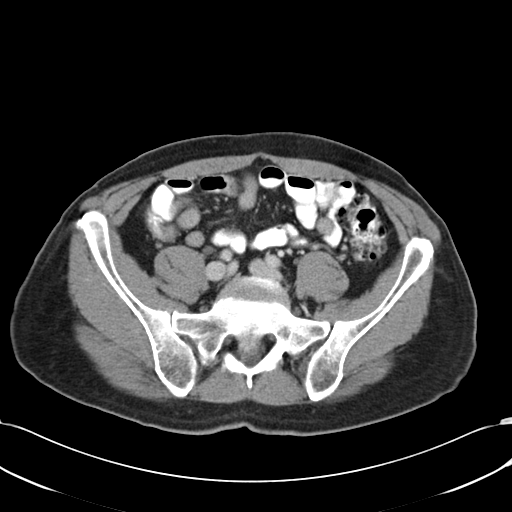
[im 49/93  soft-tissue]
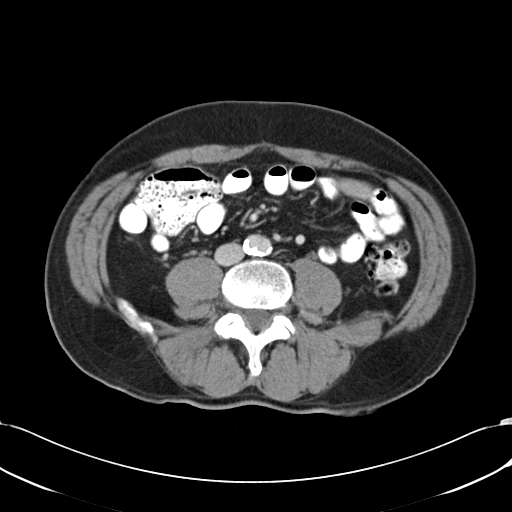
[im 53/93  soft-tissue]
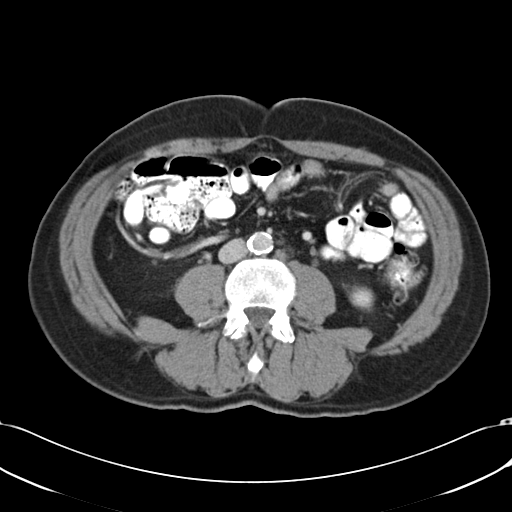
[im 62/93  soft-tissue]
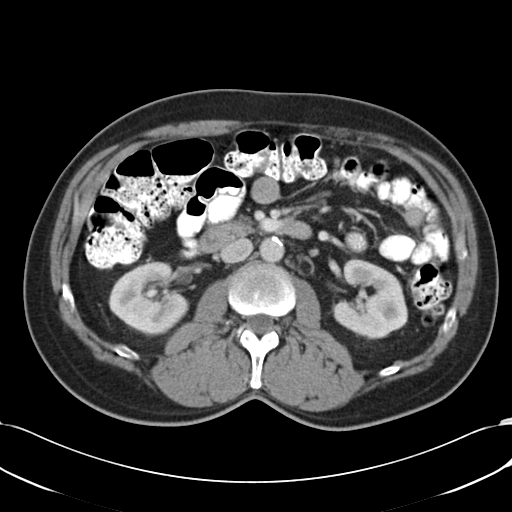
[im 62/93  bone]
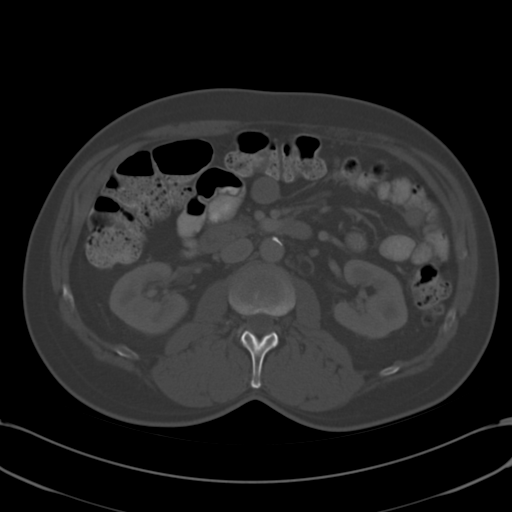
[im 66/93  soft-tissue]
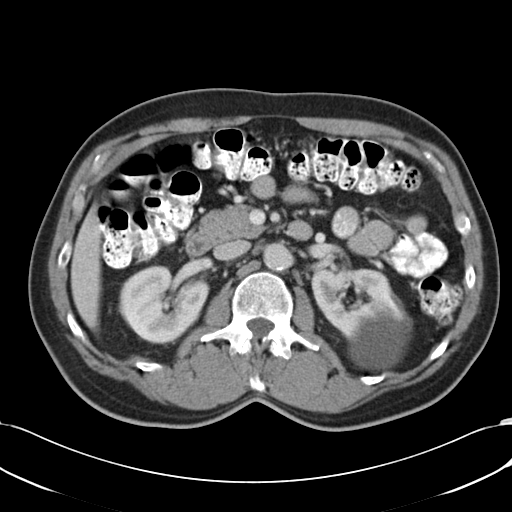
[im 75/93  soft-tissue]
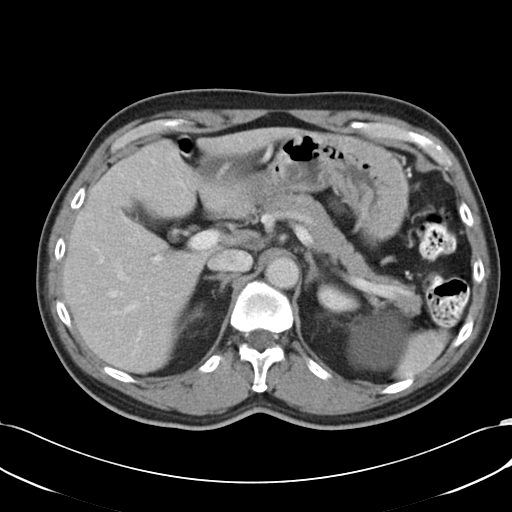
[im 79/93  soft-tissue]
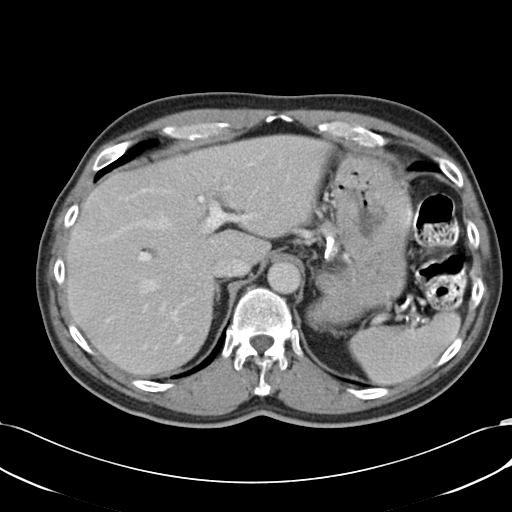
[im 88/93  soft-tissue]
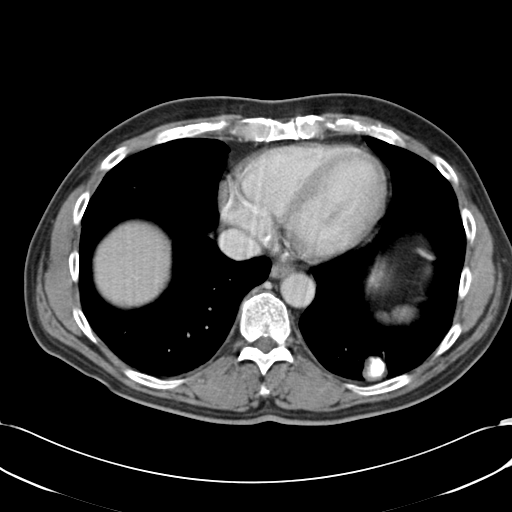

[Series 602: <mpr range> · coronal · 0.94mm/px · 3 of 105 slices shown]
[im 35/105  soft-tissue]
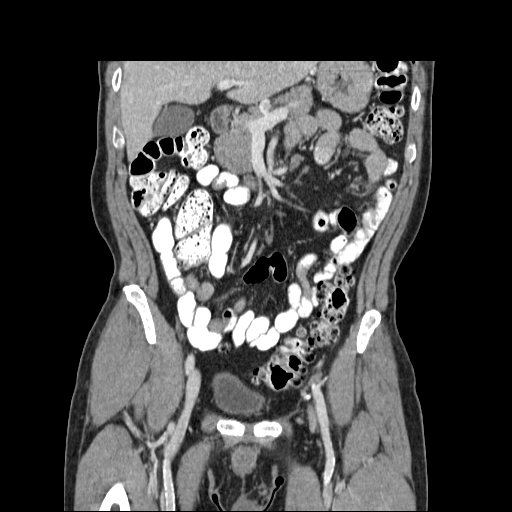
[im 47/105  soft-tissue]
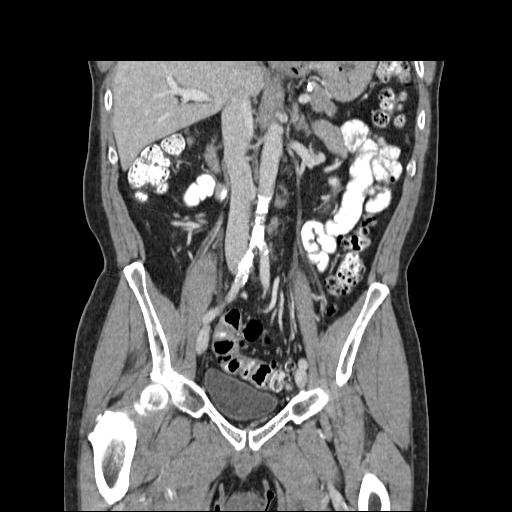
[im 58/105  soft-tissue]
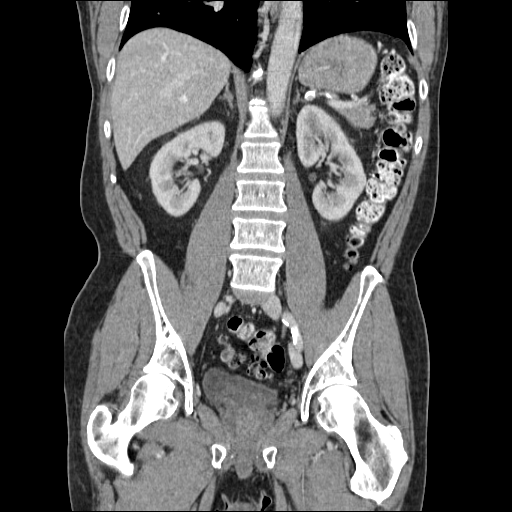

[16 of 46 positions shown; findings below may reference images not displayed]

FINDINGS: The calcified nodular lesion in the left lower lobe is
stable most consistent with granuloma or hamartoma.  The liver
enhances with no focal abnormality and no ductal dilatation is
seen.  No calcified gallstones are noted.  The pancreas is normal
in size and the pancreatic duct is not dilated.  The adrenal glands
and spleen are unremarkable.  The stomach is decompressed and
somewhat thick-walled.  This thick-walled appearance most likely is
due to lack of distention, but an infiltrative process cannot be
excluded.  Clinical correlation is recommended.  A cyst emanates
from the upper pole of the left kidney, measuring 6.8 x 5.8 cm.  No
complicating features are seen.  Otherwise the kidneys enhance with
no calculus or mass and no hydronephrosis is seen.  On delayed
images, the pelvocaliceal systems are unremarkable.  The abdominal
aorta is normal in caliber.  No adenopathy is seen.

The urinary bladder is not well distended.  The prostate is
slightly inhomogeneous and nodular but within normal limits in
size.  Fat enters both inguinal canals. Multiple diverticula are
scattered throughout the rectosigmoid colon and descending colon.
No other colonic abnormality is seen.  The terminal ileum is well
seen and appears normal.  Minimal prominence of the mucosa of the
distal ileum is noted of questionable significance, most likely
simply due to lack of distention.  Clinical correlation is
recommended.  No bony abnormalities seen.
IMPRESSION: 1.  Somewhat thick-walled stomach most likely due to lack of
distention but an infiltrative process would be difficult to
exclude.  Correlate clinically.
2.  Fat enters both inguinal canals.
3.  Slightly inhomogeneous and nodular prostate, but within normal
limits.
4.  Diverticula within the rectosigmoid and descending colon.  No
diverticulitis.
5.  Minimal prominence of mucosa of the distal ileum most likely
due to lack of distention as well.

## 2013-05-14 DIAGNOSIS — M545 Low back pain: Secondary | ICD-10-CM | POA: Diagnosis not present

## 2013-05-27 DIAGNOSIS — M545 Low back pain: Secondary | ICD-10-CM | POA: Diagnosis not present

## 2013-06-17 DIAGNOSIS — I252 Old myocardial infarction: Secondary | ICD-10-CM | POA: Diagnosis not present

## 2013-06-17 DIAGNOSIS — E785 Hyperlipidemia, unspecified: Secondary | ICD-10-CM | POA: Diagnosis not present

## 2013-06-17 DIAGNOSIS — I2510 Atherosclerotic heart disease of native coronary artery without angina pectoris: Secondary | ICD-10-CM | POA: Diagnosis not present

## 2013-07-08 ENCOUNTER — Encounter: Payer: Medicare Other | Admitting: Internal Medicine

## 2013-07-11 ENCOUNTER — Ambulatory Visit (INDEPENDENT_AMBULATORY_CARE_PROVIDER_SITE_OTHER): Payer: Medicare Other | Admitting: Internal Medicine

## 2013-07-11 ENCOUNTER — Other Ambulatory Visit (INDEPENDENT_AMBULATORY_CARE_PROVIDER_SITE_OTHER): Payer: Medicare Other

## 2013-07-11 ENCOUNTER — Encounter: Payer: Self-pay | Admitting: Internal Medicine

## 2013-07-11 VITALS — BP 130/80 | HR 78 | Temp 98.0°F | Ht 68.0 in | Wt 161.0 lb

## 2013-07-11 DIAGNOSIS — N320 Bladder-neck obstruction: Secondary | ICD-10-CM

## 2013-07-11 DIAGNOSIS — F411 Generalized anxiety disorder: Secondary | ICD-10-CM | POA: Diagnosis not present

## 2013-07-11 DIAGNOSIS — Z23 Encounter for immunization: Secondary | ICD-10-CM | POA: Diagnosis not present

## 2013-07-11 DIAGNOSIS — I10 Essential (primary) hypertension: Secondary | ICD-10-CM | POA: Diagnosis not present

## 2013-07-11 DIAGNOSIS — E785 Hyperlipidemia, unspecified: Secondary | ICD-10-CM | POA: Diagnosis not present

## 2013-07-11 DIAGNOSIS — J309 Allergic rhinitis, unspecified: Secondary | ICD-10-CM

## 2013-07-11 DIAGNOSIS — F419 Anxiety disorder, unspecified: Secondary | ICD-10-CM

## 2013-07-11 LAB — CBC WITH DIFFERENTIAL/PLATELET
BASOS ABS: 0 10*3/uL (ref 0.0–0.1)
Basophils Relative: 0.5 % (ref 0.0–3.0)
Eosinophils Absolute: 0.1 10*3/uL (ref 0.0–0.7)
Eosinophils Relative: 1.4 % (ref 0.0–5.0)
HEMATOCRIT: 42 % (ref 39.0–52.0)
Hemoglobin: 14.4 g/dL (ref 13.0–17.0)
LYMPHS ABS: 1.9 10*3/uL (ref 0.7–4.0)
Lymphocytes Relative: 29.2 % (ref 12.0–46.0)
MCHC: 34.4 g/dL (ref 30.0–36.0)
MCV: 93.2 fl (ref 78.0–100.0)
MONOS PCT: 10.8 % (ref 3.0–12.0)
Monocytes Absolute: 0.7 10*3/uL (ref 0.1–1.0)
Neutro Abs: 3.8 10*3/uL (ref 1.4–7.7)
Neutrophils Relative %: 58.1 % (ref 43.0–77.0)
Platelets: 176 10*3/uL (ref 150.0–400.0)
RBC: 4.51 Mil/uL (ref 4.22–5.81)
RDW: 13 % (ref 11.5–14.6)
WBC: 6.5 10*3/uL (ref 4.5–10.5)

## 2013-07-11 LAB — BASIC METABOLIC PANEL
BUN: 10 mg/dL (ref 6–23)
CALCIUM: 8.5 mg/dL (ref 8.4–10.5)
CO2: 30 mEq/L (ref 19–32)
Chloride: 104 mEq/L (ref 96–112)
Creatinine, Ser: 1.1 mg/dL (ref 0.4–1.5)
GFR: 70.48 mL/min (ref >60.00)
Glucose, Bld: 94 mg/dL (ref 70–99)
POTASSIUM: 3.5 meq/L (ref 3.5–5.1)
SODIUM: 142 meq/L (ref 135–145)

## 2013-07-11 LAB — URINALYSIS, ROUTINE W REFLEX MICROSCOPIC
Bilirubin Urine: NEGATIVE
Hgb urine dipstick: NEGATIVE
Ketones, ur: NEGATIVE
Leukocytes, UA: NEGATIVE
Nitrite: NEGATIVE
PH: 6.5 (ref 5.0–8.0)
Specific Gravity, Urine: <=1.005 — AB (ref 1.000–1.030)
Total Protein, Urine: NEGATIVE
URINE GLUCOSE: NEGATIVE
UROBILINOGEN UA: 0.2 (ref 0.0–1.0)

## 2013-07-11 LAB — HEPATIC FUNCTION PANEL
ALK PHOS: 61 U/L (ref 39–117)
ALT: 27 U/L (ref 0–53)
AST: 28 U/L (ref 0–37)
Albumin: 3.8 g/dL (ref 3.5–5.2)
BILIRUBIN TOTAL: 0.7 mg/dL (ref 0.3–1.2)
Bilirubin, Direct: 0.1 mg/dL (ref 0.0–0.3)
Total Protein: 6.7 g/dL (ref 6.0–8.3)

## 2013-07-11 LAB — LIPID PANEL
CHOL/HDL RATIO: 4
Cholesterol: 148 mg/dL (ref 0–200)
HDL: 41.3 mg/dL (ref >39.00)
LDL CALC: 77 mg/dL (ref 0–99)
Triglycerides: 149 mg/dL (ref 0.0–149.0)
VLDL: 29.8 mg/dL (ref 0.0–40.0)

## 2013-07-11 LAB — PSA: PSA: 1.17 ng/mL (ref 0.10–4.00)

## 2013-07-11 LAB — TSH: TSH: 1.68 u[IU]/mL (ref 0.35–5.50)

## 2013-07-11 NOTE — Patient Instructions (Addendum)
You had the new Prevnar pneumonia shot today Please continue all other medications as before, and refills have been done if requested. Please have the pharmacy call with any other refills you may need. Please continue your efforts at being more active, low cholesterol diet, and weight control. You are otherwise up to date with prevention measures today.  Please go to the LAB in the Basement (turn left off the elevator) for the tests to be done today You will be contacted by phone if any changes need to be made immediately.  Otherwise, you will receive a letter about your results with an explanation, but please check with MyChart first.  Please remember to sign up for My Chart if you have not done so, as this will be important to you in the future with finding out test results, communicating by private email, and scheduling acute appointments online when needed.  Please return in 1 year for your yearly visit, or sooner if needed

## 2013-07-11 NOTE — Progress Notes (Signed)
Subjective:    Patient ID: [name] [name], male    DOB: 04/25/1946, 68 y.o.   MRN: [id number]  HPI  Here for yearly f/u;  Overall doing ok;  Pt denies CP, worsening SOB, DOE, wheezing, orthopnea, PND, worsening LE edema, palpitations, dizziness or syncope.  Pt denies neurological change such as new headache, facial or extremity weakness.  Pt denies polydipsia, polyuria, or low sugar symptoms. Pt states overall good compliance with treatment and medications, good tolerability, and has been trying to follow lower cholesterol diet.  Pt denies worsening depressive symptoms, suicidal ideation or panic. No fever, night sweats, wt loss, loss of appetite, or other constitutional symptoms.  Pt states good ability with ADL's, has low fall risk, home safety reviewed and adequate, no other significant changes in hearing or vision, and only occasionally active with exercise.  Pt continues to have recurring leftLBP without change in severity, bowel or bladder change, fever, wt loss,  worsening LE pain/numbness/weakness, gait change or falls. - for eval with Dr Nelva Bush soon, may need ESI per pt  Does have several wks ongoing nasal allergy symptoms with clearish congestion, itch and sneezing, without fever, pain, ST, cough, swelling or wheezing. Past Medical History  Diagnosis Date  . Old myocardial infarction 1989  . HTN (hypertension)   . HLD (hyperlipidemia)   . CAD (coronary artery disease)   . Hearing loss in right ear   . Atherosclerosis of aorta   . Inguinal hernia without mention of obstruction or gangrene, unilateral or unspecified, (not specified as recurrent)   . Chronic prostatitis   . BPH (benign prostatic hyperplasia)   . PUD (peptic ulcer disease)   . Diverticulitis   . Allergic rhinitis   . DJD (degenerative joint disease), lumbosacral   . Transient amnesia   . Ocular migraine   . Anxiety 05/01/2011   Past Surgical History  Procedure Laterality Date  . Inguinal hernia repair    . Appendectomy     . Coronary stent placement      reports that he quit smoking about 33 years ago. He has never used smokeless tobacco. He reports that he drinks alcohol. He reports that he does not use illicit drugs. family history includes Alcohol abuse in his brother and father; Cancer in his father and another family member; Heart disease in an other family member; Hypertension in an other family member. There is no history of Colon cancer or Stomach cancer. Allergies  Allergen Reactions  . Niacin     Hot flashes  . Penicillins     As child: muscles stiffened   Current Outpatient Prescriptions on File Prior to Visit  Medication Sig Dispense Refill  . atorvastatin (LIPITOR) 20 MG tablet Take 20 mg by mouth daily.      Marland Kitchen esomeprazole (NEXIUM) 40 MG capsule Take 40 mg by mouth daily.       . fluticasone (FLONASE) 50 MCG/ACT nasal spray Place 2 sprays into the nose daily as needed.      . hyoscyamine (LEVSIN SL) 0.125 MG SL tablet Place 1 tablet (0.125 mg total) under the tongue every 4 (four) hours as needed for cramping.  30 tablet  2   No current facility-administered medications on file prior to visit.    Review of Systems Constitutional: Negative for diaphoresis, activity change, appetite change or unexpected weight change.  HENT: Negative for hearing loss, ear pain, facial swelling, mouth sores and neck stiffness.   Eyes: Negative for pain, redness and visual disturbance.  Respiratory: Negative for shortness of breath and wheezing.   Cardiovascular: Negative for chest pain and palpitations.  Gastrointestinal: Negative for diarrhea, blood in stool, abdominal distention or other pain Genitourinary: Negative for hematuria, flank pain or change in urine volume.  Musculoskeletal: Negative for myalgias and joint swelling.  Skin: Negative for color change and wound.  Neurological: Negative for syncope and numbness. other than noted Hematological: Negative for adenopathy.  Psychiatric/Behavioral:  Negative for hallucinations, self-injury, decreased concentration and agitation.      Objective:   Physical Exam BP 130/80  Pulse 78  Temp(Src) 98 F (36.7 C) (Oral)  Ht 5\' 8"  (1.727 m)  Wt 161 lb (73.029 kg)  BMI 24.49 kg/m2 VS noted,  Constitutional: Pt is oriented to person, place, and time. Appears well-developed and well-nourished.  Head: Normocephalic and atraumatic.  Right Ear: External ear normal.  Left Ear: External ear normal.  Nose: Nose normal.  Mouth/Throat: Oropharynx is clear and moist.  Eyes: Conjunctivae and EOM are normal. Pupils are equal, round, and reactive to light.  Neck: Normal range of motion. Neck supple. No JVD present. No tracheal deviation present.  Cardiovascular: Normal rate, regular rhythm, normal heart sounds and intact distal pulses.   Pulmonary/Chest: Effort normal and breath sounds normal.  Abdominal: Soft. Bowel sounds are normal. There is no tenderness. No HSM  Musculoskeletal: Normal range of motion. Exhibits no edema.  Lymphadenopathy:  Has no cervical adenopathy.  Neurological: Pt is alert and oriented to person, place, and time. Pt has normal reflexes. No cranial nerve deficit.  Skin: Skin is warm and dry. No rash noted.  Psychiatric:  Has  normal mood and affect. Behavior is normal. mild nervous, not depressed affect    Assessment & Plan:

## 2013-07-11 NOTE — Progress Notes (Signed)
Pre visit review using our clinic review tool, if applicable. No additional management support is needed unless otherwise documented below in the visit note. 

## 2013-07-13 NOTE — Assessment & Plan Note (Signed)
stable overall by history and exam, recent data reviewed with pt, and pt to continue medical treatment as before,  to f/u any worsening symptoms or concerns Lab Results  Component Value Date   WBC 6.5 07/11/2013   HGB 14.4 07/11/2013   HCT 42.0 07/11/2013   PLT 176.0 07/11/2013   GLUCOSE 94 07/11/2013   CHOL 148 07/11/2013   TRIG 149.0 07/11/2013   HDL 41.30 07/11/2013   LDLDIRECT 116.9 04/27/2010   LDLCALC 77 07/11/2013   ALT 27 07/11/2013   AST 28 07/11/2013   NA 142 07/11/2013   K 3.5 07/11/2013   CL 104 07/11/2013   CREATININE 1.1 07/11/2013   BUN 10 07/11/2013   CO2 30 07/11/2013   TSH 1.68 07/11/2013   PSA 1.17 07/11/2013   INR 1.0 ratio 06/03/2010   HGBA1C 5.7 03/19/2009

## 2013-07-13 NOTE — Assessment & Plan Note (Addendum)
stable overall by history and exam, recent data reviewed with pt, and pt to continue medical treatment as before,  to f/u any worsening symptoms or concerns BP Readings from Last 3 Encounters:  07/11/13 130/80  07/23/12 115/69  06/20/12 118/62   Note:  Total time for pt hx, exam, review of record with pt in the room, determination of diagnoses and plan for further eval and tx is > 40 min, with over 50% spent in coordination and counseling of patient

## 2013-07-13 NOTE — Assessment & Plan Note (Signed)
Also for psa as he is due, o/w stable 

## 2013-07-13 NOTE — Assessment & Plan Note (Signed)
For re-start med,  to f/u any worsening symptoms or concerns j

## 2013-07-13 NOTE — Assessment & Plan Note (Signed)
stable overall by history and exam, recent data reviewed with pt, and pt to continue medical treatment as before,  to f/u any worsening symptoms or concerns Lab Results  Component Value Date   LDLCALC 77 07/11/2013

## 2013-07-23 DIAGNOSIS — M545 Low back pain, unspecified: Secondary | ICD-10-CM | POA: Diagnosis not present

## 2013-07-23 DIAGNOSIS — M5137 Other intervertebral disc degeneration, lumbosacral region: Secondary | ICD-10-CM | POA: Diagnosis not present

## 2013-07-24 DIAGNOSIS — C44319 Basal cell carcinoma of skin of other parts of face: Secondary | ICD-10-CM | POA: Diagnosis not present

## 2013-07-24 DIAGNOSIS — D485 Neoplasm of uncertain behavior of skin: Secondary | ICD-10-CM | POA: Diagnosis not present

## 2013-08-12 DIAGNOSIS — M545 Low back pain, unspecified: Secondary | ICD-10-CM | POA: Diagnosis not present

## 2013-08-12 DIAGNOSIS — M5137 Other intervertebral disc degeneration, lumbosacral region: Secondary | ICD-10-CM | POA: Diagnosis not present

## 2013-09-04 DIAGNOSIS — M5137 Other intervertebral disc degeneration, lumbosacral region: Secondary | ICD-10-CM | POA: Diagnosis not present

## 2013-09-16 DIAGNOSIS — M47817 Spondylosis without myelopathy or radiculopathy, lumbosacral region: Secondary | ICD-10-CM | POA: Diagnosis not present

## 2013-09-18 DIAGNOSIS — C44319 Basal cell carcinoma of skin of other parts of face: Secondary | ICD-10-CM | POA: Diagnosis not present

## 2013-09-30 DIAGNOSIS — M47817 Spondylosis without myelopathy or radiculopathy, lumbosacral region: Secondary | ICD-10-CM | POA: Diagnosis not present

## 2013-12-30 ENCOUNTER — Ambulatory Visit: Payer: Medicare Other | Admitting: Internal Medicine

## 2014-01-15 DIAGNOSIS — R109 Unspecified abdominal pain: Secondary | ICD-10-CM | POA: Diagnosis not present

## 2014-01-28 ENCOUNTER — Ambulatory Visit (INDEPENDENT_AMBULATORY_CARE_PROVIDER_SITE_OTHER): Payer: Medicare Other | Admitting: Internal Medicine

## 2014-01-28 ENCOUNTER — Encounter: Payer: Self-pay | Admitting: Internal Medicine

## 2014-01-28 VITALS — BP 120/80 | HR 87 | Temp 98.2°F | Ht 67.0 in | Wt 156.0 lb

## 2014-01-28 DIAGNOSIS — I10 Essential (primary) hypertension: Secondary | ICD-10-CM | POA: Diagnosis not present

## 2014-01-28 DIAGNOSIS — F411 Generalized anxiety disorder: Secondary | ICD-10-CM | POA: Diagnosis not present

## 2014-01-28 DIAGNOSIS — R109 Unspecified abdominal pain: Secondary | ICD-10-CM | POA: Diagnosis not present

## 2014-01-28 DIAGNOSIS — F419 Anxiety disorder, unspecified: Secondary | ICD-10-CM

## 2014-01-28 NOTE — Progress Notes (Signed)
Subjective:    Patient ID: [name] [name], male    DOB: 22-Feb-1946, 68 y.o.   MRN: [id number]  HPI  Here with 6 wks LLQ pain, after increased his walking to 7-10 miles per day, did see urology- no hernia, thought ? MSK.  Stopped walking x 3 wks, wearing jock strp type of support may be helping as well?  Had similar symptoms as a teen.  ? Muscle strain. No LBP, has had ESI per Dr Nelva Bush in past.  Also s/p colonscopy with EGD jan 2014.  Ct scan Denies worsening reflux, abd pain, dysphagia, n/v, bowel change or blood.  Takes PPI nexium daily.   Past Medical History  Diagnosis Date  . Old myocardial infarction 1989  . HTN (hypertension)   . HLD (hyperlipidemia)   . CAD (coronary artery disease)   . Hearing loss in right ear   . Atherosclerosis of aorta   . Inguinal hernia without mention of obstruction or gangrene, unilateral or unspecified, (not specified as recurrent)   . Chronic prostatitis   . BPH (benign prostatic hyperplasia)   . PUD (peptic ulcer disease)   . Diverticulitis   . Allergic rhinitis   . DJD (degenerative joint disease), lumbosacral   . Transient amnesia   . Ocular migraine   . Anxiety 05/01/2011   Past Surgical History  Procedure Laterality Date  . Inguinal hernia repair    . Appendectomy    . Coronary stent placement      reports that he quit smoking about 33 years ago. He has never used smokeless tobacco. He reports that he drinks alcohol. He reports that he does not use illicit drugs. family history includes Alcohol abuse in his brother and father; Cancer in his father and another family member; Heart disease in an other family member; Hypertension in an other family member. There is no history of Colon cancer or Stomach cancer. Allergies  Allergen Reactions  . Niacin     Hot flashes  . Penicillins     As child: muscles stiffened   Current Outpatient Prescriptions on File Prior to Visit  Medication Sig Dispense Refill  . aspirin 81 MG tablet Take 81 mg by  mouth daily.      Marland Kitchen atorvastatin (LIPITOR) 20 MG tablet Take 20 mg by mouth daily.      Marland Kitchen esomeprazole (NEXIUM) 40 MG capsule Take 40 mg by mouth daily.       . fluticasone (FLONASE) 50 MCG/ACT nasal spray Place 2 sprays into the nose daily as needed.       No current facility-administered medications on file prior to visit.    Review of Systems  Constitutional: Negative for unusual diaphoresis or other sweats  HENT: Negative for ringing in ear Eyes: Negative for double vision or worsening visual disturbance.  Respiratory: Negative for choking and stridor.   Gastrointestinal: Negative for vomiting or other signifcant bowel change Genitourinary: Negative for hematuria or decreased urine volume.  Musculoskeletal: Negative for other MSK pain or swelling Skin: Negative for color change and worsening wound.  Neurological: Negative for tremors and numbness other than noted  Psychiatric/Behavioral: Negative for decreased concentration or agitation other than above       Objective:   Physical Exam BP 120/80  Pulse 87  Temp(Src) 98.2 F (36.8 C) (Oral)  Ht 5\' 7"  (1.702 m)  Wt 156 lb (70.761 kg)  BMI 24.43 kg/m2  SpO2 97% VS noted,  Constitutional: Pt appears well-developed, well-nourished.  HENT: Head: NCAT.  Right Ear: External ear normal.  Left Ear: External ear normal.  Eyes: . Pupils are equal, round, and reactive to light. Conjunctivae and EOM are normal Neck: Normal range of motion. Neck supple.  Cardiovascular: Normal rate and regular rhythm.   Pulmonary/Chest: Effort normal and breath sounds normal.  Abd:  Soft, NT, ND, + BS, no hernia, no overlying skin change or swelling Neurological: Pt is alert. Not confused , motor grossly intact Skin: Skin is warm. No rash, No LE edema, no groin LA Left hip with FROM, no pain elicited on full flexion/int/ext rotation Psychiatric: Pt behavior is normal. No agitation. 1-2+ nervous     Assessment & Plan:

## 2014-01-28 NOTE — Patient Instructions (Addendum)
Please continue all other medications as before, and refills have been done if requested.  Please have the pharmacy call with any other refills you may need.  Please continue your efforts at being more active, low cholesterol diet, and weight control.  Please keep your appointments with your specialists as you may have planned  Please return in 6 months, or sooner if needed 

## 2014-01-28 NOTE — Progress Notes (Signed)
Pre visit review using our clinic review tool, if applicable. No additional management support is needed unless otherwise documented below in the visit note. 

## 2014-01-29 NOTE — Assessment & Plan Note (Signed)
Situational worse due to pain, declines further tx,  to f/u any worsening symptoms or concerns

## 2014-01-29 NOTE — Assessment & Plan Note (Signed)
stable overall by history and exam, recent data reviewed with pt, and pt to continue medical treatment as before,  to f/u any worsening symptoms or concerns BP Readings from Last 3 Encounters:  01/28/14 120/80  07/11/13 130/80  07/23/12 115/69

## 2014-01-29 NOTE — Assessment & Plan Note (Signed)
Primarily LLQ, exam benign, no hernia or other acute noted,  suspect MSK, would hold on further eval or imaging at this time, pt reassured,  to f/u any worsening symptoms or concerns

## 2014-02-19 DIAGNOSIS — I252 Old myocardial infarction: Secondary | ICD-10-CM | POA: Diagnosis not present

## 2014-02-19 DIAGNOSIS — R079 Chest pain, unspecified: Secondary | ICD-10-CM | POA: Diagnosis not present

## 2014-02-19 DIAGNOSIS — E785 Hyperlipidemia, unspecified: Secondary | ICD-10-CM | POA: Diagnosis not present

## 2014-02-19 DIAGNOSIS — I2510 Atherosclerotic heart disease of native coronary artery without angina pectoris: Secondary | ICD-10-CM | POA: Diagnosis not present

## 2014-03-05 DIAGNOSIS — Z23 Encounter for immunization: Secondary | ICD-10-CM | POA: Diagnosis not present

## 2014-03-19 ENCOUNTER — Ambulatory Visit (INDEPENDENT_AMBULATORY_CARE_PROVIDER_SITE_OTHER): Payer: Medicare Other | Admitting: Internal Medicine

## 2014-03-19 ENCOUNTER — Ambulatory Visit (INDEPENDENT_AMBULATORY_CARE_PROVIDER_SITE_OTHER)
Admission: RE | Admit: 2014-03-19 | Discharge: 2014-03-19 | Disposition: A | Discharge status: Home / Self Care | Payer: Medicare Other | Source: Ambulatory Visit | Attending: Internal Medicine | Admitting: Internal Medicine

## 2014-03-19 ENCOUNTER — Encounter: Payer: Self-pay | Admitting: Internal Medicine

## 2014-03-19 VITALS — BP 142/82 | HR 98 | Temp 97.2°F | Wt 157.1 lb

## 2014-03-19 DIAGNOSIS — I10 Essential (primary) hypertension: Secondary | ICD-10-CM

## 2014-03-19 DIAGNOSIS — G8929 Other chronic pain: Secondary | ICD-10-CM

## 2014-03-19 DIAGNOSIS — M1610 Unilateral primary osteoarthritis, unspecified hip: Secondary | ICD-10-CM | POA: Diagnosis not present

## 2014-03-19 DIAGNOSIS — R109 Unspecified abdominal pain: Secondary | ICD-10-CM

## 2014-03-19 DIAGNOSIS — E785 Hyperlipidemia, unspecified: Secondary | ICD-10-CM | POA: Diagnosis not present

## 2014-03-19 DIAGNOSIS — R1032 Left lower quadrant pain: Principal | ICD-10-CM

## 2014-03-19 DIAGNOSIS — M169 Osteoarthritis of hip, unspecified: Secondary | ICD-10-CM | POA: Diagnosis not present

## 2014-03-19 NOTE — Progress Notes (Signed)
Pre visit review using our clinic review tool, if applicable. No additional management support is needed unless otherwise documented below in the visit note. 

## 2014-03-19 NOTE — Assessment & Plan Note (Signed)
3 mo persistent intermittent, neg CT 2013 prior to onset, no hernia or other per urology July 2015; will check pelvis/hip films, cont tylenol prn but also refer Dr Smith/sport med, suspect some type of chronic recurring MSK abnormal

## 2014-03-19 NOTE — Progress Notes (Signed)
Subjective:    Patient ID: [name] [name], male    DOB: [birth year], 68 y.o.   MRN: [id number]  HPI Here to fu, still with achiness to left testicular area/groin area, discomfort persists despite becoming less active, hoping it was some kind of strain;  Overall started June when starting walking longer distances with a Fitbit;  No swelling, mass; still intermittent, moderate but not severe, worse to sit, ? Some stretching sensation/tightness to standing, walking not really make it worse;  Saw urology in July - neg exam for overt hernia, no other significant abnormal found, no specific treatment.  CT abd /pelvic dec 2013 with ? Nodular prostate, diverticulosis.  Last UA and PSA here was jan 2015 - neg.  Pt denies fever, night sweats, loss of appetite, or other constitutional symptoms.   Has lost 4 lbs purposefully. Wt Readings from Last 3 Encounters:  03/19/14 157 lb 2 oz (71.271 kg)  01/28/14 156 lb (70.761 kg)  07/11/13 161 lb (73.029 kg)  s/p RIH.  CT pelvis nov 2008 with biiliac atherosclerotic changes.  Has been tx per [facility] for lumbar pain, s/p ESI x 2, but did not seem to help.  No prior specific imaging of pelvis or left hip. Wondering if statin could be related Past Medical History  Diagnosis Date  . Old myocardial infarction 1989  . HTN (hypertension)   . HLD (hyperlipidemia)   . CAD (coronary artery disease)   . Hearing loss in right ear   . Atherosclerosis of aorta   . Inguinal hernia without mention of obstruction or gangrene, unilateral or unspecified, (not specified as recurrent)   . Chronic prostatitis   . BPH (benign prostatic hyperplasia)   . PUD (peptic ulcer disease)   . Diverticulitis   . Allergic rhinitis   . DJD (degenerative joint disease), lumbosacral   . Transient amnesia   . Ocular migraine   . Anxiety 05/01/2011   Past Surgical History  Procedure Laterality Date  . Inguinal hernia repair    . Appendectomy    . Coronary stent placement      reports that  he quit smoking about 33 years ago. He has never used smokeless tobacco. He reports that he drinks alcohol. He reports that he does not use illicit drugs. family history includes Alcohol abuse in his brother and father; Cancer in his father and another family member; Heart disease in an other family member; Hypertension in an other family member. There is no history of Colon cancer or Stomach cancer. Allergies  Allergen Reactions  . Niacin     Hot flashes  . Penicillins     As child: muscles stiffened   Current Outpatient Prescriptions on File Prior to Visit  Medication Sig Dispense Refill  . aspirin 81 MG tablet Take 81 mg by mouth daily.      Marland Kitchen atorvastatin (LIPITOR) 20 MG tablet Take 20 mg by mouth daily.      Marland Kitchen esomeprazole (NEXIUM) 40 MG capsule Take 40 mg by mouth daily.       . fluticasone (FLONASE) 50 MCG/ACT nasal spray Place 2 sprays into the nose daily as needed.       No current facility-administered medications on file prior to visit.   Review of Systems  Constitutional: Negative for unusual diaphoresis or other sweats  HENT: Negative for ringing in ear Eyes: Negative for double vision or worsening visual disturbance.  Respiratory: Negative for choking and stridor.   Gastrointestinal: Negative for vomiting or other  signifcant bowel change Genitourinary: Negative for hematuria or decreased urine volume.  Musculoskeletal: Negative for other MSK pain or swelling Skin: Negative for color change and worsening wound.  Neurological: Negative for tremors and numbness other than noted  Psychiatric/Behavioral: Negative for decreased concentration or agitation other than above       Objective:   Physical Exam BP 142/82  Pulse 98  Temp(Src) 97.2 F (36.2 C) (Oral)  Wt 157 lb 2 oz (71.271 kg)  SpO2 97% VS noted,  Constitutional: Pt appears well-developed, well-nourished.  HENT: Head: NCAT.  Right Ear: External ear normal.  Left Ear: External ear normal.  Eyes: . Pupils are  equal, round, and reactive to light. Conjunctivae and EOM are normal Neck: Normal range of motion. Neck supple.  Cardiovascular: Normal rate and regular rhythm.   Pulmonary/Chest: Effort normal and breath sounds normal.  Abd:  Soft, NT, ND, + BS Neurological: Pt is alert. Not confused , motor grossly intact Skin: Skin is warm. No rash Psychiatric: Pt behavior is normal. No agitation.     Assessment & Plan:

## 2014-03-19 NOTE — Patient Instructions (Signed)
Please continue all other medications as before, and refills have been done if requested.  Please have the pharmacy call with any other refills you may need.  Please keep your appointments with your specialists as you may have planned  Please go to the XRAY Department in the Basement (go straight as you get off the elevator) for the x-ray testing  You will be contacted regarding the referral for: Dr Tamala Julian - sport medicine  You will be contacted by phone if any changes need to be made immediately.  Otherwise, you will receive a letter about your results with an explanation, but please check with MyChart first.  Please remember to sign up for MyChart if you have not done so, as this will be important to you in the future with finding out test results, communicating by private email, and scheduling acute appointments online when needed.  Please return in 4 months, or sooner if needed

## 2014-03-22 NOTE — Assessment & Plan Note (Signed)
To cont statin, doubt related to current chronic pain

## 2014-03-22 NOTE — Assessment & Plan Note (Signed)
stable overall by history and exam, recent data reviewed with pt, and pt to continue medical treatment as before,  to f/u any worsening symptoms or concerns BP Readings from Last 3 Encounters:  03/19/14 142/82  01/28/14 120/80  07/11/13 130/80

## 2014-03-24 ENCOUNTER — Encounter: Payer: Self-pay | Admitting: Internal Medicine

## 2014-03-31 ENCOUNTER — Ambulatory Visit: Payer: Medicare Other | Admitting: Family Medicine

## 2014-04-02 DIAGNOSIS — H524 Presbyopia: Secondary | ICD-10-CM | POA: Diagnosis not present

## 2014-04-02 DIAGNOSIS — H2513 Age-related nuclear cataract, bilateral: Secondary | ICD-10-CM | POA: Diagnosis not present

## 2014-04-02 DIAGNOSIS — G43109 Migraine with aura, not intractable, without status migrainosus: Secondary | ICD-10-CM | POA: Diagnosis not present

## 2014-04-06 ENCOUNTER — Other Ambulatory Visit (INDEPENDENT_AMBULATORY_CARE_PROVIDER_SITE_OTHER): Payer: Medicare Other

## 2014-04-06 ENCOUNTER — Encounter: Payer: Self-pay | Admitting: Family Medicine

## 2014-04-06 ENCOUNTER — Ambulatory Visit (INDEPENDENT_AMBULATORY_CARE_PROVIDER_SITE_OTHER): Payer: Medicare Other | Admitting: Family Medicine

## 2014-04-06 VITALS — BP 138/84 | HR 102 | Ht 67.0 in | Wt 156.0 lb

## 2014-04-06 DIAGNOSIS — R1032 Left lower quadrant pain: Secondary | ICD-10-CM

## 2014-04-06 DIAGNOSIS — R1030 Lower abdominal pain, unspecified: Secondary | ICD-10-CM | POA: Diagnosis not present

## 2014-04-06 DIAGNOSIS — S76212A Strain of adductor muscle, fascia and tendon of left thigh, initial encounter: Secondary | ICD-10-CM

## 2014-04-06 NOTE — Progress Notes (Signed)
[name] [name], [name] [id number] Phone: [phone] Subjective:    I'm seeing this patient by the request  of:  Cathlean Cower, MD   CC: Left groin pain  WLN:[id number] [name] [name] is a 68 y.o. male coming in with complaint of left groin pain. Patient states that this started approximately 3 months ago. Patient does not remember any true injury but started walking much more consistently a regular basis. Patient states that he does have a past medical history of having a hernia repair previously on this side. Patient states that this is greater than 30 years ago. Patient states that this does not seem to feel very similar to that. Patient states this only seems to hurt more with activity. Patient denies any radiation down the leg but states that it sometimes some mild testicular discomfort. Patient states that he has not been walking and was hoping that it would improve but unfortunately is starting to hurt him with [place] regular daily activities. Patient denies any fevers or chills or any abnormal weight loss or any difficulty with urination. Patient rates the severity of 6/10. Patient just wants to make sure it is okay and then he can start doing his exercises on a more regular basis. Denies any nighttime awakening.    X-rays reviewed by me today. Patient's x-rays of his hip previously she some mild osteophytic changes but no significant bony abnormality.  Past medical history, social, surgical and family history all reviewed in electronic medical record.   Review of Systems: No headache, visual changes, nausea, vomiting, diarrhea, constipation, dizziness, abdominal pain, skin rash, fevers, chills, night sweats, weight loss, swollen lymph nodes, body aches, joint swelling, muscle aches, chest pain, shortness of breath, mood changes.   Objective Blood pressure 138/84, pulse 102, height 5\' 7"  (1.702 m), weight 156 lb (70.761 kg), SpO2 96.00%.    General: No apparent distress alert and oriented x3 mood and affect normal, dressed appropriately.  HEENT: Pupils equal, extraocular movements intact  Respiratory: Patient's speak in full sentences and does not appear short of breath  Cardiovascular: No lower extremity edema, non tender, no erythema  Skin: Warm dry intact with no signs of infection or rash on extremities or on axial skeleton.  Abdomen: Soft nontender  Neuro: Cranial nerves II through XII are intact, neurovascularly intact in all extremities with 2+ DTRs and 2+ pulses.  Lymph: No lymphadenopathy of posterior or anterior cervical chain or axillae bilaterally.  Gait normal with good balance and coordination.  MSK:  Non tender with full range of motion and good stability and symmetric strength and tone of shoulders, elbows, wrist,  knee and ankles bilaterally.  Hip: Left ROM IR: 45 Deg, ER: 45 Deg, Flexion: 120 Deg, Extension: 100 Deg, Abduction: 45 Deg, Adduction: 45 Deg Strength IR: 5/5, ER: 5/5, Flexion: 5/5, Extension: 5/5, Abduction: 4/5, Adduction: 4/5 Pelvic alignment unremarkable to inspection and palpation. Standing hip rotation and gait without trendelenburg sign / unsteadiness. Greater trochanter without tenderness to palpation. No tenderness over piriformis and greater trochanter. No pain with FABER or FADIR. No SI joint tenderness and normal minimal SI movement. Contralateral hip unremarkable  MSK US performed of: Left hip This study was ordered, performed, and interpreted by Charlann Boxer D.O.  Hip: Trochanteric bursa without swelling or effusion. Acetabular labrum visualized and without tears, displacement, or effusion in joint. Femoral neck appears unremarkable without increased power doppler signal along Cortex. It appears that patient's adductor Magnes at its  origin on the pubic symphysis does have some scar tissue formation and mild hypoechoic changes. No avulsion fracture noted. Patient's inguinal canal  does not show any signs of herniation with Valsalva.  IMPRESSION:  Healing adductor strain    Impression and Recommendations:     This case required medical decision making of moderate complexity.

## 2014-04-06 NOTE — Patient Instructions (Signed)
Good to see you.  Ice 20 minutes 2 times daily. Usually after activity and before bed. Exercises 3 times a week. In handout Walk 3 times a week  On flat surface Bike 3 times a weke  Vitamin D 2000 IU daily Turmeric 500mg  twice daily.  Try pennsaid twice daily if needed, pinky size Compression shorts with activity ([employer] or [employer] sports) Heel lift in left shoe for short term.  Come back in 3 weeks.

## 2014-04-06 NOTE — Assessment & Plan Note (Signed)
Relieve the patient's pain is secondary to an ADDuctor strain. Differential also includes an inguinal hernia but it does not appear so. Patient does have a past medical history of irritable bowel syndrome as well as chronic prostatitis. We need to keep these in the differential is causing some referred pain. We discussed different treatment options. Patient will try some topical anti-inflammatories, compression sleeve, exercises, and icing protocol. Patient and will come back and see me again in 3 weeks. Continuing to have pain we can consider further imaging or formal physical therapy. I am optimistic that patient will improve.

## 2014-04-27 ENCOUNTER — Ambulatory Visit: Payer: Medicare Other | Admitting: Family Medicine

## 2014-04-28 ENCOUNTER — Encounter: Payer: Self-pay | Admitting: Family Medicine

## 2014-04-28 ENCOUNTER — Ambulatory Visit (INDEPENDENT_AMBULATORY_CARE_PROVIDER_SITE_OTHER): Payer: Medicare Other | Admitting: Family Medicine

## 2014-04-28 VITALS — BP 132/84 | HR 87 | Ht 67.5 in | Wt 157.0 lb

## 2014-04-28 DIAGNOSIS — S76212A Strain of adductor muscle, fascia and tendon of left thigh, initial encounter: Secondary | ICD-10-CM

## 2014-04-28 NOTE — Assessment & Plan Note (Signed)
I still believe the patient has more of an adductor strain. Differential does include the chronic prostatitis and we may want to consider treatment if patient does not make any significant improvement. I do not have any findings of a hernia at this time. And I do not think that further imaging is necessary. Did discuss with him that I feel that physical therapy could be beneficial but patient has declined. Patient told about more of a piriformis syndrome and that radicular symptoms from the back could also be contributing and if we do not make any significant improvement I would like to get an x-ray of his back. Patient has been somewhat noncompliant which makes it difficult for patient to heal. We will see how patient does with the current treatment and see if he makes any improvement. Another possibility will be an injection in the hip to rule out any intra-articular pathology such as a abnormal labral tear that is not appreciated on x-ray. Patient's x-ray of the haphazardly mild osteophytic changes. No signs of impingement noted. We will continue to monitor and see patient regular intervals. Encourage icing, compression, and the home exercises again regularly.  Spent greater than 25 minutes with patient face-to-face and had greater than 50% of counseling including as described above in assessment and plan.

## 2014-04-28 NOTE — Patient Instructions (Signed)
Good to se eyou Still do icing after activity Try a tennisball in back left pocket with sitting.  Conitnue the exercises and we will try physical therapy Come back in 3 weeks. In 3 weeks we can talk about imaging or an injection in your hip.

## 2014-04-28 NOTE — Progress Notes (Signed)
  [name] [address] [place], [place] [address] Phone: [phone] Subjective:    I'm seeing this patient by the request  of:  Cathlean Cower, MD   CC: Left groin pain  [id number] [name] [name] is a 68 y.o. male coming in with complaint of left groin pain. Patient was diagnosed with more than adductor strain. Patient was given home exercises, icing protocol, and we discussed compression shorts. Patient states that he has not been very compliant. Patient states that he has not been doing the exercises regularly. States that he did buy compression shorts but has not been wearing them. Patient has not been taking the medications as prescribed either. Patient states that he has days that are good and days that are bad. Patient states that if he walks long distances seems to do fairly well but then with sitting he has more discomfort. Patient states that it seems to still be in more the groin area and can radiate to the left testicle. Denies any fevers or chills. Patient denies any pain with urination. Patient has had a history of chronic prostatitis but denies any of those similar symptoms. Patient states that from time to time with him sitting or laying down he can have a dull aching sensation as well. Denies any weakness or any radiation down the leg.    X-rays reviewed by me today. Patient's x-rays of his hip previously she some mild osteophytic changes but no significant bony abnormality.  Past medical history, social, surgical and family history all reviewed in electronic medical record.   Review of Systems: No headache, visual changes, nausea, vomiting, diarrhea, constipation, dizziness, abdominal pain, skin rash, fevers, chills, night sweats, weight loss, swollen lymph nodes, body aches, joint swelling, muscle aches, chest pain, shortness of breath, mood changes.   Objective Blood pressure 132/84, height 5' 7.5" (1.715 m), weight 157 lb (71.215 kg).  General:  No apparent distress alert and oriented x3 mood and affect normal, dressed appropriately.  HEENT: Pupils equal, extraocular movements intact  Respiratory: Patient's speak in full sentences and does not appear short of breath  Cardiovascular: No lower extremity edema, non tender, no erythema  Skin: Warm dry intact with no signs of infection or rash on extremities or on axial skeleton.  Abdomen: Soft nontender  Neuro: Cranial nerves II through XII are intact, neurovascularly intact in all extremities with 2+ DTRs and 2+ pulses.  Lymph: No lymphadenopathy of posterior or anterior cervical chain or axillae bilaterally.  Gait normal with good balance and coordination.  MSK:  Non tender with full range of motion and good stability and symmetric strength and tone of shoulders, elbows, wrist,  knee and ankles bilaterally.  Hip: Left ROM IR: 25 Deg, ER: 45 Deg, Flexion: 120 Deg, Extension: 100 Deg, Abduction: 45 Deg, Adduction: 45 Deg Strength IR: 5/5, ER: 5/5, Flexion: 5/5, Extension: 5/5, Abduction: 4/5, Adduction: 4/5 Pelvic alignment unremarkable to inspection and palpation. Standing hip rotation and gait without trendelenburg sign / unsteadiness. Greater trochanter without tenderness to palpation. No tenderness over piriformis and greater trochanter. Mild pain with Fadir No SI joint tenderness and normal minimal SI movement. Contralateral hip unremarkable Continue discomfort      Impression and Recommendations:     This case required medical decision making of moderate complexity.

## 2014-04-30 DIAGNOSIS — L570 Actinic keratosis: Secondary | ICD-10-CM | POA: Diagnosis not present

## 2014-04-30 DIAGNOSIS — C44519 Basal cell carcinoma of skin of other part of trunk: Secondary | ICD-10-CM | POA: Diagnosis not present

## 2014-04-30 DIAGNOSIS — Z08 Encounter for follow-up examination after completed treatment for malignant neoplasm: Secondary | ICD-10-CM | POA: Diagnosis not present

## 2014-04-30 DIAGNOSIS — L03011 Cellulitis of right finger: Secondary | ICD-10-CM | POA: Diagnosis not present

## 2014-04-30 DIAGNOSIS — L578 Other skin changes due to chronic exposure to nonionizing radiation: Secondary | ICD-10-CM | POA: Diagnosis not present

## 2014-04-30 DIAGNOSIS — Z85828 Personal history of other malignant neoplasm of skin: Secondary | ICD-10-CM | POA: Diagnosis not present

## 2014-05-15 DIAGNOSIS — S76212D Strain of adductor muscle, fascia and tendon of left thigh, subsequent encounter: Secondary | ICD-10-CM | POA: Diagnosis not present

## 2014-05-15 DIAGNOSIS — M25652 Stiffness of left hip, not elsewhere classified: Secondary | ICD-10-CM | POA: Diagnosis not present

## 2014-05-15 DIAGNOSIS — M79605 Pain in left leg: Secondary | ICD-10-CM | POA: Diagnosis not present

## 2014-05-18 DIAGNOSIS — M79605 Pain in left leg: Secondary | ICD-10-CM | POA: Diagnosis not present

## 2014-05-18 DIAGNOSIS — M25652 Stiffness of left hip, not elsewhere classified: Secondary | ICD-10-CM | POA: Diagnosis not present

## 2014-05-18 DIAGNOSIS — S76212D Strain of adductor muscle, fascia and tendon of left thigh, subsequent encounter: Secondary | ICD-10-CM | POA: Diagnosis not present

## 2014-05-19 ENCOUNTER — Ambulatory Visit: Payer: Medicare Other | Admitting: Family Medicine

## 2014-05-20 ENCOUNTER — Encounter: Payer: Self-pay | Admitting: Family Medicine

## 2014-05-20 ENCOUNTER — Ambulatory Visit (INDEPENDENT_AMBULATORY_CARE_PROVIDER_SITE_OTHER): Payer: Medicare Other | Admitting: Family Medicine

## 2014-05-20 VITALS — BP 122/76 | HR 87 | Ht 67.0 in | Wt 159.0 lb

## 2014-05-20 DIAGNOSIS — S76212D Strain of adductor muscle, fascia and tendon of left thigh, subsequent encounter: Secondary | ICD-10-CM

## 2014-05-20 NOTE — Progress Notes (Signed)
  [name] [address] [place], [place] [address] Phone: [phone] Subjective:    CC: Left groin pain, f/u   [id number] [name] [name] is a 68 y.o. male coming in with complaint of left groin pain. Patient was diagnosed with more than adductor strain. Patient was given home exercises, icing protocol, and we discussed compression shorts. Patient states that he is doing better especially now that he is starting with formal physical therapy. Patient denies any new symptoms. Patient states that he is not having any difficult with activities of daily living. Denies any nighttime awakening. Denies any fevers chills or any abnormal weight loss. Overall patient didn't see has improved proximally 50%.      Past medical history, social, surgical and family history all reviewed in electronic medical record.   Review of Systems: No headache, visual changes, nausea, vomiting, diarrhea, constipation, dizziness, abdominal pain, skin rash, fevers, chills, night sweats, weight loss, swollen lymph nodes, body aches, joint swelling, muscle aches, chest pain, shortness of breath, mood changes.   Objective Blood pressure 122/76, pulse 87, height 5\' 7"  (1.702 m), weight 159 lb (72.122 kg), SpO2 97 %.  General: No apparent distress alert and oriented x3 mood and affect normal, dressed appropriately.  HEENT: Pupils equal, extraocular movements intact  Respiratory: Patient's speak in full sentences and does not appear short of breath  Cardiovascular: No lower extremity edema, non tender, no erythema  Skin: Warm dry intact with no signs of infection or rash on extremities or on axial skeleton.  Abdomen: Soft nontender  Neuro: Cranial nerves II through XII are intact, neurovascularly intact in all extremities with 2+ DTRs and 2+ pulses.  Lymph: No lymphadenopathy of posterior or anterior cervical chain or axillae bilaterally.  Gait normal with good balance and coordination.  MSK:   Non tender with full range of motion and good stability and symmetric strength and tone of shoulders, elbows, wrist,  knee and ankles bilaterally.  Hip: Left ROM IR: 25 Deg, ER: 45 Deg, Flexion: 120 Deg, Extension: 100 Deg, Abduction: 45 Deg, Adduction: 45 Deg Strength IR: 5/5, ER: 5/5, Flexion: 5/5, Extension: 5/5, Abduction: 4/5, Adduction: 4/5 Pelvic alignment unremarkable to inspection and palpation. Standing hip rotation and gait without trendelenburg sign / unsteadiness. Greater trochanter without tenderness to palpation. No tenderness over piriformis and greater trochanter.  No pain with FADIR No SI joint tenderness and normal minimal SI movement. Contralateral hip unremarkable Improved from previous exam.;       Impression and Recommendations:     This case required medical decision making of moderate complexity.

## 2014-05-20 NOTE — Patient Instructions (Signed)
You are doing great!!! Ice is still your friend when some discomfort.  Heat ok as well.  Continue the exercises  Have a great family re-uninion Continue with physical therapy See me again in 4-6 weeks.

## 2014-05-20 NOTE — Assessment & Plan Note (Signed)
Patient is doing better at this time. Encourage patient to finish with formal physical therapy. We also discussed continuing the other home exercises as well as the compression sleeve with activity. We discussed the importance is fixing the muscle imbalances to prevent any other type of injury. Encourage patient to continue some of the natural vitamins we've discussed previously. Patient and will follow-up again in 4-6 weeks for further evaluation.  Spent greater than 25 minutes with patient face-to-face and had greater than 50% of counseling including as described above in assessment and plan.

## 2014-05-21 DIAGNOSIS — M79605 Pain in left leg: Secondary | ICD-10-CM | POA: Diagnosis not present

## 2014-05-21 DIAGNOSIS — M25652 Stiffness of left hip, not elsewhere classified: Secondary | ICD-10-CM | POA: Diagnosis not present

## 2014-05-21 DIAGNOSIS — S76212D Strain of adductor muscle, fascia and tendon of left thigh, subsequent encounter: Secondary | ICD-10-CM | POA: Diagnosis not present

## 2014-06-01 DIAGNOSIS — M79605 Pain in left leg: Secondary | ICD-10-CM | POA: Diagnosis not present

## 2014-06-01 DIAGNOSIS — S76212D Strain of adductor muscle, fascia and tendon of left thigh, subsequent encounter: Secondary | ICD-10-CM | POA: Diagnosis not present

## 2014-06-01 DIAGNOSIS — M25652 Stiffness of left hip, not elsewhere classified: Secondary | ICD-10-CM | POA: Diagnosis not present

## 2014-06-03 DIAGNOSIS — M25652 Stiffness of left hip, not elsewhere classified: Secondary | ICD-10-CM | POA: Diagnosis not present

## 2014-06-03 DIAGNOSIS — S76212D Strain of adductor muscle, fascia and tendon of left thigh, subsequent encounter: Secondary | ICD-10-CM | POA: Diagnosis not present

## 2014-06-03 DIAGNOSIS — M79605 Pain in left leg: Secondary | ICD-10-CM | POA: Diagnosis not present

## 2014-06-08 DIAGNOSIS — S76212D Strain of adductor muscle, fascia and tendon of left thigh, subsequent encounter: Secondary | ICD-10-CM | POA: Diagnosis not present

## 2014-06-08 DIAGNOSIS — M25652 Stiffness of left hip, not elsewhere classified: Secondary | ICD-10-CM | POA: Diagnosis not present

## 2014-06-08 DIAGNOSIS — M79605 Pain in left leg: Secondary | ICD-10-CM | POA: Diagnosis not present

## 2014-06-10 DIAGNOSIS — S76212D Strain of adductor muscle, fascia and tendon of left thigh, subsequent encounter: Secondary | ICD-10-CM | POA: Diagnosis not present

## 2014-06-10 DIAGNOSIS — M25652 Stiffness of left hip, not elsewhere classified: Secondary | ICD-10-CM | POA: Diagnosis not present

## 2014-06-10 DIAGNOSIS — M79605 Pain in left leg: Secondary | ICD-10-CM | POA: Diagnosis not present

## 2014-06-15 DIAGNOSIS — S76212D Strain of adductor muscle, fascia and tendon of left thigh, subsequent encounter: Secondary | ICD-10-CM | POA: Diagnosis not present

## 2014-06-15 DIAGNOSIS — M79605 Pain in left leg: Secondary | ICD-10-CM | POA: Diagnosis not present

## 2014-06-15 DIAGNOSIS — M25652 Stiffness of left hip, not elsewhere classified: Secondary | ICD-10-CM | POA: Diagnosis not present

## 2014-06-17 DIAGNOSIS — S76212D Strain of adductor muscle, fascia and tendon of left thigh, subsequent encounter: Secondary | ICD-10-CM | POA: Diagnosis not present

## 2014-06-17 DIAGNOSIS — M25652 Stiffness of left hip, not elsewhere classified: Secondary | ICD-10-CM | POA: Diagnosis not present

## 2014-06-17 DIAGNOSIS — M79605 Pain in left leg: Secondary | ICD-10-CM | POA: Diagnosis not present

## 2014-07-06 ENCOUNTER — Encounter: Payer: Self-pay | Admitting: Family Medicine

## 2014-07-06 ENCOUNTER — Ambulatory Visit (INDEPENDENT_AMBULATORY_CARE_PROVIDER_SITE_OTHER): Payer: Medicare Other | Admitting: Family Medicine

## 2014-07-06 VITALS — BP 138/78 | HR 88 | Ht 67.0 in | Wt 162.0 lb

## 2014-07-06 DIAGNOSIS — S76212D Strain of adductor muscle, fascia and tendon of left thigh, subsequent encounter: Secondary | ICD-10-CM | POA: Diagnosis not present

## 2014-07-06 NOTE — Progress Notes (Signed)
  [name] [address] [place], [place] [address] Phone: [phone] Subjective:    CC: Left groin pain, f/u   [id number] [name] is a 69 y.o. male coming in with complaint of left groin pain. Patient was diagnosed with more than adductor strain. Patient has been going to formal physical therapy and states that he is 95% better. Patient denies any radiation down leg or any numbness or tingling. Denies any bowel or bladder incontinence. Patient is very happy. Patient has been doing regular daily activities and resting comfortably.  Patient has had some mild abdominal wall pain. Patient has had this intermittently for the last several weeks. Noticed it after he was raking. Is getting better over time. Patient has had a history of inguinal hernia previously but states that this feels very different. Worse with movement and better with rest. Denies any changes in bowel habits.      Past medical history, social, surgical and family history all reviewed in electronic medical record.   Review of Systems: No headache, visual changes, nausea, vomiting, diarrhea, constipation, dizziness, abdominal pain, skin rash, fevers, chills, night sweats, weight loss, swollen lymph nodes, body aches, joint swelling, muscle aches, chest pain, shortness of breath, mood changes.   Objective Blood pressure 138/78, pulse 88, height 5\' 7"  (1.702 m), weight 162 lb (73.483 kg), SpO2 97 %.  General: No apparent distress alert and oriented x3 mood and affect normal, dressed appropriately.  HEENT: Pupils equal, extraocular movements intact  Respiratory: Patient's speak in full sentences and does not appear short of breath  Cardiovascular: No lower extremity edema, non tender, no erythema  Skin: Warm dry intact with no signs of infection or rash on extremities or on axial skeleton.  Abdomen: Soft nontender  Neuro: Cranial nerves II through XII are intact, neurovascularly intact in  all extremities with 2+ DTRs and 2+ pulses.  Lymph: No lymphadenopathy of posterior or anterior cervical chain or axillae bilaterally.  Gait normal with good balance and coordination.  MSK:  Non tender with full range of motion and good stability and symmetric strength and tone of shoulders, elbows, wrist,  knee and ankles bilaterally.  Hip: Left ROM IR: 25 Deg, ER: 45 Deg, Flexion: 120 Deg, Extension: 100 Deg, Abduction: 45 Deg, Adduction: 45 Deg Strength IR: 5/5, ER: 5/5, Flexion: 5/5, Extension: 5/5, Abduction: 4/5, Adduction: 4/5 Pelvic alignment unremarkable to inspection and palpation. Standing hip rotation and gait without trendelenburg sign / unsteadiness. Greater trochanter without tenderness to palpation. No tenderness over piriformis and greater trochanter.  No pain with FADIR No SI joint tenderness and normal minimal SI movement. Contralateral hip unremarkable Unremarkable on exam.      Impression and Recommendations:     This case required medical decision making of moderate complexity.

## 2014-07-06 NOTE — Patient Instructions (Signed)
Good to see you You are doing great!!! For your stomach, get someone to do your lawn ;) OK to start walking and do the exercises You decide on the PT See me again in 3 weeks if not better.

## 2014-07-06 NOTE — Assessment & Plan Note (Signed)
Patient is doing significantly better. Patient does have 2 more visits of formal physical therapy and encourage patient to do this if he feels that is helpful. Encourage him to start doing his regular daily activities. Patient will try to make these changes and come back in 3 weeks if abdominal pain continues. Otherwise patient can follow-up on an as-needed basis.

## 2014-08-28 DIAGNOSIS — I2510 Atherosclerotic heart disease of native coronary artery without angina pectoris: Secondary | ICD-10-CM | POA: Diagnosis not present

## 2014-09-16 ENCOUNTER — Other Ambulatory Visit (INDEPENDENT_AMBULATORY_CARE_PROVIDER_SITE_OTHER): Payer: Medicare Other

## 2014-09-16 ENCOUNTER — Ambulatory Visit (INDEPENDENT_AMBULATORY_CARE_PROVIDER_SITE_OTHER): Payer: Medicare Other | Admitting: Internal Medicine

## 2014-09-16 ENCOUNTER — Encounter: Payer: Self-pay | Admitting: Internal Medicine

## 2014-09-16 VITALS — BP 122/80 | HR 82 | Temp 98.7°F | Resp 18 | Ht 67.0 in | Wt 154.0 lb

## 2014-09-16 DIAGNOSIS — E785 Hyperlipidemia, unspecified: Secondary | ICD-10-CM | POA: Diagnosis not present

## 2014-09-16 DIAGNOSIS — N320 Bladder-neck obstruction: Secondary | ICD-10-CM

## 2014-09-16 DIAGNOSIS — I10 Essential (primary) hypertension: Secondary | ICD-10-CM

## 2014-09-16 LAB — URINALYSIS, ROUTINE W REFLEX MICROSCOPIC
BILIRUBIN URINE: NEGATIVE
HGB URINE DIPSTICK: NEGATIVE
KETONES UR: NEGATIVE
LEUKOCYTES UA: NEGATIVE
Nitrite: NEGATIVE
SPECIFIC GRAVITY, URINE: 1.015 (ref 1.000–1.030)
Total Protein, Urine: NEGATIVE
URINE GLUCOSE: NEGATIVE
Urobilinogen, UA: 1 (ref 0.0–1.0)
pH: 6.5 (ref 5.0–8.0)

## 2014-09-16 LAB — BASIC METABOLIC PANEL
BUN: 15 mg/dL (ref 6–23)
CO2: 32 mEq/L (ref 19–32)
CREATININE: 1.04 mg/dL (ref 0.40–1.50)
Calcium: 8.9 mg/dL (ref 8.4–10.5)
Chloride: 104 mEq/L (ref 96–112)
GFR: 74.93 mL/min (ref >60.00)
Glucose, Bld: 102 mg/dL — ABNORMAL HIGH (ref 70–99)
POTASSIUM: 3.9 meq/L (ref 3.5–5.1)
SODIUM: 141 meq/L (ref 135–145)

## 2014-09-16 LAB — CBC WITH DIFFERENTIAL/PLATELET
Basophils Absolute: 0 10*3/uL (ref 0.0–0.1)
Basophils Relative: 0.5 % (ref 0.0–3.0)
EOS PCT: 1.2 % (ref 0.0–5.0)
Eosinophils Absolute: 0.1 10*3/uL (ref 0.0–0.7)
HCT: 39.9 % (ref 39.0–52.0)
HEMOGLOBIN: 13.8 g/dL (ref 13.0–17.0)
Lymphocytes Relative: 30.7 % (ref 12.0–46.0)
Lymphs Abs: 1.6 10*3/uL (ref 0.7–4.0)
MCHC: 34.6 g/dL (ref 30.0–36.0)
MCV: 92.5 fl (ref 78.0–100.0)
MONOS PCT: 10.9 % (ref 3.0–12.0)
Monocytes Absolute: 0.6 10*3/uL (ref 0.1–1.0)
NEUTROS PCT: 56.7 % (ref 43.0–77.0)
Neutro Abs: 3 10*3/uL (ref 1.4–7.7)
Platelets: 182 10*3/uL (ref 150.0–400.0)
RBC: 4.32 Mil/uL (ref 4.22–5.81)
RDW: 12.7 % (ref 11.5–15.5)
WBC: 5.3 10*3/uL (ref 4.0–10.5)

## 2014-09-16 LAB — HEPATIC FUNCTION PANEL
ALT: 23 U/L (ref 0–53)
AST: 24 U/L (ref 0–37)
Albumin: 4.1 g/dL (ref 3.5–5.2)
Alkaline Phosphatase: 60 U/L (ref 39–117)
BILIRUBIN DIRECT: 0.1 mg/dL (ref 0.0–0.3)
Total Bilirubin: 0.6 mg/dL (ref 0.2–1.2)
Total Protein: 6.5 g/dL (ref 6.0–8.3)

## 2014-09-16 LAB — LIPID PANEL
CHOL/HDL RATIO: 4
Cholesterol: 140 mg/dL (ref 0–200)
HDL: 38.8 mg/dL — ABNORMAL LOW (ref >39.00)
LDL Cholesterol: 75 mg/dL (ref 0–99)
NONHDL: 101.2
Triglycerides: 131 mg/dL (ref 0.0–149.0)
VLDL: 26.2 mg/dL (ref 0.0–40.0)

## 2014-09-16 LAB — PSA: PSA: 1.05 ng/mL (ref 0.10–4.00)

## 2014-09-16 LAB — TSH: TSH: 1.78 u[IU]/mL (ref 0.35–4.50)

## 2014-09-16 NOTE — Progress Notes (Signed)
Subjective:    Patient ID: [name] [name], male    DOB: [birth year], 69 y.o.   MRN: [id number]  HPI  Here for yearly f/u;  Overall doing ok;  Pt denies Chest pain, worsening SOB, DOE, wheezing, orthopnea, PND, worsening LE edema, palpitations, dizziness or syncope.  Pt denies neurological change such as new headache, facial or extremity weakness.  Pt denies polydipsia, polyuria, or low sugar symptoms. Pt states overall good compliance with treatment and medications, good tolerability, and has been trying to follow appropriate diet.  Pt denies worsening depressive symptoms, suicidal ideation or panic. No fever, night sweats, wt loss, loss of appetite, or other constitutional symptoms.  Pt states good ability with ADL's, has low fall risk, home safety reviewed and adequate, no other significant changes in hearing or vision, and only occasionally active with exercise. Used to run until 69yo, none since, no Walks 3 miles treadmoill5-6 days per wk.  Adducotr strain has resolved  Denies urinary symptoms such as dysuria, frequency, urgency, flank pain, hematuria or n/v, fever, chills. Past Medical History  Diagnosis Date  . Old myocardial infarction 1989  . HTN (hypertension)   . HLD (hyperlipidemia)   . CAD (coronary artery disease)   . Hearing loss in right ear   . Atherosclerosis of aorta   . Inguinal hernia without mention of obstruction or gangrene, unilateral or unspecified, (not specified as recurrent)   . Chronic prostatitis   . BPH (benign prostatic hyperplasia)   . PUD (peptic ulcer disease)   . Diverticulitis   . Allergic rhinitis   . DJD (degenerative joint disease), lumbosacral   . Transient amnesia   . Ocular migraine   . Anxiety 05/01/2011   Past Surgical History  Procedure Laterality Date  . Inguinal hernia repair    . Appendectomy    . Coronary stent placement      reports that he quit smoking about 34 years ago. He has never used smokeless tobacco. He reports that he drinks  alcohol. He reports that he does not use illicit drugs. family history includes Alcohol abuse in his brother and father; Cancer in his father and another family member; Heart disease in an other family member; Hypertension in an other family member. There is no history of Colon cancer or Stomach cancer. Allergies  Allergen Reactions  . Niacin     Hot flashes  . Penicillins     As child: muscles stiffened   Current Outpatient Prescriptions on File Prior to Visit  Medication Sig Dispense Refill  . aspirin 81 MG tablet Take 81 mg by mouth daily.    Marland Kitchen atorvastatin (LIPITOR) 20 MG tablet Take 20 mg by mouth daily.    Marland Kitchen esomeprazole (NEXIUM) 40 MG capsule Take 40 mg by mouth daily.     . fluticasone (FLONASE) 50 MCG/ACT nasal spray Place 2 sprays into the nose daily as needed.     No current facility-administered medications on file prior to visit.    Review of Systems Constitutional: Negative for increased diaphoresis, other activity, appetite or siginficant weight change other than noted HENT: Negative for worsening hearing loss, ear pain, facial swelling, mouth sores and neck stiffness.   Eyes: Negative for other worsening pain, redness or visual disturbance.  Respiratory: Negative for shortness of breath and wheezing  Cardiovascular: Negative for chest pain and palpitations.  Gastrointestinal: Negative for diarrhea, blood in stool, abdominal distention or other pain Genitourinary: Negative for hematuria, flank pain or change in urine volume.  Musculoskeletal: Negative  for myalgias or other joint complaints.  Skin: Negative for color change and wound or drainage.  Neurological: Negative for syncope and numbness. other than noted Hematological: Negative for adenopathy. or other swelling Psychiatric/Behavioral: Negative for hallucinations, SI, self-injury, decreased concentration or other worsening agitation.      Objective:   Physical Exam BP 122/80 mmHg  Pulse 82  Temp(Src) 98.7 F  (37.1 C) (Oral)  Resp 18  Ht 5\' 7"  (1.702 m)  Wt 154 lb (69.854 kg)  BMI 24.11 kg/m2  SpO2 95% VS noted,  Constitutional: Pt is oriented to person, place, and time. Appears well-developed and well-nourished, in no significant distress Head: Normocephalic and atraumatic.  Right Ear: External ear normal.  Left Ear: External ear normal.  Nose: Nose normal.  Mouth/Throat: Oropharynx is clear and moist.  Eyes: Conjunctivae and EOM are normal. Pupils are equal, round, and reactive to light.  Neck: Normal range of motion. Neck supple. No JVD present. No tracheal deviation present or significant neck LA or mass Cardiovascular: Normal rate, regular rhythm, normal heart sounds and intact distal pulses.   Pulmonary/Chest: Effort normal and breath sounds without rales or wheezing  Abdominal: Soft. Bowel sounds are normal. NT. No HSM  Musculoskeletal: Normal range of motion. Exhibits no edema.  Lymphadenopathy:  Has no cervical adenopathy.  Neurological: Pt is alert and oriented to person, place, and time. Pt has normal reflexes. No cranial nerve deficit. Motor grossly intact Skin: Skin is warm and dry. No rash noted.  Psychiatric:  Has normal mood and affect. Behavior is normal.     Assessment & Plan:

## 2014-09-16 NOTE — Assessment & Plan Note (Signed)
stable overall by history and exam, recent data reviewed with pt, and pt to continue medical treatment as before,  to f/u any worsening symptoms or concerns BP Readings from Last 3 Encounters:  09/16/14 122/80  07/06/14 138/78  05/20/14 122/76

## 2014-09-16 NOTE — Patient Instructions (Signed)

## 2014-09-16 NOTE — Progress Notes (Signed)
Pre visit review using our clinic review tool, if applicable. No additional management support is needed unless otherwise documented below in the visit note. 

## 2014-09-16 NOTE — Assessment & Plan Note (Signed)
Asympt, for psa as he is due 

## 2014-09-16 NOTE — Assessment & Plan Note (Signed)
stable overall by history and exam, recent data reviewed with pt, and pt to continue medical treatment as before,  to f/u any worsening symptoms or concerns Lab Results  Component Value Date   LDLCALC 77 07/11/2013

## 2014-09-30 ENCOUNTER — Encounter: Payer: Self-pay | Admitting: Internal Medicine

## 2014-09-30 ENCOUNTER — Encounter: Payer: Self-pay | Admitting: Family Medicine

## 2014-11-12 DIAGNOSIS — L821 Other seborrheic keratosis: Secondary | ICD-10-CM | POA: Diagnosis not present

## 2014-11-12 DIAGNOSIS — L570 Actinic keratosis: Secondary | ICD-10-CM | POA: Diagnosis not present

## 2014-11-12 DIAGNOSIS — Z08 Encounter for follow-up examination after completed treatment for malignant neoplasm: Secondary | ICD-10-CM | POA: Diagnosis not present

## 2014-11-12 DIAGNOSIS — D485 Neoplasm of uncertain behavior of skin: Secondary | ICD-10-CM | POA: Diagnosis not present

## 2014-11-12 DIAGNOSIS — L578 Other skin changes due to chronic exposure to nonionizing radiation: Secondary | ICD-10-CM | POA: Diagnosis not present

## 2014-11-12 DIAGNOSIS — Z85828 Personal history of other malignant neoplasm of skin: Secondary | ICD-10-CM | POA: Diagnosis not present

## 2014-11-12 DIAGNOSIS — C44319 Basal cell carcinoma of skin of other parts of face: Secondary | ICD-10-CM | POA: Diagnosis not present

## 2014-11-26 DIAGNOSIS — C44310 Basal cell carcinoma of skin of unspecified parts of face: Secondary | ICD-10-CM | POA: Diagnosis not present

## 2014-12-19 DIAGNOSIS — Z6824 Body mass index (BMI) 24.0-24.9, adult: Secondary | ICD-10-CM | POA: Diagnosis not present

## 2014-12-19 DIAGNOSIS — J40 Bronchitis, not specified as acute or chronic: Secondary | ICD-10-CM | POA: Diagnosis not present

## 2015-01-29 ENCOUNTER — Encounter: Payer: Self-pay | Admitting: Gastroenterology

## 2015-02-01 DIAGNOSIS — J3489 Other specified disorders of nose and nasal sinuses: Secondary | ICD-10-CM | POA: Diagnosis not present

## 2015-02-01 DIAGNOSIS — Z6824 Body mass index (BMI) 24.0-24.9, adult: Secondary | ICD-10-CM | POA: Diagnosis not present

## 2015-02-16 DIAGNOSIS — L259 Unspecified contact dermatitis, unspecified cause: Secondary | ICD-10-CM | POA: Diagnosis not present

## 2015-03-09 DIAGNOSIS — J0191 Acute recurrent sinusitis, unspecified: Secondary | ICD-10-CM | POA: Diagnosis not present

## 2015-03-11 DIAGNOSIS — Z23 Encounter for immunization: Secondary | ICD-10-CM | POA: Diagnosis not present

## 2015-04-26 ENCOUNTER — Telehealth: Payer: Self-pay

## 2015-04-26 NOTE — Telephone Encounter (Signed)
Patient called to educate on Medicare Wellness apt and agreed to come in this Friday at on this 28th at [place]

## 2015-04-30 ENCOUNTER — Ambulatory Visit (INDEPENDENT_AMBULATORY_CARE_PROVIDER_SITE_OTHER): Payer: Medicare Other

## 2015-04-30 VITALS — BP 126/80 | Ht 68.0 in | Wt 158.8 lb

## 2015-04-30 DIAGNOSIS — Z0000 Encounter for general adult medical examination without abnormal findings: Secondary | ICD-10-CM

## 2015-04-30 NOTE — Progress Notes (Addendum)
Subjective:   [name] [name] is a 69 y.o. male who presents for Medicare Annual/Subsequent preventive examination.  Review of Systems:  HRA assessment completed during visit; [name] [name] The Patient was informed that this wellness visit is to identify risk and educate on how to reduce risk for increase disease through lifestyle changes.   ROS not completed at [place] issues  CAD and MI at 34; 69 yo (1989) ordered stress tes 2011 with stent for 85% blockage HTN; Lipids HDL 38(historically below 50); LDL 75; chol 140; trig 131; (09/2014)  A1c 5.7 (2010)  Frozen right shoulder;  Neck disc issues; lower back; has been to [facility] Had chronic back pain; sometimes can get to a 5 but he sits or rest  Compensates daily;  Lower back pain in left buttocks; 5 on some days in late evening;   Stress: lots of things he can't control. Wife had cancer x 57 yo; Dtr had breast cancer as well   BMI: 24;  Diet; breakfast wheat bran flakes; light grape juice; lunch; sandwich; luncheon meat; English muffin; Fruit; dinner- wife cooks low fat; cookies on occasion ETOH daily with dinner  Probably more deficient in vegetables   Exercise; 5 to 6 times a week 30 min 3.6 miles per hour/ 150 min a week, but did show me his fit bit reading which showed 11000 steps/ > 5 miles, recognizing he stays busy; Plans his work out to avoid fatigue Minimal stretching exercises for back; Light weights    SAFETY; live in present home x 6 years; downsize home; 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; One level living;  Safety reviewed for the home; clear paths through the home, eliminating clutter, railing as needed; bathroom safety; (grab bar in master bathroom tub and shower) community safety; smoke detectors   Firearms safety as well as sun protection;  Driving accidents (no) and seatbelt (yes)  Sun protection; wears sunscreen at [employer] ; wears a hat   Medication review/ Meds in [place]; Added otc meds to current list (gabapentin 100 at  hs prn; Nexium is prn; melatonin 10 at hs prn; robaxin 500 if really hurting; ) Nasal spray takes 2 weeks;   Fall assessment  And no risk identified Mobilization and Functional losses in the last year.none  Urinary frequency at hs; states he has to be patient   Counseling: Hep C; declined Colonoscopy; 07/2012; repeat in 10 years; 2024;  EKG: [facility] ; [name] [name]; [phone] Hearing: hearing aids; [facility]; [facility];  Immunizations Due  Flu: [facility]; Sept 2016/ will document   Current Care Team reviewed and updated Urologist; Dr. Kathie Rhodes [phone] Othopedic; Dr. Deneen Harts [phone]  Cardiac Risk Factors include: advanced age (>70men, >79 women);male gender;family history of premature cardiovascular disease     Objective:    Vitals: BP 126/80 mmHg  Ht 5\' 8"  (1.727 m)  Wt 158 lb 12 oz (72.009 kg)  BMI 24.14 kg/m2  Tobacco History  Smoking status  . Former Smoker  . Quit date: 07/03/1980  Smokeless tobacco  . Never Used     Counseling given: Not Answered   Past Medical History  Diagnosis Date  . Old myocardial infarction 1989  . HTN (hypertension)   . HLD (hyperlipidemia)   . CAD (coronary artery disease)   . Hearing loss in right ear   . Atherosclerosis of aorta ([place])   . Inguinal hernia without mention of obstruction or gangrene, unilateral or unspecified, (not specified as recurrent)   .  Chronic prostatitis   . BPH (benign prostatic hyperplasia)   . PUD (peptic ulcer disease)   . Diverticulitis   . Allergic rhinitis   . DJD (degenerative joint disease), lumbosacral   . Transient amnesia   . Ocular migraine   . Anxiety 05/01/2011   Past Surgical History  Procedure Laterality Date  . Inguinal hernia repair    . Appendectomy    . Coronary stent placement     Family History  Problem Relation Age of Onset  . Alcohol abuse Father   . Cancer Father   . Alcohol abuse Brother   . Cancer       grandfather  . Hypertension      grandfather  . Heart disease      grandfather  . Colon cancer Neg Hx   . Stomach cancer Neg Hx    History  Sexual Activity  . Sexual Activity: Not on file    Outpatient Encounter Prescriptions as of 04/30/2015  Medication Sig  . aspirin 81 MG tablet Take 81 mg by mouth daily.  Marland Kitchen atorvastatin (LIPITOR) 20 MG tablet Take 20 mg by mouth daily.  . Cholecalciferol (VITAMIN D3) 2000 UNITS CHEW Chew 2,000 Units by mouth.  . fluticasone (FLONASE) 50 MCG/ACT nasal spray Place 2 sprays into the nose daily as needed.  . gabapentin (NEURONTIN) 100 MG capsule Take 100 mg by mouth 3 (three) times daily.  . Melatonin 10 MG TABS Take by mouth.  . methocarbamol (ROBAXIN) 500 MG tablet Take 500 mg by mouth as needed for muscle spasms.  Marland Kitchen esomeprazole (NEXIUM) 40 MG capsule Take 40 mg by mouth daily.    No facility-administered encounter medications on file as of 04/30/2015.    Activities of Daily Living In your present state of health, do you have any difficulty performing the following activities: 04/30/2015  Hearing? N  Vision? N  Difficulty concentrating or making decisions? N  Walking or climbing stairs? N  Dressing or bathing? N  Doing errands, shopping? N  Preparing Food and eating ? N  Using the Toilet? N  In the past six months, have you accidently leaked urine? N  Do you have problems with loss of bowel control? N  Managing your Medications? N  Managing your Finances? N  Housekeeping or managing your Housekeeping? N    Patient Care Team: Biagio Borg, MD as PCP - General   Assessment:    Assessment   Patient presents for yearly preventative medicine examination. Medicare questionnaire screening were completed, i.e. Functional; fall risk; depression, memory loss and hearing; wears bilateral hearing aids provided by [place]; [facility] checks eyes as well annually;  No memory issues; Down sized home and is one level;  Fall risk reviewed and the patient is  aware and is careful, but still will do things if he feels comfortable doing them with understanding that consequences of a fall could be major. Denies depression;   Stress is manageable; Worries about wife and dtr's health. Enjoys being with the family; has 5 grand; one in [facility] and visits about x 2 every year.   All immunizations and health maintenance protocols were reviewed with the patient and needed orders were placed. Had flu shot at [employer] provided for laboratory screens;  Declines hep c  Medication reconciliation, past medical history, social history, problem list and allergies were reviewed in detail with the patient  Goals were established to maintain health and has started using a fit bit to trach his exercise;  End of life planning was discussed and has been completed; will bring a copy for the chart   Exercise Activities and Dietary recommendations Current Exercise Habits:: Home exercise routine, Time (Minutes): 60, Frequency (Times/Week): 5, Weekly Exercise (Minutes/Week): 300, Intensity: Mild  Goals    . patient centered      To maintain health ; has fitbit; stays active; > 10,000 steps a day      Fall Risk Fall Risk  04/30/2015 09/16/2014 07/11/2013  Falls in the past year? No No No   Depression Screen PHQ 2/9 Scores 04/30/2015 09/16/2014 07/11/2013  PHQ - 2 Score 0 0 0    Cognitive Testing No flowsheet data found.  AD8 score 0; no issues at present  Immunization History  Administered Date(s) Administered  . Influenza Whole 03/24/2010, 03/11/2012, 03/13/2013  . Influenza-Unspecified 03/03/2014  . Pneumococcal Conjugate-13 07/11/2013  . Pneumococcal Polysaccharide-23 03/19/2009  . Td 03/19/2009  . Zoster 02/20/2006   Screening Tests Health Maintenance  Topic Date Due  . Hepatitis C Screening  Jun 28, 1946  . INFLUENZA VACCINE  02/01/2015  . TETANUS/TDAP  03/20/2019  . COLONOSCOPY  07/23/2022  . ZOSTAVAX  Completed  . PNA vac Low Risk Adult   Completed      Plan:     Will continue to track his exercise  Watches diet; Overall; feels like his health is good  Had flu at [facility] / will document  During the course of the visit the patient was educated and counseled about the following appropriate screening and preventive services:   Vaccines to include Pneumoccal, Influenza, Hepatitis B, Td, Zostavax, HCV/ had flu  Electrocardiogram/ 04/2014  Cardiovascular Disease; eats well; is working on HDL by trying to get 10000 steps a day; starts out on treadmill every day x 30 min; does light weights; States meals are generally low fat; with occasional hamburger but not often  Colorectal cancer screening/ 10 years and [facility]   Diabetes screening/ n/a  Prostate Cancer Screening/ followed by urology annually  Glaucoma screening/ [facility]  Nutrition counseling / reviewed with good nutritional habits  Smoking cessation counseling/n/a  Patient Instructions (the written plan) was given to the patient.    WGNFA,OZHYQ, RN  04/30/2015   Medical screening examination/treatment/procedure(s) were performed by non-physician practitioner and as supervising physician I was immediately available for consultation/collaboration. I agree with above. Cathlean Cower, MD

## 2015-04-30 NOTE — Patient Instructions (Addendum)
[name] [name] , Thank you for taking time to come for your Medicare Wellness Visit. I appreciate your ongoing commitment to your health goals. Please review the following plan we discussed and let me know if I can assist you in the future.   Declines hepatitis C   Continue to maintain health and exercise and relax   These are the goals we discussed: Goals    . patient centered      To maintain health ; has fitbit; stays active; > 10,000 steps a day       This is a list of the screening recommended for you and due dates:  Health Maintenance  Topic Date Due  .  Hepatitis C: One time screening is recommended by Center for Disease Control  (CDC) for  adults born from [birth year] through 1967.   31-Jan-1946  . Flu Shot  02/01/2015  . Tetanus Vaccine  03/20/2019  . Colon Cancer Screening  07/23/2022  . Shingles Vaccine  Completed  . Pneumonia vaccines  Completed     Fall Prevention in the Home  Falls can cause injuries. They can happen to people of all ages. There are many things you can do to make your home safe and to help prevent falls.  WHAT CAN I DO ON THE OUTSIDE OF MY HOME?  Regularly fix the edges of walkways and driveways and fix any cracks.  Remove anything that might make you trip as you walk through a door, such as a raised step or threshold.  Trim any bushes or trees on the path to your home.  Use bright outdoor lighting.  Clear any walking paths of anything that might make someone trip, such as rocks or tools.  Regularly check to see if handrails are loose or broken. Make sure that both sides of any steps have handrails.  Any raised decks and porches should have guardrails on the edges.  Have any leaves, snow, or ice cleared regularly.  Use sand or salt on walking paths during winter.  Clean up any spills in your garage right away. This includes oil or grease spills. WHAT CAN I DO IN THE BATHROOM?   Use night lights.  Install grab bars by the toilet and in the tub  and shower. Do not use towel bars as grab bars.  Use non-skid mats or decals in the tub or shower.  If you need to sit down in the shower, use a plastic, non-slip stool.  Keep the floor dry. Clean up any water that spills on the floor as soon as it happens.  Remove soap buildup in the tub or shower regularly.  Attach bath mats securely with double-sided non-slip rug tape.  Do not have throw rugs and other things on the floor that can make you trip. WHAT CAN I DO IN THE BEDROOM?  Use night lights.  Make sure that you have a light by your bed that is easy to reach.  Do not use any sheets or blankets that are too big for your bed. They should not hang down onto the floor.  Have a firm chair that has side arms. You can use this for support while you get dressed.  Do not have throw rugs and other things on the floor that can make you trip. WHAT CAN I DO IN THE KITCHEN?  Clean up any spills right away.  Avoid walking on wet floors.  Keep items that you use a lot in easy-to-reach places.  If you need to  reach something above you, use a strong step stool that has a grab bar.  Keep electrical cords out of the way.  Do not use floor polish or wax that makes floors slippery. If you must use wax, use non-skid floor wax.  Do not have throw rugs and other things on the floor that can make you trip. WHAT CAN I DO WITH MY STAIRS?  Do not leave any items on the stairs.  Make sure that there are handrails on both sides of the stairs and use them. Fix handrails that are broken or loose. Make sure that handrails are as long as the stairways.  Check any carpeting to make sure that it is firmly attached to the stairs. Fix any carpet that is loose or worn.  Avoid having throw rugs at the top or bottom of the stairs. If you do have throw rugs, attach them to the floor with carpet tape.  Make sure that you have a light switch at the top of the stairs and the bottom of the stairs. If you do  not have them, ask someone to add them for you. WHAT ELSE CAN I DO TO HELP PREVENT FALLS?  Wear shoes that:  Do not have high heels.  Have rubber bottoms.  Are comfortable and fit you well.  Are closed at the toe. Do not wear sandals.  If you use a stepladder:  Make sure that it is fully opened. Do not climb a closed stepladder.  Make sure that both sides of the stepladder are locked into place.  Ask someone to hold it for you, if possible.  Clearly mark and make sure that you can see:  Any grab bars or handrails.  First and last steps.  Where the edge of each step is.  Use tools that help you move around (mobility aids) if they are needed. These include:  Canes.  Walkers.  Scooters.  Crutches.  Turn on the lights when you go into a dark area. Replace any light bulbs as soon as they burn out.  Set up your furniture so you have a clear path. Avoid moving your furniture around.  If any of your floors are uneven, fix them.  If there are any pets around you, be aware of where they are.  Review your medicines with your doctor. Some medicines can make you feel dizzy. This can increase your chance of falling. Ask your doctor what other things that you can do to help prevent falls.   This information is not intended to replace advice given to you by your health care provider. Make sure you discuss any questions you have with your health care provider.   Document Released: 04/15/2009 Document Revised: 11/03/2014 Document Reviewed: 07/24/2014 Elsevier Interactive Patient Education 2016 [place] Maintenance, Male A healthy lifestyle and preventative care can promote health and wellness.  Maintain regular health, dental, and eye exams.  Eat a healthy diet. Foods like vegetables, fruits, whole grains, low-fat dairy products, and lean protein foods contain the nutrients you need and are low in calories. Decrease your intake of foods high in solid fats,  added sugars, and salt. Get information about a proper diet from your health care provider, if necessary.  Regular physical exercise is one of the most important things you can do for your health. Most adults should get at least 150 minutes of moderate-intensity exercise (any activity that increases your heart rate and causes you to sweat) each week. In addition, most adults  need muscle-strengthening exercises on 2 or more days a week.   Maintain a healthy weight. The body mass index (BMI) is a screening tool to identify possible weight problems. It provides an estimate of body fat based on height and weight. Your health care provider can find your BMI and can help you achieve or maintain a healthy weight. For males 20 years and older:  A BMI below 18.5 is considered underweight.  A BMI of 18.5 to 24.9 is normal.  A BMI of 25 to 29.9 is considered overweight.  A BMI of 30 and above is considered obese.  Maintain normal blood lipids and cholesterol by exercising and minimizing your intake of saturated fat. Eat a balanced diet with plenty of fruits and vegetables. Blood tests for lipids and cholesterol should begin at age 6 and be repeated every 5 years. If your lipid or cholesterol levels are high, you are over age 34, or you are at high risk for heart disease, you may need your cholesterol levels checked more frequently.Ongoing high lipid and cholesterol levels should be treated with medicines if diet and exercise are not working.  If you smoke, find out from your health care provider how to quit. If you do not use tobacco, do not start.  Lung cancer screening is recommended for adults aged 88-80 years who are at high risk for developing lung cancer because of a history of smoking. A yearly low-dose CT scan of the lungs is recommended for people who have at least a 30-pack-year history of smoking and are current smokers or have quit within the past 15 years. A pack year of smoking is smoking an  average of 1 pack of cigarettes a day for 1 year (for example, a 30-pack-year history of smoking could mean smoking 1 pack a day for 30 years or 2 packs a day for 15 years). Yearly screening should continue until the smoker has stopped smoking for at least 15 years. Yearly screening should be stopped for people who develop a health problem that would prevent them from having lung cancer treatment.  If you choose to drink alcohol, do not have more than 2 drinks per day. One drink is considered to be 12 oz (360 mL) of beer, 5 oz (150 mL) of wine, or 1.5 oz (45 mL) of liquor.  Avoid the use of street drugs. Do not share needles with anyone. Ask for help if you need support or instructions about stopping the use of drugs.  High blood pressure causes heart disease and increases the risk of stroke. High blood pressure is more likely to develop in:  People who have blood pressure in the end of the normal range (100-139/85-89 mm Hg).  People who are overweight or obese.  People who are African American.  If you are 51-27 years of age, have your blood pressure checked every 3-5 years. If you are 31 years of age or older, have your blood pressure checked every year. You should have your blood pressure measured twice--once when you are at a hospital or clinic, and once when you are not at a hospital or clinic. Record the average of the two measurements. To check your blood pressure when you are not at a hospital or clinic, you can use:  An automated blood pressure machine at a pharmacy.  A home blood pressure monitor.  If you are 10-43 years old, ask your health care provider if you should take aspirin to prevent heart disease.  Diabetes screening involves  taking a blood sample to check your fasting blood sugar level. This should be done once every 3 years after age 21 if you are at a normal weight and without risk factors for diabetes. Testing should be considered at a younger age or be carried out more  frequently if you are overweight and have at least 1 risk factor for diabetes.  Colorectal cancer can be detected and often prevented. Most routine colorectal cancer screening begins at the age of 59 and continues through age 86. However, your health care provider may recommend screening at an earlier age if you have risk factors for colon cancer. On a yearly basis, your health care provider may provide home test kits to check for hidden blood in the stool. A small camera at the end of a tube may be used to directly examine the colon (sigmoidoscopy or colonoscopy) to detect the earliest forms of colorectal cancer. Talk to your health care provider about this at age 55 when routine screening begins. A direct exam of the colon should be repeated every 5-10 years through age 88, unless early forms of precancerous polyps or small growths are found.  People who are at an increased risk for hepatitis B should be screened for this virus. You are considered at high risk for hepatitis B if:  You were born in a country where hepatitis B occurs often. Talk with your health care provider about which countries are considered high risk.  Your parents were born in a high-risk country and you have not received a shot to protect against hepatitis B (hepatitis B vaccine).  You have HIV or AIDS.  You use needles to inject street drugs.  You live with, or have sex with, someone who has hepatitis B.  You are a man who has sex with other men (MSM).  You get hemodialysis treatment.  You take certain medicines for conditions like cancer, organ transplantation, and autoimmune conditions.  Hepatitis C blood testing is recommended for all people born from [birth year] through 1965 and any individual with known risk factors for hepatitis C.  Healthy men should no longer receive prostate-specific antigen (PSA) blood tests as part of routine cancer screening. Talk to your health care provider about prostate cancer  screening.  Testicular cancer screening is not recommended for adolescents or adult males who have no symptoms. Screening includes self-exam, a health care provider exam, and other screening tests. Consult with your health care provider about any symptoms you have or any concerns you have about testicular cancer.  Practice safe sex. Use condoms and avoid high-risk sexual practices to reduce the spread of sexually transmitted infections (STIs).  You should be screened for STIs, including gonorrhea and chlamydia if:  You are sexually active and are younger than 24 years.  You are older than 24 years, and your health care provider tells you that you are at risk for this type of infection.  Your sexual activity has changed since you were last screened, and you are at an increased risk for chlamydia or gonorrhea. Ask your health care provider if you are at risk.  If you are at risk of being infected with HIV, it is recommended that you take a prescription medicine daily to prevent HIV infection. This is called pre-exposure prophylaxis (PrEP). You are considered at risk if:  You are a man who has sex with other men (MSM).  You are a heterosexual man who is sexually active with multiple partners.  You take drugs by injection.  You are sexually active with a partner who has HIV.  Talk with your health care provider about whether you are at high risk of being infected with HIV. If you choose to begin PrEP, you should first be tested for HIV. You should then be tested every 3 months for as long as you are taking PrEP.  Use sunscreen. Apply sunscreen liberally and repeatedly throughout the day. You should seek shade when your shadow is shorter than you. Protect yourself by wearing long sleeves, pants, a wide-brimmed hat, and sunglasses year round whenever you are outdoors.  Tell your health care provider of new moles or changes in moles, especially if there is a change in shape or color. Also, tell  your health care provider if a mole is larger than the size of a pencil eraser.  A one-time screening for abdominal aortic aneurysm (AAA) and surgical repair of large AAAs by ultrasound is recommended for men aged 63-75 years who are current or former smokers.  Stay current with your vaccines (immunizations).   This information is not intended to replace advice given to you by your health care provider. Make sure you discuss any questions you have with your health care provider.   Document Released: 12/16/2007 Document Revised: 07/10/2014 Document Reviewed: 11/14/2010 Elsevier Interactive Patient Education [employer].

## 2015-05-06 DIAGNOSIS — L578 Other skin changes due to chronic exposure to nonionizing radiation: Secondary | ICD-10-CM | POA: Diagnosis not present

## 2015-05-06 DIAGNOSIS — L905 Scar conditions and fibrosis of skin: Secondary | ICD-10-CM | POA: Diagnosis not present

## 2015-05-06 DIAGNOSIS — L821 Other seborrheic keratosis: Secondary | ICD-10-CM | POA: Diagnosis not present

## 2015-05-06 DIAGNOSIS — L570 Actinic keratosis: Secondary | ICD-10-CM | POA: Diagnosis not present

## 2015-05-13 ENCOUNTER — Encounter: Payer: Self-pay | Admitting: Family Medicine

## 2015-05-13 ENCOUNTER — Ambulatory Visit (INDEPENDENT_AMBULATORY_CARE_PROVIDER_SITE_OTHER): Payer: Medicare Other | Admitting: Family Medicine

## 2015-05-13 VITALS — BP 130/72 | HR 82 | Ht 67.0 in | Wt 157.0 lb

## 2015-05-13 DIAGNOSIS — M24552 Contracture, left hip: Secondary | ICD-10-CM

## 2015-05-13 MED ORDER — TIZANIDINE HCL 4 MG PO TABS: 4.0000 mg | ORAL_TABLET | Freq: Every evening | ORAL | Status: DC

## 2015-05-13 NOTE — Progress Notes (Signed)
[name] [place], [place] [address] Phone: [phone] Subjective:     CC: Left sided back pain  [id number] [name] [name] is a 69 y.o. male coming in with complaint of left sided back pain. Seems to be after he walks seems to make worse. Seems to be worse during the end of the day. Patient has started increasing his walking from one hour dated 2 hours a day. Does do a lot of hills. States that then he sits for long amount of time the nuts with the pain starts. Localized. Denies any radiation down the leg. Denies any numbness or tingling. Rates the severity of pain a 5 out of 10. Does respond well to muscle relaxer and gabapentin at night. Denies any fevers, chills, or abnormal weight loss.  Past Medical History  Diagnosis Date  . Old myocardial infarction 1989  . HTN (hypertension)   . HLD (hyperlipidemia)   . CAD (coronary artery disease)   . Hearing loss in right ear   . Atherosclerosis of aorta ([place])   . Inguinal hernia without mention of obstruction or gangrene, unilateral or unspecified, (not specified as recurrent)   . Chronic prostatitis   . BPH (benign prostatic hyperplasia)   . PUD (peptic ulcer disease)   . Diverticulitis   . Allergic rhinitis   . DJD (degenerative joint disease), lumbosacral   . Transient amnesia   . Ocular migraine   . Anxiety 05/01/2011   Past Surgical History  Procedure Laterality Date  . Inguinal hernia repair    . Appendectomy    . Coronary stent placement     Social History  Substance Use Topics  . Smoking status: Former Smoker    Quit date: 07/03/1980  . Smokeless tobacco: Never Used  . Alcohol Use: 0.0 oz/week    0 Standard drinks or equivalent per week     Comment: a few beers or glasses of wine a week prior to dinner   Allergies  Allergen Reactions  . Niacin     Hot flashes  . Other Other (See Comments)    Environmental allergies/ post nasal drip  . Penicillins     As child:  muscles stiffened   Family History  Problem Relation Age of Onset  . Alcohol abuse Father   . Cancer Father   . Alcohol abuse Brother   . Cancer      grandfather  . Hypertension      grandfather  . Heart disease      grandfather  . Colon cancer Neg Hx   . Stomach cancer Neg Hx         Past medical history, social, surgical and family history all reviewed in electronic medical record.   Review of Systems: No headache, visual changes, nausea, vomiting, diarrhea, constipation, dizziness, abdominal pain, skin rash, fevers, chills, night sweats, weight loss, swollen lymph nodes, body aches, joint swelling, muscle aches, chest pain, shortness of breath, mood changes.   Objective Blood pressure 130/72, pulse 82, height 5\' 7"  (1.702 m), weight 157 lb (71.215 kg), SpO2 98 %.  General: No apparent distress alert and oriented x3 mood and affect normal, dressed appropriately.  HEENT: Pupils equal, extraocular movements intact  Respiratory: Patient's speak in full sentences and does not appear short of breath  Cardiovascular: No lower extremity edema, non tender, no erythema  Skin: Warm dry intact with no signs of infection or rash on extremities or on axial skeleton.  Abdomen: Soft nontender  Neuro: Cranial nerves II through XII are intact, neurovascularly intact in all extremities with 2+ DTRs and 2+ pulses.  Lymph: No lymphadenopathy of posterior or anterior cervical chain or axillae bilaterally.  Gait normal with good balance and coordination.  MSK:  Non tender with full range of motion and good stability and symmetric strength and tone of shoulders, elbows, wrist, hip, knee and ankles bilaterally.  Back Exam:  Inspection: Unremarkable  Motion: Flexion 35 deg, Extension 25 deg, Side Bending to 35 deg bilaterally,  Rotation to 35 deg bilaterally  SLR laying: Negative  XSLR laying: Negative  Palpable tenderness: Tender to palpation over the thoracolumbar juncture of the left side  paraspinal musculature FABER: negative. Sensory change: Gross sensation intact to all lumbar and sacral dermatomes.  Reflexes: 2+ at both patellar tendons, 2+ at achilles tendons, Babinski's downgoing.  Strength at foot  Plantar-flexion: 5/5 Dorsi-flexion: 5/5 Eversion: 5/5 Inversion: 5/5  Leg strength  Quad: 5/5 Hamstring: 5/5 Hip flexor: 5/5 but very tight on the left side Hip abductors: 5/5  Gait unremarkable.    Impression and Recommendations:     This case required medical decision making of moderate complexity.

## 2015-05-13 NOTE — Patient Instructions (Signed)
Great to see you Ice 20 minutes 2 times daily. Usually after activity and before bed. Exercises AFTER walking Try duexis 3 times a day for 3 days Zanaflex if needed at night Gabapentin 100-200mg  at night See me again in 3 weeks.  Happy holidays!

## 2015-05-13 NOTE — Progress Notes (Signed)
Pre visit review using our clinic review tool, if applicable. No additional management support is needed unless otherwise documented below in the visit note. 

## 2015-05-13 NOTE — Assessment & Plan Note (Signed)
I believe the patient is having more of a tight hip flexor. Patient does not have any signs or symptoms of radiculopathy at this time or any weakness. Patient's would like him to remain active. We discussed icing regimen, home exercises, oral anti-inflammatories for short course and patient will take gabapentin regularly at night. Muscle relaxer for breakthrough pain. Patient will come back again in 2-3 weeks for further evaluation or call sooner if pain seems to worsen.

## 2015-06-01 DIAGNOSIS — M722 Plantar fascial fibromatosis: Secondary | ICD-10-CM | POA: Diagnosis not present

## 2015-06-03 ENCOUNTER — Ambulatory Visit (INDEPENDENT_AMBULATORY_CARE_PROVIDER_SITE_OTHER): Payer: Medicare Other | Admitting: Family Medicine

## 2015-06-03 ENCOUNTER — Encounter: Payer: Self-pay | Admitting: Family Medicine

## 2015-06-03 VITALS — BP 130/80 | HR 82 | Ht 67.0 in | Wt 161.0 lb

## 2015-06-03 DIAGNOSIS — M24552 Contracture, left hip: Secondary | ICD-10-CM | POA: Diagnosis not present

## 2015-06-03 MED ORDER — GABAPENTIN 100 MG PO CAPS: 200.0000 mg | ORAL_CAPSULE | Freq: Every day | ORAL | Status: DC

## 2015-06-03 NOTE — Progress Notes (Signed)
Pre visit review using our clinic review tool, if applicable. No additional management support is needed unless otherwise documented below in the visit note. 

## 2015-06-03 NOTE — Patient Instructions (Addendum)
Good to see you [name] [name] is your friend Stay active.  Lets increase gabapentin to 200mg  at night Continue the muscle relaxer Good shoes with rigid bottom.  [name] [name], [place] or New balance greater then 700 But I like the new ones you have See me again in 2 months.  Happy holidays!

## 2015-06-03 NOTE — Assessment & Plan Note (Signed)
Patient has been some mild improvement but not anything considerable. We did change medications today increasing his gabapentin at night. Prescription given. We discussed the exercises doing a more at night. We discussed the importance of hydration and protein supplementation. Lungs patient does not have any significant pain in the daytime I would not like to change any significant aspects. Patient and will come back and see me again in 2 months.  Spent  25 minutes with patient face-to-face and had greater than 50% of counseling including as described above in assessment and plan.

## 2015-06-03 NOTE — Progress Notes (Signed)
[name] [place], [place] [address] Phone: [phone] Subjective:     CC: Left sided back pain follow up  [id number] [name] [name] is a 69 y.o. male coming in with complaint of left sided back pain. Patient was found to have severe tight hip flexors especially on the right side. Patient was given gabapentin to take at night, Zanaflex and only take anti-inflammatory is occasionally. There is concern for possible involvement of the hips but only mild arthritis seen on ultrasound. We think did tight hip flexors are likely compensating for the old adductor injury. Patient was to start focusing on stretches. Patient states continues to do very well during the day. Has some stiffness and pain when he wakes up in the morning. Tried to adjust his bed with no significant success. States that the muscle relaxer and gabapentin does help keep asleep. Nothing affecting him during the day.  Past Medical History  Diagnosis Date  . Old myocardial infarction 1989  . HTN (hypertension)   . HLD (hyperlipidemia)   . CAD (coronary artery disease)   . Hearing loss in right ear   . Atherosclerosis of aorta ([place])   . Inguinal hernia without mention of obstruction or gangrene, unilateral or unspecified, (not specified as recurrent)   . Chronic prostatitis   . BPH (benign prostatic hyperplasia)   . PUD (peptic ulcer disease)   . Diverticulitis   . Allergic rhinitis   . DJD (degenerative joint disease), lumbosacral   . Transient amnesia   . Ocular migraine   . Anxiety 05/01/2011   Past Surgical History  Procedure Laterality Date  . Inguinal hernia repair    . Appendectomy    . Coronary stent placement     Social History  Substance Use Topics  . Smoking status: Former Smoker    Quit date: 07/03/1980  . Smokeless tobacco: Never Used  . Alcohol Use: 0.0 oz/week    0 Standard drinks or equivalent per week     Comment: a few beers or glasses of wine a week  prior to dinner   Allergies  Allergen Reactions  . Niacin     Hot flashes  . Other Other (See Comments)    Environmental allergies/ post nasal drip  . Penicillins     As child: muscles stiffened   Family History  Problem Relation Age of Onset  . Alcohol abuse Father   . Cancer Father   . Alcohol abuse Brother   . Cancer      grandfather  . Hypertension      grandfather  . Heart disease      grandfather  . Colon cancer Neg Hx   . Stomach cancer Neg Hx         Past medical history, social, surgical and family history all reviewed in electronic medical record.   Review of Systems: No headache, visual changes, nausea, vomiting, diarrhea, constipation, dizziness, abdominal pain, skin rash, fevers, chills, night sweats, weight loss, swollen lymph nodes, body aches, joint swelling, muscle aches, chest pain, shortness of breath, mood changes.   Objective Blood pressure 130/80, pulse 82, height 5\' 7"  (1.702 m), weight 161 lb (73.029 kg), SpO2 97 %.  General: No apparent distress alert and oriented x3 mood and affect normal, dressed appropriately.  HEENT: Pupils equal, extraocular movements intact  Respiratory: Patient's speak in full sentences and does not appear short of breath  Cardiovascular: No lower extremity edema, non tender, no erythema  Skin:  Warm dry intact with no signs of infection or rash on extremities or on axial skeleton.  Abdomen: Soft nontender  Neuro: Cranial nerves II through XII are intact, neurovascularly intact in all extremities with 2+ DTRs and 2+ pulses.  Lymph: No lymphadenopathy of posterior or anterior cervical chain or axillae bilaterally.  Gait normal with good balance and coordination.  MSK:  Non tender with full range of motion and good stability and symmetric strength and tone of shoulders, elbows, wrist, hip, knee and ankles bilaterally.  Back Exam:  Inspection: Unremarkable  Motion: Flexion 35 deg, Extension 25 deg, Side Bending to 35 deg  bilaterally,  Rotation to 35 deg bilaterally  SLR laying: Negative  XSLR laying: Negative  Palpable tenderness: Tender to palpation over the thoracolumbar juncture of the left side paraspinal musculature but possibly less than previous exam FABER: negative. Sensory change: Gross sensation intact to all lumbar and sacral dermatomes.  Reflexes: 2+ at both patellar tendons, 2+ at achilles tendons, Babinski's downgoing.  Strength at foot  Plantar-flexion: 5/5 Dorsi-flexion: 5/5 Eversion: 5/5 Inversion: 5/5  Leg strength  Quad: 5/5 Hamstring: 5/5 Hip flexor: 5/5 but very tight on the left side still present and seems to be consistent with previous exam Hip abductors: 5/5  Gait unremarkable.    Impression and Recommendations:     This case required medical decision making of moderate complexity.

## 2015-07-21 ENCOUNTER — Ambulatory Visit (INDEPENDENT_AMBULATORY_CARE_PROVIDER_SITE_OTHER): Payer: Medicare Other | Admitting: Internal Medicine

## 2015-07-21 ENCOUNTER — Other Ambulatory Visit (INDEPENDENT_AMBULATORY_CARE_PROVIDER_SITE_OTHER): Payer: Medicare Other

## 2015-07-21 VITALS — BP 124/80 | HR 83 | Temp 98.3°F | Resp 20 | Ht 67.0 in | Wt 157.2 lb

## 2015-07-21 DIAGNOSIS — F419 Anxiety disorder, unspecified: Secondary | ICD-10-CM | POA: Diagnosis not present

## 2015-07-21 DIAGNOSIS — I10 Essential (primary) hypertension: Secondary | ICD-10-CM

## 2015-07-21 DIAGNOSIS — J309 Allergic rhinitis, unspecified: Secondary | ICD-10-CM

## 2015-07-21 DIAGNOSIS — Z1159 Encounter for screening for other viral diseases: Secondary | ICD-10-CM

## 2015-07-21 DIAGNOSIS — E785 Hyperlipidemia, unspecified: Secondary | ICD-10-CM

## 2015-07-21 DIAGNOSIS — N320 Bladder-neck obstruction: Secondary | ICD-10-CM | POA: Diagnosis not present

## 2015-07-21 DIAGNOSIS — Z0000 Encounter for general adult medical examination without abnormal findings: Secondary | ICD-10-CM

## 2015-07-21 LAB — URINALYSIS, ROUTINE W REFLEX MICROSCOPIC
BILIRUBIN URINE: NEGATIVE
HGB URINE DIPSTICK: NEGATIVE
LEUKOCYTES UA: NEGATIVE
NITRITE: NEGATIVE
RBC / HPF: NONE SEEN (ref 0–?)
Specific Gravity, Urine: 1.015 (ref 1.000–1.030)
Total Protein, Urine: NEGATIVE
UROBILINOGEN UA: 0.2 (ref 0.0–1.0)
Urine Glucose: NEGATIVE
pH: 6 (ref 5.0–8.0)

## 2015-07-21 LAB — HEPATIC FUNCTION PANEL
ALT: 41 U/L (ref 0–53)
AST: 31 U/L (ref 0–37)
Albumin: 4 g/dL (ref 3.5–5.2)
Alkaline Phosphatase: 82 U/L (ref 39–117)
Bilirubin, Direct: 0.1 mg/dL (ref 0.0–0.3)
Total Bilirubin: 0.5 mg/dL (ref 0.2–1.2)
Total Protein: 6.5 g/dL (ref 6.0–8.3)

## 2015-07-21 LAB — BASIC METABOLIC PANEL WITH GFR
BUN: 13 mg/dL (ref 6–23)
CO2: 30 meq/L (ref 19–32)
Calcium: 9 mg/dL (ref 8.4–10.5)
Chloride: 104 meq/L (ref 96–112)
Creatinine, Ser: 1.12 mg/dL (ref 0.40–1.50)
GFR: 68.62 mL/min
Glucose, Bld: 96 mg/dL (ref 70–99)
Potassium: 4.2 meq/L (ref 3.5–5.1)
Sodium: 142 meq/L (ref 135–145)

## 2015-07-21 LAB — CBC WITH DIFFERENTIAL/PLATELET
Basophils Absolute: 0 K/uL (ref 0.0–0.1)
Basophils Relative: 0.3 % (ref 0.0–3.0)
Eosinophils Absolute: 0.1 K/uL (ref 0.0–0.7)
Eosinophils Relative: 1.4 % (ref 0.0–5.0)
HCT: 43.6 % (ref 39.0–52.0)
Hemoglobin: 14.7 g/dL (ref 13.0–17.0)
Lymphocytes Relative: 31.1 % (ref 12.0–46.0)
Lymphs Abs: 1.7 K/uL (ref 0.7–4.0)
MCHC: 33.7 g/dL (ref 30.0–36.0)
MCV: 93.2 fl (ref 78.0–100.0)
Monocytes Absolute: 0.6 K/uL (ref 0.1–1.0)
Monocytes Relative: 11.1 % (ref 3.0–12.0)
Neutro Abs: 3 K/uL (ref 1.4–7.7)
Neutrophils Relative %: 56.1 % (ref 43.0–77.0)
Platelets: 203 K/uL (ref 150.0–400.0)
RBC: 4.68 Mil/uL (ref 4.22–5.81)
RDW: 12.3 % (ref 11.5–15.5)
WBC: 5.4 K/uL (ref 4.0–10.5)

## 2015-07-21 LAB — LIPID PANEL
CHOL/HDL RATIO: 3
Cholesterol: 126 mg/dL (ref 0–200)
HDL: 40.4 mg/dL (ref >39.00)
LDL Cholesterol: 68 mg/dL (ref 0–99)
NonHDL: 85.5
Triglycerides: 89 mg/dL (ref 0.0–149.0)
VLDL: 17.8 mg/dL (ref 0.0–40.0)

## 2015-07-21 LAB — PSA: PSA: 0.95 ng/mL (ref 0.10–4.00)

## 2015-07-21 LAB — TSH: TSH: 1.55 u[IU]/mL (ref 0.35–4.50)

## 2015-07-21 MED ORDER — MONTELUKAST SODIUM 10 MG PO TABS: 10.0000 mg | ORAL_TABLET | Freq: Every day | ORAL | Status: DC

## 2015-07-21 NOTE — Progress Notes (Signed)
Pre visit review using our clinic review tool, if applicable. No additional management support is needed unless otherwise documented below in the visit note. 

## 2015-07-21 NOTE — Assessment & Plan Note (Signed)
stable overall by history and exam, recent data reviewed with pt, and pt to continue medical treatment as before,  to f/u any worsening symptoms or concerns Lab Results  Component Value Date   WBC 5.3 09/16/2014   HGB 13.8 09/16/2014   HCT 39.9 09/16/2014   PLT 182.0 09/16/2014   GLUCOSE 102* 09/16/2014   CHOL 140 09/16/2014   TRIG 131.0 09/16/2014   HDL 38.80* 09/16/2014   LDLDIRECT 116.9 04/27/2010   LDLCALC 75 09/16/2014   ALT 23 09/16/2014   AST 24 09/16/2014   NA 141 09/16/2014   K 3.9 09/16/2014   CL 104 09/16/2014   CREATININE 1.04 09/16/2014   BUN 15 09/16/2014   CO2 32 09/16/2014   TSH 1.78 09/16/2014   PSA 1.05 09/16/2014   INR 1.0 ratio 06/03/2010   HGBA1C 5.7 03/19/2009

## 2015-07-21 NOTE — Assessment & Plan Note (Signed)
Mild to mod, for singulari 10 qd,,  to f/u any worsening symptoms or concerns

## 2015-07-21 NOTE — Progress Notes (Signed)
Subjective:    Patient ID: [name] [name], male    DOB: 1945/08/17, 70 y.o.   MRN: [id number]  HPI  Here for yearly f/u;  Overall doing ok;  Pt denies Chest pain, worsening SOB, DOE, wheezing, orthopnea, PND, worsening LE edema, palpitations, dizziness or syncope.  Pt denies neurological change such as new headache, facial or extremity weakness.  Pt denies polydipsia, polyuria, or low sugar symptoms. Pt states overall good compliance with treatment and medications, good tolerability, and has been trying to follow appropriate diet.  Pt denies worsening depressive symptoms, suicidal ideation or panic. No fever, night sweats, wt loss, loss of appetite, or other constitutional symptoms.  Pt states good ability with ADL's, has low fall risk, home safety reviewed and adequate, no other significant changes in hearing or vision, and only occasionally active with exercise. Does have several wks ongoing nasal allergy symptoms with clearish congestion, itch and sneezing, without fever, pain, ST, cough, swelling or wheezing. Wt Readings from Last 3 Encounters:  07/21/15 157 lb 4 oz (71.328 kg)  06/03/15 161 lb (73.029 kg)  05/13/15 157 lb (71.215 kg)   Past Medical History  Diagnosis Date  . Old myocardial infarction 1989  . HTN (hypertension)   . HLD (hyperlipidemia)   . CAD (coronary artery disease)   . Hearing loss in right ear   . Atherosclerosis of aorta ([place])   . Inguinal hernia without mention of obstruction or gangrene, unilateral or unspecified, (not specified as recurrent)   . Chronic prostatitis   . BPH (benign prostatic hyperplasia)   . PUD (peptic ulcer disease)   . Diverticulitis   . Allergic rhinitis   . DJD (degenerative joint disease), lumbosacral   . Transient amnesia   . Ocular migraine   . Anxiety 05/01/2011   Past Surgical History  Procedure Laterality Date  . Inguinal hernia repair    . Appendectomy    . Coronary stent placement      reports that he quit smoking about 35  years ago. He has never used smokeless tobacco. He reports that he drinks alcohol. He reports that he does not use illicit drugs. family history includes Alcohol abuse in his brother and father; Cancer in his father. There is no history of Colon cancer or Stomach cancer. Allergies  Allergen Reactions  . Niacin     Hot flashes  . Other Other (See Comments)    Environmental allergies/ post nasal drip  . Penicillins     As child: muscles stiffened   Current Outpatient Prescriptions on File Prior to Visit  Medication Sig Dispense Refill  . aspirin 81 MG tablet Take 81 mg by mouth daily.    Marland Kitchen atorvastatin (LIPITOR) 20 MG tablet Take 20 mg by mouth daily.    . Cholecalciferol (VITAMIN D3) 2000 UNITS CHEW Chew 2,000 Units by mouth.    . fluticasone (FLONASE) 50 MCG/ACT nasal spray Place 2 sprays into the nose daily as needed.    . gabapentin (NEURONTIN) 100 MG capsule Take 2 capsules (200 mg total) by mouth at bedtime. 180 capsule 3  . Melatonin 10 MG TABS Take by mouth.    Marland Kitchen tiZANidine (ZANAFLEX) 4 MG tablet Take 1 tablet (4 mg total) by mouth Nightly. 30 tablet 2   No current facility-administered medications on file prior to visit.     Review of Systems Constitutional: Negative for increased diaphoresis, other activity, appetite or siginficant weight change other than noted HENT: Negative for worsening hearing loss, ear pain, facial  swelling, mouth sores and neck stiffness.   Eyes: Negative for other worsening pain, redness or visual disturbance.  Respiratory: Negative for shortness of breath and wheezing  Cardiovascular: Negative for chest pain and palpitations.  Gastrointestinal: Negative for diarrhea, blood in stool, abdominal distention or other pain Genitourinary: Negative for hematuria, flank pain or change in urine volume.  Musculoskeletal: Negative for myalgias or other joint complaints.  Skin: Negative for color change and wound or drainage.  Neurological: Negative for syncope  and numbness. other than noted Hematological: Negative for adenopathy. or other swelling Psychiatric/Behavioral: Negative for hallucinations, SI, self-injury, decreased concentration or other worsening agitation.      Objective:   Physical Exam BP 124/80 mmHg  Pulse 83  Temp(Src) 98.3 F (36.8 C) (Oral)  Resp 20  Ht 5\' 7"  (1.702 m)  Wt 157 lb 4 oz (71.328 kg)  BMI 24.62 kg/m2  SpO2 97% VS noted, wearing hearing aids Constitutional: Pt is oriented to person, place, and time. Appears well-developed and well-nourished, in no significant distress Head: Normocephalic and atraumatic.  Right Ear: External ear normal.  Left Ear: External ear normal.  Nose: Nose normal.  Mouth/Throat: Oropharynx is clear and moist.  Eyes: Conjunctivae and EOM are normal. Pupils are equal, round, and reactive to light.  Neck: Normal range of motion. Neck supple. No JVD present. No tracheal deviation present or significant neck LA or mass Cardiovascular: Normal rate, regular rhythm, normal heart sounds and intact distal pulses.   Pulmonary/Chest: Effort normal and breath sounds without rales or wheezing  Abdominal: Soft. Bowel sounds are normal. NT. No HSM  Musculoskeletal: Normal range of motion. Exhibits no edema.  Lymphadenopathy:  Has no cervical adenopathy.  Neurological: Pt is alert and oriented to person, place, and time. Pt has normal reflexes. No cranial nerve deficit. Motor grossly intact Skin: Skin is warm and dry. No rash noted.  Psychiatric:  Has normal mood and affect. Behavior is normal.      Assessment & Plan:

## 2015-07-21 NOTE — Assessment & Plan Note (Addendum)
stable overall by history and exam, recent data reviewed with pt, and pt to continue medical treatment as before,  to f/u any worsening symptoms or concerns BP Readings from Last 3 Encounters:  07/21/15 124/80  06/03/15 130/80  05/13/15 130/72   ECG reviewed as per emr

## 2015-07-21 NOTE — Assessment & Plan Note (Signed)
stable overall by history and exam, recent data reviewed with pt, and pt to continue medical treatment as before,  to f/u any worsening symptoms or concerns Lab Results  Component Value Date   LDLCALC 75 09/16/2014

## 2015-07-21 NOTE — Patient Instructions (Signed)
Please take all new medication as prescribed - the singulair 10 mg per day  Please continue all other medications as before, and refills have been done if requested.  Please have the pharmacy call with any other refills you may need.  Please continue your efforts at being more active, low cholesterol diet, and weight control.  You are otherwise up to date with prevention measures today.  Please keep your appointments with your specialists as you may have planned  Please go to the LAB in the Basement (turn left off the elevator) for the tests to be done today  You will be contacted by phone if any changes need to be made immediately.  Otherwise, you will receive a letter about your results with an explanation, but please check with MyChart first.  Please remember to sign up for MyChart if you have not done so, as this will be important to you in the future with finding out test results, communicating by private email, and scheduling acute appointments online when needed.  Please return in 1 year for your yearly visit, or sooner if needed

## 2015-07-22 LAB — HEPATITIS C ANTIBODY: HCV AB: NEGATIVE

## 2015-08-05 DIAGNOSIS — I252 Old myocardial infarction: Secondary | ICD-10-CM | POA: Diagnosis not present

## 2015-08-05 DIAGNOSIS — E785 Hyperlipidemia, unspecified: Secondary | ICD-10-CM | POA: Diagnosis not present

## 2015-08-05 DIAGNOSIS — Z6823 Body mass index (BMI) 23.0-23.9, adult: Secondary | ICD-10-CM | POA: Diagnosis not present

## 2015-08-05 DIAGNOSIS — I2510 Atherosclerotic heart disease of native coronary artery without angina pectoris: Secondary | ICD-10-CM | POA: Diagnosis not present

## 2015-09-06 DIAGNOSIS — D485 Neoplasm of uncertain behavior of skin: Secondary | ICD-10-CM | POA: Diagnosis not present

## 2015-09-06 DIAGNOSIS — C44311 Basal cell carcinoma of skin of nose: Secondary | ICD-10-CM | POA: Diagnosis not present

## 2015-10-11 ENCOUNTER — Telehealth: Payer: Self-pay | Admitting: Family Medicine

## 2015-10-11 NOTE — Telephone Encounter (Signed)
Spoke with pt. Scheduled him tomorrwo @ 930am.

## 2015-10-11 NOTE — Telephone Encounter (Signed)
Patient has twisted his knee and requested to be seen. Advised that there is no access this week (greg is off on Friday)  Is there a way we can fit him in at any point this week?   Verified cell # is the best # to get him

## 2015-10-12 ENCOUNTER — Encounter: Payer: Self-pay | Admitting: Family Medicine

## 2015-10-12 ENCOUNTER — Other Ambulatory Visit (INDEPENDENT_AMBULATORY_CARE_PROVIDER_SITE_OTHER): Payer: Medicare Other

## 2015-10-12 ENCOUNTER — Ambulatory Visit (INDEPENDENT_AMBULATORY_CARE_PROVIDER_SITE_OTHER): Payer: Medicare Other | Admitting: Family Medicine

## 2015-10-12 VITALS — BP 112/72 | HR 78 | Ht 67.0 in | Wt 150.0 lb

## 2015-10-12 DIAGNOSIS — M23203 Derangement of unspecified medial meniscus due to old tear or injury, right knee: Secondary | ICD-10-CM | POA: Diagnosis not present

## 2015-10-12 DIAGNOSIS — M25561 Pain in right knee: Secondary | ICD-10-CM

## 2015-10-12 DIAGNOSIS — M23200 Derangement of unspecified lateral meniscus due to old tear or injury, right knee: Secondary | ICD-10-CM

## 2015-10-12 DIAGNOSIS — M23209 Derangement of unspecified meniscus due to old tear or injury, unspecified knee: Secondary | ICD-10-CM | POA: Insufficient documentation

## 2015-10-12 DIAGNOSIS — M23206 Derangement of unspecified meniscus due to old tear or injury, right knee: Secondary | ICD-10-CM

## 2015-10-12 NOTE — Patient Instructions (Signed)
Good to see you.  Ice 20 minutes 2 times daily. Usually after activity and before bed. Exercises 3 times a week.  pennsaid pinkie amount topically 2 times daily as needed.  Turmeric 500mg  twice daily  Tart cherry extract any dose at night  Tommy copper compression sleeve daily with activity  See me again in 3-4 weeks to make sure you are healing

## 2015-10-12 NOTE — Progress Notes (Signed)
Pre visit review using our clinic review tool, if applicable. No additional management support is needed unless otherwise documented below in the visit note. 

## 2015-10-12 NOTE — Progress Notes (Signed)
[name] [address] [place], [place] [address] Phone: [phone] Subjective:    I'm seeing this patient by the request  of:  Cathlean Cower, MD  CC: right knee pain  [id number] [name] [name] is a 70 y.o. male coming in with complaint of right knee pain. Patient has been very active recently. Patient states that he extend the knee and had a significant amount of pain on the lateral aspect. Patient states now when he does a twisting motion he has some pain. Rates the severity is 4 out of 10. No significant instability but sometimes can catch him. Has not notice any swelling or numbness. No weakness.     Past Medical History  Diagnosis Date  . Old myocardial infarction 1989  . HTN (hypertension)   . HLD (hyperlipidemia)   . CAD (coronary artery disease)   . Hearing loss in right ear   . Atherosclerosis of aorta ([place])   . Inguinal hernia without mention of obstruction or gangrene, unilateral or unspecified, (not specified as recurrent)   . Chronic prostatitis   . BPH (benign prostatic hyperplasia)   . PUD (peptic ulcer disease)   . Diverticulitis   . Allergic rhinitis   . DJD (degenerative joint disease), lumbosacral   . Transient amnesia   . Ocular migraine   . Anxiety 05/01/2011   Past Surgical History  Procedure Laterality Date  . Inguinal hernia repair    . Appendectomy    . Coronary stent placement     Social History   Social History  . Marital Status: Married    Spouse Name: N/A  . Number of Children: 3  . Years of Education: N/A   Occupational History  . retired    Social History Main Topics  . Smoking status: Former Smoker    Quit date: 07/03/1980  . Smokeless tobacco: Never Used  . Alcohol Use: 0.0 oz/week    0 Standard drinks or equivalent per week     Comment: a few beers or glasses of wine a week prior to dinner  . Drug Use: No  . Sexual Activity: Not Asked   Other Topics Concern  . None   Social History  Narrative   Allergies  Allergen Reactions  . Niacin     Hot flashes  . Other Other (See Comments)    Environmental allergies/ post nasal drip  . Penicillins     As child: muscles stiffened   Family History  Problem Relation Age of Onset  . Alcohol abuse Father   . Cancer Father   . Alcohol abuse Brother   . Cancer      grandfather  . Hypertension      grandfather  . Heart disease      grandfather  . Colon cancer Neg Hx   . Stomach cancer Neg Hx     Past medical history, social, surgical and family history all reviewed in electronic medical record.  No pertanent information unless stated regarding to the chief complaint.   Review of Systems: No headache, visual changes, nausea, vomiting, diarrhea, constipation, dizziness, abdominal pain, skin rash, fevers, chills, night sweats, weight loss, swollen lymph nodes, body aches, joint swelling, muscle aches, chest pain, shortness of breath, mood changes.   Objective Blood pressure 112/72, pulse 78, height 5\' 7"  (1.702 m), weight 150 lb (68.04 kg), SpO2 97 %.  General: No apparent distress alert and oriented x3 mood and affect normal, dressed appropriately.  HEENT: Pupils equal, extraocular  movements intact  Respiratory: Patient's speak in full sentences and does not appear short of breath  Cardiovascular: No lower extremity edema, non tender, no erythema  Skin: Warm dry intact with no signs of infection or rash on extremities or on axial skeleton.  Abdomen: Soft nontender  Neuro: Cranial nerves II through XII are intact, neurovascularly intact in all extremities with 2+ DTRs and 2+ pulses.  Lymph: No lymphadenopathy of posterior or anterior cervical chain or axillae bilaterally.  Gait normal with good balance and coordination.  MSK:  Non tender with full range of motion and good stability and symmetric strength and tone of shoulders, elbows, wrist, hip, and ankles bilaterally.  Knee:right knee Normal to inspection with no erythema  or effusion or obvious bony abnormalities. Mild discomfort over the lateral joint line ROM full in flexion and extension and lower leg rotation. Ligaments with solid consistent endpoints including ACL, PCL, LCL, MCL. positiveMcmurray's, Apley's, and Thessalonian tests. Non painful patellar compression. Patellar glide without crepitus. Patellar and quadriceps tendons unremarkable. Hamstring and quadriceps strength is normal.  Contralateral knee unremarkable  MSK US performed [id number] knee This study was ordered, performed, and interpreted by Charlann Boxer D.O.  Knee: All structures visualized. Patient does have hypoechoic changes around the lateral meniscus with some degenerative changes but no true tear appreciated. Increasing Doppler flow flow noted. Significant feeling different than the contralateral side. Patellar Tendon unremarkable on long and transverse views without effusion. No abnormality of prepatellar bursa. Mild hypoechoic changes around the LCL. Popliteal tendon normal  IMPRESSION:  Acute on chronic degenerative lateral meniscal tear     Impression and Recommendations:     This case required medical decision making of moderate complexity.      Note: This dictation was prepared with Dragon dictation along with smaller phrase technology. Any transcriptional errors that result from this process are unintentional.

## 2015-10-12 NOTE — Assessment & Plan Note (Signed)
Patient does have more of an acute on chronic meniscal tear. Patient has elected try conservative therapy. We discussed over-the-counter bracing, given trial topical anti-inflammatories, icing, we'll discuss possible muscle relaxer if needed. I do think that he will do well with conservative therapy otherwise we'll consider injection at follow-up in 4 weeks. No imaging necessary other than the ultrasound that was done today.

## 2015-10-19 DIAGNOSIS — M6289 Other specified disorders of muscle: Secondary | ICD-10-CM | POA: Diagnosis not present

## 2015-10-19 DIAGNOSIS — Z0000 Encounter for general adult medical examination without abnormal findings: Secondary | ICD-10-CM | POA: Diagnosis not present

## 2015-11-02 ENCOUNTER — Ambulatory Visit (INDEPENDENT_AMBULATORY_CARE_PROVIDER_SITE_OTHER)
Admission: RE | Admit: 2015-11-02 | Discharge: 2015-11-02 | Disposition: A | Discharge status: Home / Self Care | Payer: Medicare Other | Source: Ambulatory Visit | Attending: Family Medicine | Admitting: Family Medicine

## 2015-11-02 ENCOUNTER — Ambulatory Visit (INDEPENDENT_AMBULATORY_CARE_PROVIDER_SITE_OTHER): Payer: Medicare Other | Admitting: Family Medicine

## 2015-11-02 ENCOUNTER — Encounter: Payer: Self-pay | Admitting: Family Medicine

## 2015-11-02 VITALS — BP 102/80 | HR 81 | Ht 67.0 in | Wt 152.0 lb

## 2015-11-02 DIAGNOSIS — M23206 Derangement of unspecified meniscus due to old tear or injury, right knee: Secondary | ICD-10-CM

## 2015-11-02 DIAGNOSIS — M23200 Derangement of unspecified lateral meniscus due to old tear or injury, right knee: Secondary | ICD-10-CM

## 2015-11-02 DIAGNOSIS — M25561 Pain in right knee: Secondary | ICD-10-CM

## 2015-11-02 DIAGNOSIS — M23203 Derangement of unspecified medial meniscus due to old tear or injury, right knee: Secondary | ICD-10-CM

## 2015-11-02 NOTE — Progress Notes (Signed)
[name] [place], [place] [address] Phone: [phone] Subjective:    I'm seeing this patient by the request  of:  Cathlean Cower, MD  CC: right knee pain f/u   [id number] [name] [name] is a 70 y.o. male coming in with complaint of right knee pain. Found to have a lateral meniscal tear. Patient elected to try conservative therapy. Was doing icing regimen, home exercises, topical anti-inflammatories. Patient states Male been doing better for some time but then it is quite had significant amount of pain. States that the pain seems to be worsening. States that having times where until second mainly even moaning go out on him. We'll still avoid any type of surgery. Not taking the medications on a regular basis. Uses the topical medicine occasionally.    Past Medical History  Diagnosis Date  . Old myocardial infarction 1989  . HTN (hypertension)   . HLD (hyperlipidemia)   . CAD (coronary artery disease)   . Hearing loss in right ear   . Atherosclerosis of aorta ([place])   . Inguinal hernia without mention of obstruction or gangrene, unilateral or unspecified, (not specified as recurrent)   . Chronic prostatitis   . BPH (benign prostatic hyperplasia)   . PUD (peptic ulcer disease)   . Diverticulitis   . Allergic rhinitis   . DJD (degenerative joint disease), lumbosacral   . Transient amnesia   . Ocular migraine   . Anxiety 05/01/2011   Past Surgical History  Procedure Laterality Date  . Inguinal hernia repair    . Appendectomy    . Coronary stent placement     Social History   Social History  . Marital Status: Married    Spouse Name: N/A  . Number of Children: 3  . Years of Education: N/A   Occupational History  . retired    Social History Main Topics  . Smoking status: Former Smoker    Quit date: 07/03/1980  . Smokeless tobacco: Never Used  . Alcohol Use: 0.0 oz/week    0 Standard drinks or equivalent per week     Comment: a  few beers or glasses of wine a week prior to dinner  . Drug Use: No  . Sexual Activity: Not Asked   Other Topics Concern  . None   Social History Narrative   Allergies  Allergen Reactions  . Niacin     Hot flashes  . Other Other (See Comments)    Environmental allergies/ post nasal drip  . Penicillins     As child: muscles stiffened   Family History  Problem Relation Age of Onset  . Alcohol abuse Father   . Cancer Father   . Alcohol abuse Brother   . Cancer      grandfather  . Hypertension      grandfather  . Heart disease      grandfather  . Colon cancer Neg Hx   . Stomach cancer Neg Hx     Past medical history, social, surgical and family history all reviewed in electronic medical record.  No pertanent information unless stated regarding to the chief complaint.   Review of Systems: No headache, visual changes, nausea, vomiting, diarrhea, constipation, dizziness, abdominal pain, skin rash, fevers, chills, night sweats, weight loss, swollen lymph nodes, body aches, joint swelling, muscle aches, chest pain, shortness of breath, mood changes.   Objective Blood pressure 102/80, pulse 81, height 5\' 7"  (1.702 m), weight 152 lb (68.947 kg), SpO2 98 %.  General: No apparent distress alert and oriented x3 mood and affect normal, dressed appropriately.  HEENT: Pupils equal, extraocular movements intact  Respiratory: Patient's speak in full sentences and does not appear short of breath  Cardiovascular: No lower extremity edema, non tender, no erythema  Skin: Warm dry intact with no signs of infection or rash on extremities or on axial skeleton.  Abdomen: Soft nontender  Neuro: Cranial nerves II through XII are intact, neurovascularly intact in all extremities with 2+ DTRs and 2+ pulses.  Lymph: No lymphadenopathy of posterior or anterior cervical chain or axillae bilaterally.  Gait normal with good balance and coordination.  MSK:  Non tender with full range of motion and good  stability and symmetric strength and tone of shoulders, elbows, wrist, hip, and ankles bilaterally.  Knee:right knee Normal to inspection with no erythema or effusion or obvious bony abnormalities. Mild discomfort over the lateral joint lineModerate pain on the medial side as well ROM full in flexion and extension and lower leg rotation. Ligaments with solid consistent endpoints including ACL, PCL, LCL, MCL. positiveMcmurray's, Apley's, and Thessalonian tests. Non painful patellar compression. Patellar glide without crepitus. Patellar and quadriceps tendons unremarkable. Hamstring and quadriceps strength is normal.  Contralateral knee unremarkable Worsening exam from previous exam.   After informed written and verbal consent, patient was seated on exam table. Right knee was prepped with alcohol swab and utilizing anterolateral approach, patient's right knee space was injected with 4:1  marcaine 0.5%: Kenalog 40mg /dL. Patient tolerated the procedure well without immediate complications.    Impression and Recommendations:     This case required medical decision making of moderate complexity.      Note: This dictation was prepared with Dragon dictation along with smaller phrase technology. Any transcriptional errors that result from this process are unintentional.

## 2015-11-02 NOTE — Progress Notes (Signed)
Pre visit review using our clinic review tool, if applicable. No additional management support is needed unless otherwise documented below in the visit note. 

## 2015-11-02 NOTE — Patient Instructions (Addendum)
Good to see you  Ice can still be helpful.  Continue the exercises 2-3 times a week  We injected your knee today  Continue the pennsaid Xray downstairs today  Avoid twisting or deep squats.  See me again in 4 weeks to make sure you are doing well.

## 2015-11-02 NOTE — Assessment & Plan Note (Signed)
Patient given injection today. Tolerated the procedure well. X-rays ordered. Patient declined formal physical therapy. We'll continue with conservative therapy. Given a refill of topical anti-inflammatories. Follow-up again in 3-4 weeks.Spent  25 minutes with patient face-to-face and had greater than 50% of counseling including as described above in assessment and plan.

## 2015-11-23 ENCOUNTER — Encounter: Payer: Self-pay | Admitting: Family Medicine

## 2015-11-23 ENCOUNTER — Ambulatory Visit (INDEPENDENT_AMBULATORY_CARE_PROVIDER_SITE_OTHER): Payer: Medicare Other | Admitting: Family Medicine

## 2015-11-23 VITALS — BP 116/82 | HR 71 | Ht 67.0 in | Wt 152.0 lb

## 2015-11-23 DIAGNOSIS — M23206 Derangement of unspecified meniscus due to old tear or injury, right knee: Secondary | ICD-10-CM

## 2015-11-23 DIAGNOSIS — I70209 Unspecified atherosclerosis of native arteries of extremities, unspecified extremity: Secondary | ICD-10-CM | POA: Diagnosis not present

## 2015-11-23 DIAGNOSIS — M23203 Derangement of unspecified medial meniscus due to old tear or injury, right knee: Secondary | ICD-10-CM | POA: Diagnosis not present

## 2015-11-23 DIAGNOSIS — M23200 Derangement of unspecified lateral meniscus due to old tear or injury, right knee: Secondary | ICD-10-CM

## 2015-11-23 NOTE — Patient Instructions (Signed)
Verbal instructions given and patient will follow-up in 6 weeks

## 2015-11-23 NOTE — Assessment & Plan Note (Signed)
Patient seems to be doing very well with conservative therapy. We discussed icing regimen and home exercises. We discussed which activities to do in which ones to avoid. Patient come back and see me again in 6 weeks. Continued have pain.

## 2015-11-23 NOTE — Assessment & Plan Note (Signed)
Calcific changes noted on x-ray. We discussed possible ABI which patient declined. He will discuss with primary care provider as well as cardiologist.

## 2015-11-23 NOTE — Progress Notes (Signed)
Pre visit review using our clinic review tool, if applicable. No additional management support is needed unless otherwise documented below in the visit note. 

## 2015-11-23 NOTE — Progress Notes (Signed)
[name] [place], [place] [address] Phone: [phone] Subjective:    I'm seeing this patient by the request  of:  Cathlean Cower, MD  CC: right knee pain f/u   [id number] [name] [name] is a 70 y.o. male coming in with complaint of right knee pain. Found to have a lateral meniscal tear. Doing better overall. Was given an injection at last exam. States that the catching of the knee is significantly less. Still occasionally happening. No swelling. No giving out on him. Has been very active. No nighttime pain.  Patient had a trace at last exam. X-ray showed minimal osteophytic changes but does have calcific vascularization.    Past Medical History  Diagnosis Date  . Old myocardial infarction 1989  . HTN (hypertension)   . HLD (hyperlipidemia)   . CAD (coronary artery disease)   . Hearing loss in right ear   . Atherosclerosis of aorta ([place])   . Inguinal hernia without mention of obstruction or gangrene, unilateral or unspecified, (not specified as recurrent)   . Chronic prostatitis   . BPH (benign prostatic hyperplasia)   . PUD (peptic ulcer disease)   . Diverticulitis   . Allergic rhinitis   . DJD (degenerative joint disease), lumbosacral   . Transient amnesia   . Ocular migraine   . Anxiety 05/01/2011   Past Surgical History  Procedure Laterality Date  . Inguinal hernia repair    . Appendectomy    . Coronary stent placement     Social History   Social History  . Marital Status: Married    Spouse Name: N/A  . Number of Children: 3  . Years of Education: N/A   Occupational History  . retired    Social History Main Topics  . Smoking status: Former Smoker    Quit date: 07/03/1980  . Smokeless tobacco: Never Used  . Alcohol Use: 0.0 oz/week    0 Standard drinks or equivalent per week     Comment: a few beers or glasses of wine a week prior to dinner  . Drug Use: No  . Sexual Activity: Not Asked   Other Topics Concern    . None   Social History Narrative   Allergies  Allergen Reactions  . Niacin     Hot flashes  . Other Other (See Comments)    Environmental allergies/ post nasal drip  . Penicillins     As child: muscles stiffened   Family History  Problem Relation Age of Onset  . Alcohol abuse Father   . Cancer Father   . Alcohol abuse Brother   . Cancer      grandfather  . Hypertension      grandfather  . Heart disease      grandfather  . Colon cancer Neg Hx   . Stomach cancer Neg Hx     Past medical history, social, surgical and family history all reviewed in electronic medical record.  No pertanent information unless stated regarding to the chief complaint.   Review of Systems: No headache, visual changes, nausea, vomiting, diarrhea, constipation, dizziness, abdominal pain, skin rash, fevers, chills, night sweats, weight loss, swollen lymph nodes, body aches, joint swelling, muscle aches, chest pain, shortness of breath, mood changes.   Objective Blood pressure 116/82, pulse 71, height 5\' 7"  (1.702 m), weight 152 lb (68.947 kg), SpO2 97 %.  General: No apparent distress alert and oriented x3 mood and affect normal, dressed appropriately.  HEENT: Pupils  equal, extraocular movements intact  Respiratory: Patient's speak in full sentences and does not appear short of breath  Cardiovascular: No lower extremity edema, non tender, no erythema  Skin: Warm dry intact with no signs of infection or rash on extremities or on axial skeleton.  Abdomen: Soft nontender  Neuro: Cranial nerves II through XII are intact, neurovascularly intact in all extremities with 2+ DTRs and 2+ pulses.  Lymph: No lymphadenopathy of posterior or anterior cervical chain or axillae bilaterally.  Gait normal with good balance and coordination.  MSK:  Non tender with full range of motion and good stability and symmetric strength and tone of shoulders, elbows, wrist, hip, and ankles bilaterally.  Knee:right knee Normal  to inspection with no erythema or effusion or obvious bony abnormalities. Nontender today and does have full range of motion Ligaments with solid consistent endpoints including ACL, PCL, LCL, MCL. positiveMcmurray's, Apley's, and Thessalonian tests but improved. Non painful patellar compression. Patellar glide without crepitus. Patellar and quadriceps tendons unremarkable. Hamstring and quadriceps strength is normal.  Contralateral knee unremarkable Improvement from previous exam      Impression and Recommendations:     This case required medical decision making of moderate complexity.      Note: This dictation was prepared with Dragon dictation along with smaller phrase technology. Any transcriptional errors that result from this process are unintentional.

## 2015-11-25 DIAGNOSIS — C44311 Basal cell carcinoma of skin of nose: Secondary | ICD-10-CM | POA: Diagnosis not present

## 2015-12-29 ENCOUNTER — Other Ambulatory Visit (INDEPENDENT_AMBULATORY_CARE_PROVIDER_SITE_OTHER): Payer: Medicare Other

## 2015-12-29 ENCOUNTER — Ambulatory Visit (INDEPENDENT_AMBULATORY_CARE_PROVIDER_SITE_OTHER): Payer: Medicare Other | Admitting: Internal Medicine

## 2015-12-29 ENCOUNTER — Encounter: Payer: Self-pay | Admitting: Internal Medicine

## 2015-12-29 VITALS — BP 126/70 | HR 85 | Temp 98.3°F | Resp 20 | Wt 149.0 lb

## 2015-12-29 DIAGNOSIS — E785 Hyperlipidemia, unspecified: Secondary | ICD-10-CM

## 2015-12-29 DIAGNOSIS — I70209 Unspecified atherosclerosis of native arteries of extremities, unspecified extremity: Secondary | ICD-10-CM | POA: Diagnosis not present

## 2015-12-29 DIAGNOSIS — R740 Nonspecific elevation of levels of transaminase and lactic acid dehydrogenase [LDH]: Secondary | ICD-10-CM | POA: Diagnosis not present

## 2015-12-29 DIAGNOSIS — K219 Gastro-esophageal reflux disease without esophagitis: Secondary | ICD-10-CM | POA: Diagnosis not present

## 2015-12-29 DIAGNOSIS — I10 Essential (primary) hypertension: Secondary | ICD-10-CM | POA: Diagnosis not present

## 2015-12-29 DIAGNOSIS — R7401 Elevation of levels of liver transaminase levels: Secondary | ICD-10-CM

## 2015-12-29 LAB — HEPATIC FUNCTION PANEL
ALT: 39 U/L (ref 0–53)
AST: 32 U/L (ref 0–37)
Albumin: 4.4 g/dL (ref 3.5–5.2)
Alkaline Phosphatase: 91 U/L (ref 39–117)
Bilirubin, Direct: 0.1 mg/dL (ref 0.0–0.3)
TOTAL PROTEIN: 7.6 g/dL (ref 6.0–8.3)
Total Bilirubin: 0.8 mg/dL (ref 0.2–1.2)

## 2015-12-29 LAB — IBC PANEL
IRON: 86 ug/dL (ref 42–165)
Saturation Ratios: 23.6 % (ref 20.0–50.0)
TRANSFERRIN: 260 mg/dL (ref 212.0–360.0)

## 2015-12-29 LAB — FERRITIN: Ferritin: 277.7 ng/mL (ref 22.0–322.0)

## 2015-12-29 MED ORDER — PANTOPRAZOLE SODIUM 40 MG PO TBEC: 40.0000 mg | DELAYED_RELEASE_TABLET | Freq: Every day | ORAL | Status: DC

## 2015-12-29 MED ORDER — SIMVASTATIN 20 MG PO TABS: 20.0000 mg | ORAL_TABLET | Freq: Every day | ORAL | Status: DC

## 2015-12-29 NOTE — Progress Notes (Signed)
Pre visit review using our clinic review tool, if applicable. No additional management support is needed unless otherwise documented below in the visit note. 

## 2015-12-29 NOTE — Progress Notes (Signed)
Subjective:    Patient ID: [name] [name], male    DOB: 1946/04/09, 70 y.o.   MRN: [id number]  HPI  Here to f/u blood test results from [place] with documentation adn recommendation to f/u here for any abnormalities;  Did have ALT 89 (normal to 78) and AST to 54 (normal to 36);  Did have lab done about 16 hrs after fathers day celebration with 4 beers.  Has been losing wt intentionally and no hx of fatty liver Wt Readings from Last 3 Encounters:  12/29/15 149 lb (67.586 kg)  11/23/15 152 lb (68.947 kg)  11/02/15 152 lb (68.947 kg)  Only takes asa 81 qd, can take a motrin otc very occas but none recent and no other tylenol use.  Denies worsening reflux, abd pain, dysphagia, n/v, bowel change or blood.  Except had stopped the nexium about 6 mo ago due to cost, but now reflux/gastritis has returned, so asks for alternative.   Gets statin from [place], which only dispenses 40 mg and 80 tabs of zocor, he no longer wants to cut the 40's so asks for 20 mg to local [facility]. Trying to follow lower chol diet Lab Results  Component Value Date   LDLCALC 68 07/21/2015  Pt denies chest pain, increased sob or doe, wheezing, orthopnea, PND, increased LE swelling, palpitations, dizziness or syncope.  Pt denies new neurological symptoms such as new headache, or facial or extremity weakness or numbness   Past Medical History  Diagnosis Date  . Old myocardial infarction 1989  . HTN (hypertension)   . HLD (hyperlipidemia)   . CAD (coronary artery disease)   . Hearing loss in right ear   . Atherosclerosis of aorta ([place])   . Inguinal hernia without mention of obstruction or gangrene, unilateral or unspecified, (not specified as recurrent)   . Chronic prostatitis   . BPH (benign prostatic hyperplasia)   . PUD (peptic ulcer disease)   . Diverticulitis   . Allergic rhinitis   . DJD (degenerative joint disease), lumbosacral   . Transient amnesia   . Ocular migraine   . Anxiety 05/01/2011   Past Surgical History    Procedure Laterality Date  . Inguinal hernia repair    . Appendectomy    . Coronary stent placement      reports that he quit smoking about 35 years ago. He has never used smokeless tobacco. He reports that he drinks alcohol. He reports that he does not use illicit drugs. family history includes Alcohol abuse in his brother and father; Cancer in his father. There is no history of Colon cancer or Stomach cancer. Allergies  Allergen Reactions  . Niacin     Hot flashes  . Other Other (See Comments)    Environmental allergies/ post nasal drip  . Penicillins     As child: muscles stiffened   Current Outpatient Prescriptions on File Prior to Visit  Medication Sig Dispense Refill  . aspirin 81 MG tablet Take 81 mg by mouth daily.    . fluticasone (FLONASE) 50 MCG/ACT nasal spray Place 2 sprays into the nose daily as needed.    . gabapentin (NEURONTIN) 100 MG capsule Take 2 capsules (200 mg total) by mouth at bedtime. 180 capsule 3  . Melatonin 10 MG TABS Take by mouth.    . montelukast (SINGULAIR) 10 MG tablet Take 1 tablet (10 mg total) by mouth daily. 30 tablet 11   No current facility-administered medications on file prior to visit.   Review of  Systems  Constitutional: Negative for unusual diaphoresis or night sweats HENT: Negative for ear swelling or discharge Eyes: Negative for worsening visual haziness  Respiratory: Negative for choking and stridor.   Gastrointestinal: Negative for distension or worsening eructation Genitourinary: Negative for retention or change in urine volume.  Musculoskeletal: Negative for other MSK pain or swelling Skin: Negative for color change and worsening wound Neurological: Negative for tremors and numbness other than noted  Psychiatric/Behavioral: Negative for decreased concentration or agitation other than above       Objective:   Physical Exam BP 126/70 mmHg  Pulse 85  Temp(Src) 98.3 F (36.8 C) (Oral)  Resp 20  Wt 149 lb (67.586 kg)   SpO2 97% VS noted,  Constitutional: Pt appears in no apparent distress HENT: Head: NCAT.  Right Ear: External ear normal.  Left Ear: External ear normal.  Eyes: . Pupils are equal, round, and reactive to light. Conjunctivae and EOM are normal Neck: Normal range of motion. Neck supple.  Cardiovascular: Normal rate and regular rhythm.   Pulmonary/Chest: Effort normal and breath sounds without rales or wheezing.  Abd:  Soft, NT, ND, + BS Neurological: Pt is alert. Not confused , motor grossly intact Skin: Skin is warm. No rash, no LE edema Psychiatric: Pt behavior is normal. No agitation.      Assessment & Plan:

## 2015-12-29 NOTE — Patient Instructions (Signed)
Ok to stop the nexium  Please take all new medication as prescribed  - the protonix 40 mg per day  Please continue all other medications as before, including the zocor  Please have the pharmacy call with any other refills you may need.  Please continue your efforts at being more active, low cholesterol diet, and weight control.  Please keep your appointments with your specialists as you may have planned  Please go to the LAB in the Basement (turn left off the elevator) for the tests to be done today  You will be contacted by phone if any changes need to be made immediately.  Otherwise, you will receive a letter about your results with an explanation, but please check with MyChart first.  If the liver tests are still abnormal, we may need to check an Abdomen ultrasound, or even recommend a GI referral  Please remember to sign up for MyChart if you have not done so, as this will be important to you in the future with finding out test results, communicating by private email, and scheduling acute appointments online when needed.  Please return in 6 months, or sooner if needed

## 2015-12-30 ENCOUNTER — Encounter: Payer: Self-pay | Admitting: Internal Medicine

## 2015-12-30 LAB — HEPATITIS PANEL, ACUTE
HCV AB: NEGATIVE
HEP A IGM: NONREACTIVE
HEP B C IGM: NONREACTIVE
Hepatitis B Surface Ag: NEGATIVE

## 2015-12-30 MED ORDER — ATORVASTATIN CALCIUM 20 MG PO TABS: 20.0000 mg | ORAL_TABLET | Freq: Every day | ORAL | Status: DC

## 2015-12-31 LAB — ANA: Anti Nuclear Antibody(ANA): NEGATIVE

## 2015-12-31 NOTE — Assessment & Plan Note (Signed)
stable overall by history and exam, recent data reviewed with pt, and pt to continue medical treatment as before,  to f/u any worsening symptoms or concerns BP Readings from Last 3 Encounters:  12/29/15 126/70  11/23/15 116/82  11/02/15 102/80

## 2015-12-31 NOTE — Assessment & Plan Note (Signed)
stable overall by history and exam, and pt to continue medical treatment as before,  to f/u any worsening symptoms or concerns 

## 2015-12-31 NOTE — Assessment & Plan Note (Signed)
Lab Results  Component Value Date   LDLCALC 68 07/21/2015   stable overall by history and exam, recent data reviewed with pt, and pt to continue medical treatment as before,  to f/u any worsening symptoms or concerns

## 2015-12-31 NOTE — Assessment & Plan Note (Signed)
No prior hx, will need repeat labs today, heptiatis panel, consider GI referral

## 2016-01-18 ENCOUNTER — Ambulatory Visit: Payer: Medicare Other | Admitting: Family Medicine

## 2016-02-15 DIAGNOSIS — I2510 Atherosclerotic heart disease of native coronary artery without angina pectoris: Secondary | ICD-10-CM | POA: Diagnosis not present

## 2016-02-15 DIAGNOSIS — Z6823 Body mass index (BMI) 23.0-23.9, adult: Secondary | ICD-10-CM | POA: Diagnosis not present

## 2016-02-15 DIAGNOSIS — I739 Peripheral vascular disease, unspecified: Secondary | ICD-10-CM | POA: Diagnosis not present

## 2016-02-15 DIAGNOSIS — E785 Hyperlipidemia, unspecified: Secondary | ICD-10-CM | POA: Diagnosis not present

## 2016-02-15 DIAGNOSIS — I252 Old myocardial infarction: Secondary | ICD-10-CM | POA: Diagnosis not present

## 2016-02-29 DIAGNOSIS — Z23 Encounter for immunization: Secondary | ICD-10-CM | POA: Diagnosis not present

## 2016-03-07 DIAGNOSIS — L905 Scar conditions and fibrosis of skin: Secondary | ICD-10-CM | POA: Diagnosis not present

## 2016-03-07 DIAGNOSIS — Z08 Encounter for follow-up examination after completed treatment for malignant neoplasm: Secondary | ICD-10-CM | POA: Diagnosis not present

## 2016-03-07 DIAGNOSIS — L570 Actinic keratosis: Secondary | ICD-10-CM | POA: Diagnosis not present

## 2016-03-07 DIAGNOSIS — L578 Other skin changes due to chronic exposure to nonionizing radiation: Secondary | ICD-10-CM | POA: Diagnosis not present

## 2016-03-07 DIAGNOSIS — Z85828 Personal history of other malignant neoplasm of skin: Secondary | ICD-10-CM | POA: Diagnosis not present

## 2016-06-16 ENCOUNTER — Other Ambulatory Visit: Payer: Self-pay | Admitting: Family Medicine

## 2016-06-16 NOTE — Telephone Encounter (Signed)
Refill done.  

## 2016-06-22 ENCOUNTER — Other Ambulatory Visit: Payer: Self-pay | Admitting: Internal Medicine

## 2016-06-29 ENCOUNTER — Ambulatory Visit (INDEPENDENT_AMBULATORY_CARE_PROVIDER_SITE_OTHER): Payer: Medicare Other | Admitting: Internal Medicine

## 2016-06-29 ENCOUNTER — Ambulatory Visit (INDEPENDENT_AMBULATORY_CARE_PROVIDER_SITE_OTHER)
Admission: RE | Admit: 2016-06-29 | Discharge: 2016-06-29 | Disposition: A | Discharge status: Home / Self Care | Payer: Medicare Other | Source: Ambulatory Visit | Attending: Internal Medicine | Admitting: Internal Medicine

## 2016-06-29 ENCOUNTER — Encounter: Payer: Self-pay | Admitting: Internal Medicine

## 2016-06-29 VITALS — BP 160/98 | HR 85 | Temp 97.8°F | Resp 12 | Ht 68.0 in | Wt 154.0 lb

## 2016-06-29 DIAGNOSIS — I70209 Unspecified atherosclerosis of native arteries of extremities, unspecified extremity: Secondary | ICD-10-CM | POA: Diagnosis not present

## 2016-06-29 DIAGNOSIS — I10 Essential (primary) hypertension: Secondary | ICD-10-CM

## 2016-06-29 DIAGNOSIS — K219 Gastro-esophageal reflux disease without esophagitis: Secondary | ICD-10-CM

## 2016-06-29 DIAGNOSIS — R079 Chest pain, unspecified: Secondary | ICD-10-CM | POA: Diagnosis not present

## 2016-06-29 DIAGNOSIS — R0789 Other chest pain: Secondary | ICD-10-CM | POA: Diagnosis not present

## 2016-06-29 MED ORDER — RANITIDINE HCL 150 MG PO TABS: 150.0000 mg | ORAL_TABLET | Freq: Two times a day (BID) | ORAL | 11 refills | Status: DC

## 2016-06-29 NOTE — Progress Notes (Signed)
Subjective:    Patient ID: [name] [name], male    DOB: Nov 09, 1945, 70 y.o.   MRN: [id number]  HPI    Here to f/u with GI symptoms, had a long drive before thanksgiving, felt some indigestion occur after hours of sitting while driving, overall mild, but now getting worse by the day, seems to happen more with sitting at the computer, walking and excercising on treadmill daily does not make worse; belching sometimes makes better. Pt denies chest pain, increased sob or doe, wheezing, orthopnea, PND, increased LE swelling, palpitations, dizziness or syncope. Pt denies new neurological symptoms such as new headache, or facial or extremity weakness or numbness  Pt denies polydipsia, polyuria,.Denies worsening abd pain, dysphagia, n/v, bowel change or blood., and near daily citrucel works well for diarrhea./hx of IBS.   Has been on nexium in past but when too expensive changed to protonix, takes dialy with good compliance, did take some nexium recently and seemed better at first, then not the last few days.  Wife is concerned PPI can cause dementia so he is reluctant to take.  Denies worsening depressive symptoms, suicidal ideation, or panic; has ongoing anxiety.  Wife doing well with zantac.  EGD normal in 2014.  Last stress test about 6 yrs ago after MI and stent neg per pt. BP at home most often about 140. Past Medical History:  Diagnosis Date  . Allergic rhinitis   . Anxiety 05/01/2011  . Atherosclerosis of aorta ([place])   . BPH (benign prostatic hyperplasia)   . CAD (coronary artery disease)   . Chronic prostatitis   . Diverticulitis   . DJD (degenerative joint disease), lumbosacral   . Hearing loss in right ear   . HLD (hyperlipidemia)   . HTN (hypertension)   . Inguinal hernia without mention of obstruction or gangrene, unilateral or unspecified, (not specified as recurrent)   . Ocular migraine   . Old myocardial infarction 1989  . PUD (peptic ulcer disease)   . Transient amnesia    Past Surgical  History:  Procedure Laterality Date  . APPENDECTOMY    . CORONARY STENT PLACEMENT    . INGUINAL HERNIA REPAIR      reports that he quit smoking about 36 years ago. He has never used smokeless tobacco. He reports that he drinks alcohol. He reports that he does not use drugs. family history includes Alcohol abuse in his brother and father; Cancer in his father. Allergies  Allergen Reactions  . Niacin     Hot flashes  . Other Other (See Comments)    Environmental allergies/ post nasal drip  . Penicillins     As child: muscles stiffened   Current Outpatient Prescriptions on File Prior to Visit  Medication Sig Dispense Refill  . aspirin 81 MG tablet Take 81 mg by mouth daily.    Marland Kitchen atorvastatin (LIPITOR) 20 MG tablet Take 1 tablet (20 mg total) by mouth daily. 90 tablet 3  . fluticasone (FLONASE) 50 MCG/ACT nasal spray Place 2 sprays into the nose daily as needed.    . gabapentin (NEURONTIN) 100 MG capsule TAKE 2 CAPSULES(200 MG) BY MOUTH AT BEDTIME 180 capsule 0  . montelukast (SINGULAIR) 10 MG tablet TAKE 1 TABLET(10 MG) BY MOUTH DAILY 30 tablet 0  . Melatonin 10 MG TABS Take by mouth.    . pantoprazole (PROTONIX) 40 MG tablet Take 1 tablet (40 mg total) by mouth daily. (Patient not taking: Reported on 06/29/2016) 90 tablet 3   No current  facility-administered medications on file prior to visit.    Review of Systems  Constitutional: Negative for unusual diaphoresis or night sweats HENT: Negative for ear swelling or discharge Eyes: Negative for worsening visual haziness  Respiratory: Negative for choking and stridor.   Gastrointestinal: Negative for distension or worsening eructation Genitourinary: Negative for retention or change in urine volume.  Musculoskeletal: Negative for other MSK pain or swelling Skin: Negative for color change and worsening wound Neurological: Negative for tremors and numbness other than noted  Psychiatric/Behavioral: Negative for decreased concentration or  agitation other than above   All other system neg per pt    Objective:   Physical Exam BP (!) 160/98   Pulse 85   Temp 97.8 F (36.6 C) (Oral)   Resp 12   Ht 5\' 8"  (1.727 m)   Wt 154 lb (69.9 kg)   SpO2 98%   BMI 23.42 kg/m  VS noted, not ill appearing Constitutional: Pt appears in no apparent distress HENT: Head: NCAT.  Right Ear: External ear normal.  Left Ear: External ear normal.  Eyes: . Pupils are equal, round, and reactive to light. Conjunctivae and EOM are normal Neck: Normal range of motion. Neck supple.  Cardiovascular: Normal rate and regular rhythm.   Pulmonary/Chest: Effort normal and breath sounds without rales or wheezing.  Abd:  Soft, NT, ND, + BS Neurological: Pt is alert. Not confused , motor grossly intact Skin: Skin is warm. No rash, no LE edema Psychiatric: Pt behavior is normal. No agitation.  No other new exam findings  ECG today I have personally interpreted: Sinus  Rhythm  -Old inferior-apical infarct.     Assessment & Plan:

## 2016-06-29 NOTE — Assessment & Plan Note (Signed)
Mild reactive likely anxiety related today, cont same tx, cont to monitor at home

## 2016-06-29 NOTE — Assessment & Plan Note (Signed)
Ok for change PPI to zantac 150 bid,  to f/u any worsening symptoms or concerns

## 2016-06-29 NOTE — Assessment & Plan Note (Signed)
Etiology likely GI vs other, cant r/o cardiac, ecg reviewed, Has [facility] cardiologist but is ok with stress test and f/u with that cardiologist if needed

## 2016-06-29 NOTE — Progress Notes (Signed)
Pre visit review using our clinic review tool, if applicable. No additional management support is needed unless otherwise documented below in the visit note. 

## 2016-06-29 NOTE — Patient Instructions (Addendum)
Your EKG was OK today  You will be contacted regarding the referral for: stress test  Please take all new medication as prescribed - the zantac 150 mg twice per day (and ok to stop the protonix and nexium)  Please continue all other medications as before, and refills have been done if requested.  Please have the pharmacy call with any other refills you may need.  Please continue your efforts at being more active, low cholesterol diet, and weight control.  Please keep your appointments with your specialists as you may have planned  Please go to the XRAY Department in the Basement (go straight as you get off the elevator) for the x-ray testing  You will be contacted by phone if any changes need to be made immediately.  Otherwise, you will receive a letter about your results with an explanation, but please check with MyChart first.  Please remember to sign up for MyChart if you have not done so, as this will be important to you in the future with finding out test results, communicating by private email, and scheduling acute appointments online when needed.  Please return in Jan 2018 for yearly checkup

## 2016-07-05 ENCOUNTER — Encounter: Payer: Self-pay | Admitting: Internal Medicine

## 2016-07-06 ENCOUNTER — Telehealth (HOSPITAL_COMMUNITY): Payer: Self-pay | Admitting: *Deleted

## 2016-07-06 NOTE — Telephone Encounter (Signed)
Patient given detailed instructions per Myocardial Perfusion Study Information Sheet for the test on 07/10/16 at 0945. Patient notified to arrive 15 minutes early and that it is imperative to arrive on time for appointment to keep from having the test rescheduled.  If you need to cancel or reschedule your appointment, please call the office within 24 hours of your appointment. Failure to do so may result in a cancellation of your appointment, and a $50 no show fee. Patient verbalized understanding.Stanislaus Kaltenbach, [place]

## 2016-07-10 ENCOUNTER — Ambulatory Visit (HOSPITAL_COMMUNITY): Payer: Medicare Other | Attending: Cardiology

## 2016-07-10 DIAGNOSIS — I739 Peripheral vascular disease, unspecified: Secondary | ICD-10-CM | POA: Insufficient documentation

## 2016-07-10 DIAGNOSIS — I10 Essential (primary) hypertension: Secondary | ICD-10-CM | POA: Insufficient documentation

## 2016-07-10 DIAGNOSIS — R079 Chest pain, unspecified: Secondary | ICD-10-CM | POA: Diagnosis not present

## 2016-07-10 DIAGNOSIS — I2510 Atherosclerotic heart disease of native coronary artery without angina pectoris: Secondary | ICD-10-CM | POA: Diagnosis not present

## 2016-07-10 DIAGNOSIS — R9439 Abnormal result of other cardiovascular function study: Secondary | ICD-10-CM | POA: Insufficient documentation

## 2016-07-10 LAB — MYOCARDIAL PERFUSION IMAGING
CHL CUP NUCLEAR SDS: 2
CHL CUP RESTING HR STRESS: 83 {beats}/min
CSEPPHR: 139 {beats}/min
Estimated workload: 10.5 METS
Exercise duration (min): 9 min
Exercise duration (sec): 15 s
LHR: 0.32
LVDIAVOL: 99 mL (ref 62–150)
LVSYSVOL: 44 mL
MPHR: 148 {beats}/min
Percent HR: 93 %
RPE: 18
SRS: 9
SSS: 11
TID: 0.92

## 2016-07-10 MED ORDER — TECHNETIUM TC 99M TETROFOSMIN IV KIT
32.3000 | PACK | Freq: Once | INTRAVENOUS | Status: AC | PRN
  Administered 2016-07-10: 32.3 via INTRAVENOUS
  Filled 2016-07-10: qty 33

## 2016-07-10 MED ORDER — TECHNETIUM TC 99M TETROFOSMIN IV KIT
10.4000 | PACK | Freq: Once | INTRAVENOUS | Status: AC | PRN
  Administered 2016-07-10: 10.4 via INTRAVENOUS
  Filled 2016-07-10: qty 11

## 2016-07-20 ENCOUNTER — Other Ambulatory Visit: Payer: Self-pay | Admitting: Internal Medicine

## 2016-08-15 ENCOUNTER — Encounter: Payer: Medicare Other | Admitting: Internal Medicine

## 2016-08-18 ENCOUNTER — Other Ambulatory Visit: Payer: Self-pay | Admitting: Internal Medicine

## 2016-09-05 DIAGNOSIS — L814 Other melanin hyperpigmentation: Secondary | ICD-10-CM | POA: Diagnosis not present

## 2016-09-05 DIAGNOSIS — Z85828 Personal history of other malignant neoplasm of skin: Secondary | ICD-10-CM | POA: Diagnosis not present

## 2016-09-05 DIAGNOSIS — L821 Other seborrheic keratosis: Secondary | ICD-10-CM | POA: Diagnosis not present

## 2016-09-05 DIAGNOSIS — L570 Actinic keratosis: Secondary | ICD-10-CM | POA: Diagnosis not present

## 2016-09-05 DIAGNOSIS — Z08 Encounter for follow-up examination after completed treatment for malignant neoplasm: Secondary | ICD-10-CM | POA: Diagnosis not present

## 2016-09-12 ENCOUNTER — Other Ambulatory Visit: Payer: Self-pay | Admitting: Family Medicine

## 2016-09-16 ENCOUNTER — Other Ambulatory Visit: Payer: Self-pay | Admitting: Internal Medicine

## 2016-09-19 ENCOUNTER — Other Ambulatory Visit: Payer: Self-pay

## 2016-09-19 MED ORDER — MONTELUKAST SODIUM 10 MG PO TABS: ORAL_TABLET | ORAL | 0 refills | Status: DC

## 2016-09-28 ENCOUNTER — Encounter: Payer: Self-pay | Admitting: Internal Medicine

## 2016-09-28 ENCOUNTER — Other Ambulatory Visit (INDEPENDENT_AMBULATORY_CARE_PROVIDER_SITE_OTHER): Payer: Medicare Other

## 2016-09-28 ENCOUNTER — Ambulatory Visit (INDEPENDENT_AMBULATORY_CARE_PROVIDER_SITE_OTHER): Payer: Medicare Other | Admitting: Internal Medicine

## 2016-09-28 VITALS — BP 130/78 | HR 71 | Temp 98.0°F | Ht 67.0 in | Wt 155.0 lb

## 2016-09-28 DIAGNOSIS — E785 Hyperlipidemia, unspecified: Secondary | ICD-10-CM | POA: Diagnosis not present

## 2016-09-28 DIAGNOSIS — G4700 Insomnia, unspecified: Secondary | ICD-10-CM | POA: Diagnosis not present

## 2016-09-28 DIAGNOSIS — N320 Bladder-neck obstruction: Secondary | ICD-10-CM | POA: Diagnosis not present

## 2016-09-28 DIAGNOSIS — I10 Essential (primary) hypertension: Secondary | ICD-10-CM

## 2016-09-28 LAB — URINALYSIS, ROUTINE W REFLEX MICROSCOPIC
BILIRUBIN URINE: NEGATIVE
Hgb urine dipstick: NEGATIVE
KETONES UR: NEGATIVE
LEUKOCYTES UA: NEGATIVE
Nitrite: NEGATIVE
PH: 5.5 (ref 5.0–8.0)
RBC / HPF: NONE SEEN (ref 0–?)
SPECIFIC GRAVITY, URINE: 1.01 (ref 1.000–1.030)
Total Protein, Urine: NEGATIVE
URINE GLUCOSE: NEGATIVE
UROBILINOGEN UA: 0.2 (ref 0.0–1.0)

## 2016-09-28 LAB — CBC WITH DIFFERENTIAL/PLATELET
BASOS ABS: 0 10*3/uL (ref 0.0–0.1)
Basophils Relative: 0.6 % (ref 0.0–3.0)
EOS ABS: 0 10*3/uL (ref 0.0–0.7)
Eosinophils Relative: 0.7 % (ref 0.0–5.0)
HEMATOCRIT: 42.2 % (ref 39.0–52.0)
Hemoglobin: 14.6 g/dL (ref 13.0–17.0)
LYMPHS ABS: 1.2 10*3/uL (ref 0.7–4.0)
Lymphocytes Relative: 22.5 % (ref 12.0–46.0)
MCHC: 34.6 g/dL (ref 30.0–36.0)
MCV: 92.7 fl (ref 78.0–100.0)
Monocytes Absolute: 0.6 10*3/uL (ref 0.1–1.0)
Monocytes Relative: 11.7 % (ref 3.0–12.0)
Neutro Abs: 3.6 10*3/uL (ref 1.4–7.7)
Neutrophils Relative %: 64.5 % (ref 43.0–77.0)
PLATELETS: 175 10*3/uL (ref 150.0–400.0)
RBC: 4.54 Mil/uL (ref 4.22–5.81)
RDW: 12.8 % (ref 11.5–15.5)
WBC: 5.5 10*3/uL (ref 4.0–10.5)

## 2016-09-28 LAB — LIPID PANEL
CHOLESTEROL: 143 mg/dL (ref 0–200)
HDL: 42.1 mg/dL (ref >39.00)
LDL Cholesterol: 74 mg/dL (ref 0–99)
NonHDL: 100.55
Total CHOL/HDL Ratio: 3
Triglycerides: 135 mg/dL (ref 0.0–149.0)
VLDL: 27 mg/dL (ref 0.0–40.0)

## 2016-09-28 LAB — HEPATIC FUNCTION PANEL
ALBUMIN: 4.1 g/dL (ref 3.5–5.2)
ALK PHOS: 66 U/L (ref 39–117)
ALT: 23 U/L (ref 0–53)
AST: 25 U/L (ref 0–37)
Bilirubin, Direct: 0.1 mg/dL (ref 0.0–0.3)
TOTAL PROTEIN: 6.6 g/dL (ref 6.0–8.3)
Total Bilirubin: 0.6 mg/dL (ref 0.2–1.2)

## 2016-09-28 LAB — BASIC METABOLIC PANEL
BUN: 16 mg/dL (ref 6–23)
CALCIUM: 9.4 mg/dL (ref 8.4–10.5)
CO2: 30 meq/L (ref 19–32)
Chloride: 105 mEq/L (ref 96–112)
Creatinine, Ser: 1.1 mg/dL (ref 0.40–1.50)
GFR: 69.83 mL/min (ref >60.00)
GLUCOSE: 120 mg/dL — AB (ref 70–99)
POTASSIUM: 4.6 meq/L (ref 3.5–5.1)
SODIUM: 141 meq/L (ref 135–145)

## 2016-09-28 LAB — PSA: PSA: 1.14 ng/mL (ref 0.10–4.00)

## 2016-09-28 LAB — TSH: TSH: 2.11 u[IU]/mL (ref 0.35–4.50)

## 2016-09-28 MED ORDER — TIZANIDINE HCL 4 MG PO TABS: 4.0000 mg | ORAL_TABLET | Freq: Every evening | ORAL | 3 refills | Status: DC | PRN

## 2016-09-28 NOTE — Assessment & Plan Note (Addendum)
stable overall by history and exam, recent data reviewed with pt, and pt to continue medical treatment as before,  to f/u any worsening symptoms or concerns Lab Results  Component Value Date   LDLCALC 68 07/21/2015   For f/u lab today

## 2016-09-28 NOTE — Progress Notes (Signed)
Pre visit review using our clinic review tool, if applicable. No additional management support is needed unless otherwise documented below in the visit note. 

## 2016-09-28 NOTE — Assessment & Plan Note (Signed)
asympt, also for psa as he is due

## 2016-09-28 NOTE — Progress Notes (Signed)
Subjective:    Patient ID: [name] [name], male    DOB: [birth year], 71 y.o.   MRN: [id number]  HPI    Here for yearly f/u;  Overall doing ok;  Pt denies Chest pain, worsening SOB, DOE, wheezing, orthopnea, PND, worsening LE edema, palpitations, dizziness or syncope.  Pt denies neurological change such as new headache, facial or extremity weakness.  Pt denies polydipsia, polyuria, or low sugar symptoms. Pt states overall good compliance with treatment and medications, good tolerability, and has been trying to follow appropriate diet.  Pt denies worsening depressive symptoms, suicidal ideation or panic. No fever, night sweats, wt loss, loss of appetite, or other constitutional symptoms.  Pt states good ability with ADL's, has low fall risk, home safety reviewed and adequate, no other significant changes in hearing or vision, and only occasionally active with exercise. Zantac bid working well for reflux.  Denies worsening reflux, abd pain, dysphagia, n/v, bowel change or blood. Does have diffinculty getting to sleep, wife takes temazepam, wondering about this, but has had experience with zanaflex with LBP and this has worked well for sleep recently 4 nights.    Past Medical History:  Diagnosis Date  . Allergic rhinitis   . Anxiety 05/01/2011  . Atherosclerosis of aorta ([place])   . BPH (benign prostatic hyperplasia)   . CAD (coronary artery disease)   . Chronic prostatitis   . Diverticulitis   . DJD (degenerative joint disease), lumbosacral   . Hearing loss in right ear   . HLD (hyperlipidemia)   . HTN (hypertension)   . Inguinal hernia without mention of obstruction or gangrene, unilateral or unspecified, (not specified as recurrent)   . Ocular migraine   . Old myocardial infarction 1989  . PUD (peptic ulcer disease)   . Transient amnesia    Past Surgical History:  Procedure Laterality Date  . APPENDECTOMY    . CORONARY STENT PLACEMENT    . INGUINAL HERNIA REPAIR      reports that he quit  smoking about 36 years ago. He has never used smokeless tobacco. He reports that he drinks alcohol. He reports that he does not use drugs. family history includes Alcohol abuse in his brother and father; Cancer in his father. Allergies  Allergen Reactions  . Niacin     Hot flashes  . Other Other (See Comments)    Environmental allergies/ post nasal drip  . Penicillins     As child: muscles stiffened   Current Outpatient Prescriptions on File Prior to Visit  Medication Sig Dispense Refill  . aspirin 81 MG tablet Take 81 mg by mouth daily.    Marland Kitchen atorvastatin (LIPITOR) 20 MG tablet Take 1 tablet (20 mg total) by mouth daily. 90 tablet 3  . fluticasone (FLONASE) 50 MCG/ACT nasal spray Place 2 sprays into the nose daily as needed.    . gabapentin (NEURONTIN) 100 MG capsule TAKE 2 CAPSULES(200 MG) BY MOUTH AT BEDTIME 180 capsule 0  . Melatonin 10 MG TABS Take by mouth.    . montelukast (SINGULAIR) 10 MG tablet TAKE 1 TABLET(10 MG) BY MOUTH DAILY 30 tablet 0  . ranitidine (ZANTAC) 150 MG tablet Take 1 tablet (150 mg total) by mouth 2 (two) times daily. 60 tablet 11   No current facility-administered medications on file prior to visit.    Review of Systems Constitutional: Negative for increased diaphoresis, or other activity, appetite or siginficant weight change other than noted HENT: Negative for worsening hearing loss, ear pain, facial swelling,  mouth sores and neck stiffness.   Eyes: Negative for other worsening pain, redness or visual disturbance.  Respiratory: Negative for choking or stridor Cardiovascular: Negative for other chest pain and palpitations.  Gastrointestinal: Negative for worsening diarrer hea, blood in stool, or abdominal distention Genitourinary: Negative for hematuria, flank pain or change in urine volume.  Musculoskeletal: Negative for myalgias or other joint complaints.  Skin: Negative for other color change and wound or drainage.  Neurological: Negative for syncope  and numbness. other than noted Hematological: Negative for adenopathy. or other swelling Psychiatric/Behavioral: Negative for hallucinations, SI, self-injury, decreased concentration or other worsening agitation.  All other system neg pe rpt    Objective:   Physical Exam BP 130/78   Pulse 71   Temp 98 F (36.7 C) (Oral)   Ht 5\' 7"  (1.702 m)   Wt 155 lb (70.3 kg)   SpO2 99%   BMI 24.28 kg/m  VS noted, not ill appearing Constitutional: Pt is oriented to person, place, and time. Appears well-developed and well-nourished, in no significant distress Head: Normocephalic and atraumatic  Eyes: Conjunctivae and EOM are normal. Pupils are equal, round, and reactive to light Right Ear: External ear normal.  Left Ear: External ear normal Nose: Nose normal.  Mouth/Throat: Oropharynx is clear and moist  Neck: Normal range of motion. Neck supple. No JVD present. No tracheal deviation present or significant neck LA or mass Cardiovascular: Normal rate, regular rhythm, normal heart sounds and intact distal pulses.   Pulmonary/Chest: Effort normal and breath sounds without rales or wheezing  Abdominal: Soft. Bowel sounds are normal. NT. No HSM  Musculoskeletal: Normal range of motion. Exhibits no edema Lymphadenopathy: Has no cervical adenopathy.  Neurological: Pt is alert and oriented to person, place, and time. Pt has normal reflexes. No cranial nerve deficit. Motor grossly intact Skin: Skin is warm and dry. No rash noted or new ulcers Psychiatric:  Has mild nervous mood and affect. Behavior is normal.  No other exam findings    Assessment & Plan:

## 2016-09-28 NOTE — Assessment & Plan Note (Signed)
stable overall by history and exam, recent data reviewed with pt, and pt to continue medical treatment as before,  to f/u any worsening symptoms or concerns BP Readings from Last 3 Encounters:  09/28/16 130/78  06/29/16 (!) 160/98  12/29/15 126/70

## 2016-09-28 NOTE — Patient Instructions (Signed)
Please take all new medication as prescribed - the muscle relaxer at night  Please continue all other medications as before, and refills have been done if requested.  Please have the pharmacy call with any other refills you may need.  Please continue your efforts at being more active, low cholesterol diet, and weight control.  You are otherwise up to date with prevention measures today.  Please keep your appointments with your specialists as you may have planned  Please go to the LAB in the Basement (turn left off the elevator) for the tests to be done today  You will be contacted by phone if any changes need to be made immediately.  Otherwise, you will receive a letter about your results with an explanation, but please check with MyChart first.  Please remember to sign up for MyChart if you have not done so, as this will be important to you in the future with finding out test results, communicating by private email, and scheduling acute appointments online when needed.  Please return in 1 year for your yearly visit, or sooner if needed

## 2016-09-28 NOTE — Assessment & Plan Note (Signed)
Ok for zanaflex qhs as this happens to work well and low risk,  to f/u any worsening symptoms or concerns

## 2016-09-29 DIAGNOSIS — B0233 Zoster keratitis: Secondary | ICD-10-CM | POA: Insufficient documentation

## 2016-10-03 DIAGNOSIS — B0233 Zoster keratitis: Secondary | ICD-10-CM | POA: Diagnosis not present

## 2016-10-17 NOTE — Progress Notes (Signed)
[name] [address], [place] [address] Phone: [phone] Subjective:    I'm seeing this patient by the request  of:    CC: Paul muscle and back  [id number]  [name] [name] is a 71 y.o. male coming in with complaint of told muscle in his back. Patient states To to have low back left side pain. Has had this for years. We have seen him previously. Has been taking gabapentin in the muscle relaxer. States that this pain seems to be worsening. Increasing intermittent pain going down the left leg. No weakness. States that he just doing it back at night. Tries to stay active.     Past Medical History:  Diagnosis Date  . Allergic rhinitis   . Anxiety 05/01/2011  . Atherosclerosis of aorta ([place])   . BPH (benign prostatic hyperplasia)   . CAD (coronary artery disease)   . Chronic prostatitis   . Diverticulitis   . DJD (degenerative joint disease), lumbosacral   . Hearing loss in right ear   . HLD (hyperlipidemia)   . HTN (hypertension)   . Inguinal hernia without mention of obstruction or gangrene, unilateral or unspecified, (not specified as recurrent)   . Ocular migraine   . Old myocardial infarction 1989  . PUD (peptic ulcer disease)   . Transient amnesia    Past Surgical History:  Procedure Laterality Date  . APPENDECTOMY    . CORONARY STENT PLACEMENT    . INGUINAL HERNIA REPAIR     Social History   Social History  . Marital status: Married    Spouse name: N/A  . Number of children: 3  . Years of education: N/A   Occupational History  . retired    Social History Main Topics  . Smoking status: Former Smoker    Quit date: 07/03/1980  . Smokeless tobacco: Never Used  . Alcohol use 0.0 oz/week     Comment: a few beers or glasses of wine a week prior to dinner  . Drug use: No  . Sexual activity: Not Asked   Other Topics Concern  . None   Social History Narrative  . None   Allergies  Allergen Reactions  . Niacin     Hot  flashes  . Other Other (See Comments)    Environmental allergies/ post nasal drip  . Penicillins     As child: muscles stiffened   Family History  Problem Relation Age of Onset  . Alcohol abuse Father   . Cancer Father   . Alcohol abuse Brother   . Cancer      grandfather  . Hypertension      grandfather  . Heart disease      grandfather  . Colon cancer Neg Hx   . Stomach cancer Neg Hx     Past medical history, social, surgical and family history all reviewed in electronic medical record.  No pertanent information unless stated regarding to the chief complaint.   Review of Systems:Review of systems updated and as accurate as of 10/18/16  No headache, visual changes, nausea, vomiting, diarrhea, constipation, dizziness, abdominal pain, skin rash, fevers, chills, night sweats, weight loss, swollen lymph nodes, body aches, joint swelling,chest pain, shortness of breath, mood changes. Positive muscle aches  Objective  Blood pressure 112/62, pulse 79, resp. rate 16, weight 152 lb (68.9 kg), SpO2 98 %. Systems examined below as of 10/18/16   General: No apparent distress alert and oriented x3 mood and affect normal,  dressed appropriately.  HEENT: Pupils equal, extraocular movements intact  Respiratory: Patient's speak in full sentences and does not appear short of breath  Cardiovascular: No lower extremity edema, non tender, no erythema  Skin: Warm dry intact with no signs of infection or rash on extremities or on axial skeleton.  Abdomen: Soft nontender  Neuro: Cranial nerves II through XII are intact, neurovascularly intact in all extremities with 2+ DTRs and 2+ pulses.  Lymph: No lymphadenopathy of posterior or anterior cervical chain or axillae bilaterally.  Gait normal with good balance and coordination.  MSK:  Non tender with full range of motion and good stability and symmetric strength and tone of shoulders, elbows, wrist, hip, knee and ankles bilaterally. Mild arthritic  changes of multiple joints  Back Exam:  Inspection: Unremarkable  Motion: Flexion 35 deg, Extension 25 deg, Side Bending to 35 deg bilaterally,  Rotation to 35 deg bilaterally  SLR laying: Positive left side XSLR laying: Negative  Palpable tenderness: Tender to palpation and appears palmar suture on the lumbar spine on the left sign. Some going over the iliac's as well.Marland Kitchen FABER: negative. Sensory change: Gross sensation intact to all lumbar and sacral dermatomes.  Reflexes: 2+ at both patellar tendons, 2+ at achilles tendons, Babinski's downgoing.  Strength at foot  Plantar-flexion: 5/5 Dorsi-flexion: 5/5 Eversion: 5/5 Inversion: 5/5  Leg strength  Quad: 5/5 Hamstring: 5/5 Hip flexor: 5/5 Hip abductors: 5/5  Gait unremarkable.  Osteopathic findings  T3 extended rotated and side bent right inhaled third rib T9 extended rotated and side bent left L2 flexed rotated and side bent right L4 flexed rotated and side bent left Sacrum right on right      Impression and Recommendations:     This case required medical decision making of moderate complexity.      Note: This dictation was prepared with Dragon dictation along with smaller phrase technology. Any transcriptional errors that result from this process are unintentional.

## 2016-10-18 ENCOUNTER — Encounter: Payer: Self-pay | Admitting: Family Medicine

## 2016-10-18 ENCOUNTER — Ambulatory Visit (INDEPENDENT_AMBULATORY_CARE_PROVIDER_SITE_OTHER): Payer: Medicare Other | Admitting: Family Medicine

## 2016-10-18 DIAGNOSIS — M999 Biomechanical lesion, unspecified: Secondary | ICD-10-CM | POA: Diagnosis not present

## 2016-10-18 DIAGNOSIS — M5136 Other intervertebral disc degeneration, lumbar region: Secondary | ICD-10-CM | POA: Diagnosis not present

## 2016-10-18 DIAGNOSIS — M51369 Other intervertebral disc degeneration, lumbar region without mention of lumbar back pain or lower extremity pain: Secondary | ICD-10-CM | POA: Insufficient documentation

## 2016-10-18 NOTE — Patient Instructions (Signed)
Good to see you  Exercises 3 times a week.  Try gabapentin 300mg  next 2 nights and then write me.  If it is bette I will fill a new prescription  Ice is your friend.  See me again in 3-4 weeks.

## 2016-10-18 NOTE — Assessment & Plan Note (Signed)
Decision today to treat with OMT was based on Physical Exam  After verbal consent patient was treated with HVLA, ME, FPR techniques in  thoracic, lumbar and sacral areas  Patient tolerated the procedure well with improvement in symptoms  Patient given exercises, stretches and lifestyle modifications  See medications in patient instructions if given  Patient will follow up in 3-4 weeks 

## 2016-10-18 NOTE — Assessment & Plan Note (Signed)
Patient does have some arthritic changes noted in the back. Has had certain things like injections previously. Responded fairly well to manipulation. We'll do a trial of increasing gabapentin. We'll see how patient responds. Patient will come back and see me again in 3-4 weeks for further evaluation and treatment.

## 2016-10-18 NOTE — Progress Notes (Signed)
Pre-visit discussion using our clinic review tool. No additional management support is needed unless otherwise documented below in the visit note.  

## 2016-10-19 ENCOUNTER — Other Ambulatory Visit: Payer: Self-pay | Admitting: Internal Medicine

## 2016-11-16 ENCOUNTER — Ambulatory Visit (INDEPENDENT_AMBULATORY_CARE_PROVIDER_SITE_OTHER): Payer: Medicare Other | Admitting: Family Medicine

## 2016-11-16 ENCOUNTER — Other Ambulatory Visit: Payer: Self-pay | Admitting: Internal Medicine

## 2016-11-16 ENCOUNTER — Encounter: Payer: Self-pay | Admitting: Family Medicine

## 2016-11-16 VITALS — BP 124/80 | HR 82 | Ht 67.0 in | Wt 156.0 lb

## 2016-11-16 DIAGNOSIS — M5136 Other intervertebral disc degeneration, lumbar region: Secondary | ICD-10-CM | POA: Diagnosis not present

## 2016-11-16 DIAGNOSIS — M999 Biomechanical lesion, unspecified: Secondary | ICD-10-CM | POA: Diagnosis not present

## 2016-11-16 MED ORDER — GABAPENTIN 300 MG PO CAPS: ORAL_CAPSULE | ORAL | 3 refills | Status: DC

## 2016-11-16 NOTE — Patient Instructions (Signed)
Good to see you  Overall not bad Gabapentin 300mg  at night Stay active.  I think the manipulation helps but lets see.  An extra insole in your left shoe at all times See me again in 4-6 weeks.

## 2016-11-16 NOTE — Progress Notes (Signed)
[name] [address], [place] [address] Phone: [phone] Subjective:    I'm seeing this patient by the request  of:    CC: BACK PAIN FOLLOW-UP  [id number]  [name] [name] is a 71 y.o. male coming in with complaint of told muscle in his back. Patient states To to have low back left side pain. Has had this for years. We have seen him previously. Has been taking gabapentin in the muscle relaxer. Gabapentin 300mg  at night has no some mild improvement. Patient still has the pain in the dull throbbing aching pain. Maybe some mild improvement with manipulation.      Past Medical History:  Diagnosis Date  . Allergic rhinitis   . Anxiety 05/01/2011  . Atherosclerosis of aorta ([place])   . BPH (benign prostatic hyperplasia)   . CAD (coronary artery disease)   . Chronic prostatitis   . Diverticulitis   . DJD (degenerative joint disease), lumbosacral   . Hearing loss in right ear   . HLD (hyperlipidemia)   . HTN (hypertension)   . Inguinal hernia without mention of obstruction or gangrene, unilateral or unspecified, (not specified as recurrent)   . Ocular migraine   . Old myocardial infarction 1989  . PUD (peptic ulcer disease)   . Transient amnesia    Past Surgical History:  Procedure Laterality Date  . APPENDECTOMY    . CORONARY STENT PLACEMENT    . INGUINAL HERNIA REPAIR     Social History   Social History  . Marital status: Married    Spouse name: N/A  . Number of children: 3  . Years of education: N/A   Occupational History  . retired    Social History Main Topics  . Smoking status: Former Smoker    Quit date: 07/03/1980  . Smokeless tobacco: Never Used  . Alcohol use 0.0 oz/week     Comment: a few beers or glasses of wine a week prior to dinner  . Drug use: No  . Sexual activity: Not Asked   Other Topics Concern  . None   Social History Narrative  . None   Allergies  Allergen Reactions  . Niacin     Hot flashes  .  Other Other (See Comments)    Environmental allergies/ post nasal drip  . Penicillins     As child: muscles stiffened   Family History  Problem Relation Age of Onset  . Alcohol abuse Father   . Cancer Father   . Alcohol abuse Brother   . Cancer Unknown        grandfather  . Hypertension Unknown        grandfather  . Heart disease Unknown        grandfather  . Colon cancer Neg Hx   . Stomach cancer Neg Hx     Past medical history, social, surgical and family history all reviewed in electronic medical record.  No pertanent information unless stated regarding to the chief complaint.   Review of Systems: No headache, visual changes, nausea, vomiting, diarrhea, constipation, dizziness, abdominal pain, skin rash, fevers, chills, night sweats, weight loss, swollen lymph nodes, body aches, joint swelling, chest pain, shortness of breath, mood changes.  Positive muscle aches  Objective  Blood pressure 124/80, pulse 82, height 5\' 7"  (1.702 m), weight 156 lb (70.8 kg), SpO2 97 %.   Systems examined below as of 11/16/16 General: NAD A&O x3 mood, affect normal  HEENT: Pupils equal, extraocular movements intact  no nystagmus Respiratory: not short of breath at rest or with speaking Cardiovascular: No lower extremity edema, non tender Skin: Warm dry intact with no signs of infection or rash on extremities or on axial skeleton. Abdomen: Soft nontender, no masses Neuro: Cranial nerves  intact, neurovascularly intact in all extremities with 2+ DTRs and 2+ pulses. Lymph: No lymphadenopathy appreciated today  Gait normal with good balance and coordination.  MSK: Non tender with full range of motion and good stability and symmetric strength and tone of shoulders, elbows, wrist,  knee hips and ankles bilaterally.   Leg length discrepancy noted  Back Exam:  Inspection: Unremarkable  Motion: Flexion 45 deg, Extension 15 deg, Side Bending to 35 deg bilaterally,  Rotation to 45 deg bilaterally  SLR  laying: Negative  XSLR laying: Negative  Palpable tenderness: Tender to palpation in the paraspinal musculature of the lumbar spine.Burnis Medin see over the left L5 and S1 area FABER: Mild tightness bilaterally. Sensory change: Gross sensation intact to all lumbar and sacral dermatomes.  Reflexes: 2+ at both patellar tendons, 2+ at achilles tendons, Babinski's downgoing.  Strength at foot  Plantar-flexion: 5/5 Dorsi-flexion: 5/5 Eversion: 5/5 Inversion: 5/5  Leg strength  Quad: 5/5 Hamstring: 5/5 Hip flexor: 5/5 Hip abductors: 4/5 but symmetric Gait unremarkable.   Osteopathic findings T3 extended rotated and side bent right  T6 extended rotated and side bent left L5 flexed rotated and side bent left Sacrum right on right       Impression and Recommendations:     This case required medical decision making of moderate complexity.      Note: This dictation was prepared with Dragon dictation along with smaller phrase technology. Any transcriptional errors that result from this process are unintentional.

## 2016-11-16 NOTE — Assessment & Plan Note (Signed)
Decision today to treat with OMT was based on Physical Exam  After verbal consent patient was treated with HVLA, ME, FPR techniques in  thoracic, lumbar and sacral areas  Patient tolerated the procedure well with improvement in symptoms  Patient given exercises, stretches and lifestyle modifications  See medications in patient instructions if given  Patient will follow up in 4-6 weeks 

## 2016-11-16 NOTE — Assessment & Plan Note (Signed)
Patient is known arthritic changes of the back. We discussed with patient at great length. Discussed icing regimen and home exercises. We discussed which activities to do in which ones to avoid. In responding somewhat to manipulation. Follow-up again in 4-6 weeks.

## 2016-12-13 ENCOUNTER — Other Ambulatory Visit: Payer: Self-pay | Admitting: Internal Medicine

## 2016-12-28 ENCOUNTER — Encounter: Payer: Self-pay | Admitting: Family Medicine

## 2016-12-28 ENCOUNTER — Ambulatory Visit (INDEPENDENT_AMBULATORY_CARE_PROVIDER_SITE_OTHER)
Admission: RE | Admit: 2016-12-28 | Discharge: 2016-12-28 | Disposition: A | Discharge status: Home / Self Care | Payer: Medicare Other | Source: Ambulatory Visit | Attending: Family Medicine | Admitting: Family Medicine

## 2016-12-28 ENCOUNTER — Ambulatory Visit (INDEPENDENT_AMBULATORY_CARE_PROVIDER_SITE_OTHER): Payer: Medicare Other | Admitting: Family Medicine

## 2016-12-28 VITALS — BP 138/82 | HR 81 | Ht 67.0 in | Wt 156.0 lb

## 2016-12-28 DIAGNOSIS — M545 Low back pain: Secondary | ICD-10-CM | POA: Diagnosis not present

## 2016-12-28 DIAGNOSIS — M5136 Other intervertebral disc degeneration, lumbar region: Secondary | ICD-10-CM

## 2016-12-28 DIAGNOSIS — M999 Biomechanical lesion, unspecified: Secondary | ICD-10-CM | POA: Diagnosis not present

## 2016-12-28 NOTE — Progress Notes (Signed)
[name] [address], [place] [address] Phone: [phone] Subjective:      CC: Back pain follow-up  [id number]  [name] [name] is a 71 y.o. male coming in with complaint of told muscle in his back. Chronic problem, gabapentin, and has been coming to see me for manipulation. Encourage patient to do more with the home exercises on a regular basis. Patient states Minimal change at this time. Feels the manipulation has not been beneficial.       Past Medical History:  Diagnosis Date  . Allergic rhinitis   . Anxiety 05/01/2011  . Atherosclerosis of aorta ([place])   . BPH (benign prostatic hyperplasia)   . CAD (coronary artery disease)   . Chronic prostatitis   . Diverticulitis   . DJD (degenerative joint disease), lumbosacral   . Hearing loss in right ear   . HLD (hyperlipidemia)   . HTN (hypertension)   . Inguinal hernia without mention of obstruction or gangrene, unilateral or unspecified, (not specified as recurrent)   . Ocular migraine   . Old myocardial infarction 1989  . PUD (peptic ulcer disease)   . Transient amnesia    Past Surgical History:  Procedure Laterality Date  . APPENDECTOMY    . CORONARY STENT PLACEMENT    . INGUINAL HERNIA REPAIR     Social History   Social History  . Marital status: Married    Spouse name: N/A  . Number of children: 3  . Years of education: N/A   Occupational History  . retired    Social History Main Topics  . Smoking status: Former Smoker    Quit date: 07/03/1980  . Smokeless tobacco: Never Used  . Alcohol use 0.0 oz/week     Comment: a few beers or glasses of wine a week prior to dinner  . Drug use: No  . Sexual activity: Not Asked   Other Topics Concern  . None   Social History Narrative  . None   Allergies  Allergen Reactions  . Niacin     Hot flashes  . Other Other (See Comments)    Environmental allergies/ post nasal drip  . Penicillins     As child: muscles stiffened    Family History  Problem Relation Age of Onset  . Alcohol abuse Father   . Cancer Father   . Alcohol abuse Brother   . Cancer Unknown        grandfather  . Hypertension Unknown        grandfather  . Heart disease Unknown        grandfather  . Colon cancer Neg Hx   . Stomach cancer Neg Hx     Past medical history, social, surgical and family history all reviewed in electronic medical record.  No pertanent information unless stated regarding to the chief complaint.   Review of Systems: No headache, visual changes, nausea, vomiting, diarrhea, constipation, dizziness, abdominal pain, skin rash, fevers, chills, night sweats, weight loss, swollen lymph nodes, body aches, joint swelling, muscle aches, chest pain, shortness of breath, mood changes.    Objective  Blood pressure 138/82, pulse 81, height 5\' 7"  (1.702 m), weight 156 lb (70.8 kg), SpO2 98 %.   Systems examined below as of 12/28/16 General: NAD A&O x3 mood, affect normal  HEENT: Pupils equal, extraocular movements intact no nystagmus Respiratory: not short of breath at rest or with speaking Cardiovascular: No lower extremity edema, non tender Skin: Warm dry intact with  no signs of infection or rash on extremities or on axial skeleton. Abdomen: Soft nontender, no masses Neuro: Cranial nerves  intact, neurovascularly intact in all extremities with 2+ DTRs and 2+ pulses. Lymph: No lymphadenopathy appreciated today  Gait normal with good balance and coordination.  MSK: Non tender with full range of motion and good stability and symmetric strength and tone of shoulders, elbows, wrist,  knee hips and ankles bilaterally.    Back Exam:  Inspection: Unremarkable  Motion: Flexion 45 deg, Extension 30 deg, Side Bending to 45 deg bilaterally,  Rotation to 45 deg bilaterally  SLR laying: Negative  XSLR laying: Negative  Palpable tenderness: Tenderness still in the paraspinal musculature of the left side lumbar spine. FABER:  negative. Sensory change: Gross sensation intact to all lumbar and sacral dermatomes.  Reflexes: 2+ at both patellar tendons, 2+ at achilles tendons, Babinski's downgoing.  Strength at foot  But symmetric    Impression and Recommendations:     This case required medical decision making of moderate complexity.      Note: This dictation was prepared with Dragon dictation along with smaller phrase technology. Any transcriptional errors that result from this process are unintentional.

## 2016-12-28 NOTE — Assessment & Plan Note (Signed)
Degenerative disc disease [place]. Patient will given X-rays. Make sure there is no herniation. Seems to be more fatiguing and muscular. Declined physical therapy again. We discussed the possibility need for advanced imaging. Patient was to hold on that as well. Patient has failed most conservative therapy at this point and we'll consider other treatment options otherwise in the near future. Follow-up again in 6 weeks after starting the over-the-counter medications suggested. Has had some difficult he with noncompliance.

## 2016-12-28 NOTE — Patient Instructions (Addendum)
Abs look good overall.  I would still try Iron 65mg  daily with 500mg  of vitamin C.  I thnik you may notice an improvement.  Get xray downstairs today  Stay active.  We can consider physical therapy if you would like more advance exercises Otherwise keep doing what you are doing.  Stay hydrated.  See me again in 6 weeks.

## 2017-02-06 DIAGNOSIS — I739 Peripheral vascular disease, unspecified: Secondary | ICD-10-CM | POA: Diagnosis not present

## 2017-02-06 DIAGNOSIS — E785 Hyperlipidemia, unspecified: Secondary | ICD-10-CM | POA: Diagnosis not present

## 2017-02-06 DIAGNOSIS — Z6824 Body mass index (BMI) 24.0-24.9, adult: Secondary | ICD-10-CM | POA: Diagnosis not present

## 2017-02-06 DIAGNOSIS — I2510 Atherosclerotic heart disease of native coronary artery without angina pectoris: Secondary | ICD-10-CM | POA: Diagnosis not present

## 2017-02-06 DIAGNOSIS — I252 Old myocardial infarction: Secondary | ICD-10-CM | POA: Diagnosis not present

## 2017-02-08 ENCOUNTER — Ambulatory Visit (INDEPENDENT_AMBULATORY_CARE_PROVIDER_SITE_OTHER): Payer: Medicare Other | Admitting: Family Medicine

## 2017-02-08 ENCOUNTER — Encounter: Payer: Self-pay | Admitting: Family Medicine

## 2017-02-08 DIAGNOSIS — M24552 Contracture, left hip: Secondary | ICD-10-CM | POA: Diagnosis not present

## 2017-02-08 NOTE — Patient Instructions (Signed)
Good to see you  I am sorry for the alarms Keep it up  Ice when you need it  Stay active See me again in 6-8 weeks

## 2017-02-08 NOTE — Progress Notes (Signed)
[name] [address] [place], [place] [address] Phone: [phone] Subjective:      CC: Back pain follow-up  [id number]  [name] [name] is a 71 y.o. male coming in with complaint of told muscle in his back. Has been chronic. Continued have worsening pain. Patient was found to have this use arthritic changes with mild in nature worse at L4-L5. X-rays were taken on 12/28/2016 independently visualized by me. Patient continues with the home exercises, has declined formal physical therapy past, and is only taking the over-the-counter medications intermittently. Patient states feeling much better patient has been doing some home exercises. Found the different ways to stretch.       Past Medical History:  Diagnosis Date  . Allergic rhinitis   . Anxiety 05/01/2011  . Atherosclerosis of aorta ([place])   . BPH (benign prostatic hyperplasia)   . CAD (coronary artery disease)   . Chronic prostatitis   . Diverticulitis   . DJD (degenerative joint disease), lumbosacral   . Hearing loss in right ear   . HLD (hyperlipidemia)   . HTN (hypertension)   . Inguinal hernia without mention of obstruction or gangrene, unilateral or unspecified, (not specified as recurrent)   . Ocular migraine   . Old myocardial infarction 1989  . PUD (peptic ulcer disease)   . Transient amnesia    Past Surgical History:  Procedure Laterality Date  . APPENDECTOMY    . CORONARY STENT PLACEMENT    . INGUINAL HERNIA REPAIR     Social History   Social History  . Marital status: Married    Spouse name: N/A  . Number of children: 3  . Years of education: N/A   Occupational History  . retired    Social History Main Topics  . Smoking status: Former Smoker    Quit date: 07/03/1980  . Smokeless tobacco: Never Used  . Alcohol use 0.0 oz/week     Comment: a few beers or glasses of wine a week prior to dinner  . Drug use: No  . Sexual activity: Not on file   Other Topics Concern    . Not on file   Social History Narrative  . No narrative on file   Allergies  Allergen Reactions  . Niacin     Hot flashes  . Other Other (See Comments)    Environmental allergies/ post nasal drip  . Penicillins     As child: muscles stiffened   Family History  Problem Relation Age of Onset  . Alcohol abuse Father   . Cancer Father   . Alcohol abuse Brother   . Cancer Unknown        grandfather  . Hypertension Unknown        grandfather  . Heart disease Unknown        grandfather  . Colon cancer Neg Hx   . Stomach cancer Neg Hx     Past medical history, social, surgical and family history all reviewed in electronic medical record.  No pertanent information unless stated regarding to the chief complaint.   Review of Systems: No headache, visual changes, nausea, vomiting, diarrhea, constipation, dizziness, abdominal pain, skin rash, fevers, chills, night sweats, weight loss, swollen lymph nodes, body aches, joint swelling, muscle aches, chest pain, shortness of breath, mood changes.     Objective  There were no vitals taken for this visit.   Systems examined below as of 02/08/17 General: NAD A&O x3 mood, affect normal  HEENT:  Pupils equal, extraocular movements intact no nystagmus Respiratory: not short of breath at rest or with speaking Cardiovascular: No lower extremity edema, non tender Skin: Warm dry intact with no signs of infection or rash on extremities or on axial skeleton. Abdomen: Soft nontender, no masses Neuro: Cranial nerves  intact, neurovascularly intact in all extremities with 2+ DTRs and 2+ pulses. Lymph: No lymphadenopathy appreciated today  Gait normal with good balance and coordination.  MSK: Non tender with full range of motion and good stability and symmetric strength and tone of shoulders, elbows, wrist,  knee hips and ankles bilaterally.    Back Exam:  Inspection: Unremarkable  Motion: Flexion 45 deg, Extension 25 deg, Side Bending to 35 deg  bilaterally,  Rotation to 45 deg bilaterally  SLR laying: Negative  XSLR laying: Negative  Palpable tenderness: Mild tenderness to palpation in the paraspinal muscular trauma lumbar spine. FABER: negative. Sensory change: Gross sensation intact to all lumbar and sacral dermatomes.  Reflexes: 2+ at both patellar tendons, 2+ at achilles tendons, Babinski's downgoing.  Strength at foot  Plantar-flexion: 5/5 Dorsi-flexion: 5/5 Eversion: 5/5 Inversion: 5/5  Leg strength  Quad: 5/5 Hamstring: 5/5 Hip flexor: 5/5 Hip abductors: 5/5  Gait unremarkable.     Impression and Recommendations:     This case required medical decision making of moderate complexity.      Note: This dictation was prepared with Dragon dictation along with smaller phrase technology. Any transcriptional errors that result from this process are unintentional.

## 2017-02-08 NOTE — Assessment & Plan Note (Signed)
Mild tender doing better, doing well, start decrease medications.  Will return in 2-3 months

## 2017-02-15 DIAGNOSIS — H6123 Impacted cerumen, bilateral: Secondary | ICD-10-CM | POA: Diagnosis not present

## 2017-02-15 DIAGNOSIS — H903 Sensorineural hearing loss, bilateral: Secondary | ICD-10-CM | POA: Diagnosis not present

## 2017-03-09 ENCOUNTER — Other Ambulatory Visit: Payer: Self-pay | Admitting: Internal Medicine

## 2017-03-12 ENCOUNTER — Encounter: Payer: Self-pay | Admitting: Family Medicine

## 2017-03-12 DIAGNOSIS — M5136 Other intervertebral disc degeneration, lumbar region: Secondary | ICD-10-CM

## 2017-03-13 DIAGNOSIS — Z23 Encounter for immunization: Secondary | ICD-10-CM | POA: Diagnosis not present

## 2017-03-15 ENCOUNTER — Ambulatory Visit (INDEPENDENT_AMBULATORY_CARE_PROVIDER_SITE_OTHER): Payer: Medicare Other | Admitting: Physical Therapy

## 2017-03-15 ENCOUNTER — Encounter: Payer: Self-pay | Admitting: Physical Therapy

## 2017-03-15 DIAGNOSIS — M545 Low back pain, unspecified: Secondary | ICD-10-CM

## 2017-03-15 DIAGNOSIS — G8929 Other chronic pain: Secondary | ICD-10-CM | POA: Diagnosis not present

## 2017-03-15 NOTE — Therapy (Signed)
[facility] [address] [place], [place], [address] Phone: [phone]   Fax:  [phone]  Physical Therapy Evaluation  Patient Details  Name: [name] [name] MRN: [id number] Date of Birth: [birth year] Referring Provider: Charlann Boxer  Encounter Date: 03/15/2017      PT End of Session - 03/15/17 1049    Visit Number 1   Number of Visits 12   Date for PT Re-Evaluation 04/26/17   Authorization Type Medicare / G -Codes    PT Start Time Oct 18, 1103   PT Stop Time 1150   PT Time Calculation (min) 45 min   Activity Tolerance Patient tolerated treatment well   Behavior During Therapy [facility] for tasks assessed/performed      Past Medical History:  Diagnosis Date  . Allergic rhinitis   . Anxiety 05/01/2011  . Atherosclerosis of aorta ([place])   . BPH (benign prostatic hyperplasia)   . CAD (coronary artery disease)   . Chronic prostatitis   . Diverticulitis   . DJD (degenerative joint disease), lumbosacral   . Hearing loss in right ear   . HLD (hyperlipidemia)   . HTN (hypertension)   . Inguinal hernia without mention of obstruction or gangrene, unilateral or unspecified, (not specified as recurrent)   . Ocular migraine   . Old myocardial infarction 10-18-1987  . PUD (peptic ulcer disease)   . Transient amnesia     Past Surgical History:  Procedure Laterality Date  . APPENDECTOMY    . CORONARY STENT PLACEMENT    . INGUINAL HERNIA REPAIR      There were no vitals filed for this visit.       Subjective Assessment - 03/15/17 1051    Pertinent History Pt states history of ongoing back pain on L, worsened at end of the day and with increased activity.  He is currently walking 1 hour/day, and some floor exercises.  He is retired. He has been able to stay active, and has tried lots of stretches and exercises, but does not feel like anything is really helping to eliminate pain.    Limitations Lifting;Standing;Walking   Diagnostic tests Recent X-ray     Patient Stated Goals Decreased pain    Currently in Pain? Yes   Pain Score 5    Pain Location Back   Pain Orientation Left;Lower   Pain Descriptors / Indicators Aching;Stabbing   Pain Type Chronic pain   Pain Onset More than a month ago   Pain Frequency Intermittent            [facility] PT Assessment - 03/15/17 0001      Assessment   Medical Diagnosis Degenerative Disc Disease   Referring Provider [facility]   Next MD Visit none scheduled    Prior Therapy yes     Precautions   Precautions None     Balance Screen   Has the patient fallen in the past 6 months No     Prior Function   Level of Independence Independent     ROM / Strength   AROM / PROM / Strength AROM;Strength     AROM   AROM Assessment Site Lumbar;Hip   Right/Left Hip Right;Left   Right Hip Extension --  moderate limitation   Right Hip External Rotation  --  moderate limitation   Right Hip Internal Rotation  --  moderate limitation   Left Hip Extension --  moderate limitation   Left Hip External Rotation  --  moderate limitation   Left Hip  Internal Rotation  --  moderate limitation   Lumbar Flexion moderate/significant deficit   Lumbar Extension mild/moderate deficit   Lumbar - Right Side Bend moderate deficit   Lumbar - Left Side Bend moderate deficit   Lumbar - Right Rotation moderate deficit   Lumbar - Left Rotation moderate defiict     Strength   Overall Strength Comments Hips: 4/5,  Core: 3/5     Flexibility   Soft Tissue Assessment /Muscle Length yes   Hamstrings significant limitation   Quadriceps moderate limitation   Piriformis moderate limitation   Quadratus Lumborum significant limitation     Palpation   Palpation comment Stiffness in lumbar spine vertebrae with PA and spring testing;  Tightness and soreness in L lumbar paraspinal and QL;  Tightness in B hasmtrings; Hypertonicity of lumbar extensors.     Posture: leg length, illiac crests, pelvis= even, minimal mal-alignment             Objective measurements completed on examination: See above findings.          [facility] Adult PT Treatment/Exercise - 03/15/17 0001      Exercises   Exercises Lumbar     Lumbar Exercises: Stretches   Passive Hamstring Stretch 3 reps;30 seconds   Passive Hamstring Stretch Limitations Seated in chair   Single Knee to Chest Stretch 3 reps;30 seconds   Double Knee to Chest Stretch 3 reps;30 seconds   Standing Side Bend 3 reps;30 seconds   Standing Side Bend Limitations QL stretch at wall   Piriformis Stretch 3 reps;30 seconds   Piriformis Stretch Limitations Seated in chair     Lumbar Exercises: Supine   Other Supine Lumbar Exercises Supine hip IR stretch     Modalities   Modalities --     Manual Therapy   Manual Therapy --                PT Education - 03/15/17 1324    Education provided Yes   Education Details Initial HEP, education to only perform THIS HEP, and not do all other exercises that he has been doing in the past.    Person(s) Educated Patient   Methods Explanation;Handout   Comprehension Verbalized understanding;Need further instruction          PT Short Term Goals - 03/15/17 1331      PT SHORT TERM GOAL #1   Title Pt to demo independence with initial HEP, with correct mechanics.    Time 2   Period Weeks   Status New   Target Date 03/29/17           PT Long Term Goals - 03/15/17 1332      PT LONG TERM GOAL #1   Title Pt to demo increased lumbar flexion ROM, to have minimal deficit ( at most) to improve ability for bending, squatting, and IADLS    Time 6   Period Weeks   Status New   Target Date 04/26/17     PT LONG TERM GOAL #2   Title Pt to demo decreased pain in L sided lumbar region, to 0-2/10 with activity and standing, to improve ability for community activities and ability for walking program   Time 6   Period Weeks   Status New   Target Date 04/26/17     PT LONG TERM GOAL #3   Title Pt to demo independence  for long term HEP, for stretching and mobility, with correct mechanics, to improve pain and decrease risk for  re-injury.    Time 6   Period Weeks   Status New   Target Date 04/26/17                Plan - 2017-03-17 1325    Clinical Impression Statement Pt presents with primary complaint of increased and ongoing pain in L sided low back . He has stiffness in lumbar spine, with decreased ROM, as well as significant tightness in lumbar musculature, hamstrings, and hips. He requires max cuing for initial HEP today, with cuing for relaxing muscles and focusing on form. He has increased soreness to palpate L paraspinal region in high/mid lumbar region. He has decreased ability for full functional activities and sustaining activity, due to pain. Pt to benefit from skilled care to teach effective HEP, improve lumbar ROM, and decrease pain.    Clinical Presentation Stable   Clinical Decision Making Low   Rehab Potential Good   PT Frequency 2x / week   PT Duration 6 weeks   PT Treatment/Interventions ADLs/Self Care Home Management;Cryotherapy;Electrical Stimulation;Moist Heat;Ultrasound;Neuromuscular re-education;Therapeutic exercise;Therapeutic activities;Functional mobility training;Stair training;Gait training;Patient/family education;Manual techniques;Passive range of motion;Taping;Dry needling   PT Next Visit Plan Review, progress HEP, manual for lumbar stiffness    PT Home Exercise Plan SKTC/DKTC; seated HSS;  Seated piriformis;  supine hip IR;    Consulted and Agree with Plan of Care Patient      Patient will benefit from skilled therapeutic intervention in order to improve the following deficits and impairments:  Abnormal gait, Decreased activity tolerance, Decreased endurance, Hypomobility, Decreased strength, Pain, Increased muscle spasms, Decreased range of motion, Impaired flexibility  Visit Diagnosis: Chronic left-sided low back pain without sciatica - Plan: PT plan of care  cert/re-cert      [facility] PT PB G-CODES - 2017/03/17 1348    Functional Assessment Tool Used  Clinical judgement   Functional Limitations Mobility: Walking and moving around   Mobility: Walking and Moving Around Current Status At least 1 percent but less than 20 percent impaired, limited or restricted   Mobility: Walking and Moving Around Goal Status [phone]) At least 1 percent but less than 20 percent impaired, limited or restricted       Problem List Patient Active Problem List   Diagnosis Date Noted  . Degenerative disc disease, lumbar 10/18/2016  . Nonallopathic lesion of lumbosacral region 10/18/2016  . Nonallopathic lesion of sacral region 10/18/2016  . Nonallopathic lesion of thoracic region 10/18/2016  . Insomnia 09/28/2016  . Transaminitis 12/29/2015  . GERD (gastroesophageal reflux disease) 12/29/2015  . Atherosclerotic peripheral vascular disease ([place]) 11/23/2015  . Chronic meniscal tear of knee 10/12/2015  . Left hip flexor tightness 05/13/2015  . Strain of adductor magnus muscle of left lower extremity 04/06/2014  . Chronic pain of left groin 03/19/2014  . Abdominal pain, other specified site 06/11/2012  . Left groin pain 12/11/2011  . Conjunctivitis, acute, bilateral 09/05/2011  . Anxiety 05/01/2011  . Bladder neck obstruction 05/01/2011  . Preventative health care 04/30/2011  . MYOCARDIAL PERFUSION SCAN, WITH STRESS TEST, ABNORMAL 06/02/2010  . Chest pain 05/20/2010  . FATIGUE 04/27/2010  . OCULAR MIGRAINE 11/18/2009  . AMNESIA, TRANSIENT GLOBAL 11/18/2009  . Unspecified transient cerebral ischemia 04/06/2009  . HEARING LOSS, RIGHT EAR 03/19/2009  . Atherosclerosis of aorta ([place]) 03/19/2009  . FROZEN RIGHT SHOULDER 03/19/2009  . INGUINAL HERNIA, RIGHT 08/29/2007  . EPIGASTRIC PAIN 08/29/2007  . MYOCARDIAL INFARCTION, HX OF 07/13/2007  . Hyperlipemia 03/06/2007  . Essential hypertension 03/06/2007  . CORONARY ARTERY  DISEASE 03/06/2007  . Allergic rhinitis  03/06/2007  . PEPTIC ULCER DISEASE 03/06/2007  . BENIGN PROSTATIC HYPERTROPHY 03/06/2007  . PROSTATITIS, CHRONIC 03/06/2007  . DIVERTICULITIS, HX OF 03/06/2007   Lyndee Hensen, PT, DPT 2:00 PM  03/15/17    [facility] [place], [place], [address] Phone: [phone]   Fax:  [phone]  Name: [name] [name] MRN: [id number] Date of Birth: January 31, 1946

## 2017-03-19 ENCOUNTER — Ambulatory Visit (INDEPENDENT_AMBULATORY_CARE_PROVIDER_SITE_OTHER): Payer: Medicare Other | Admitting: Physical Therapy

## 2017-03-19 ENCOUNTER — Encounter: Payer: Self-pay | Admitting: Physical Therapy

## 2017-03-19 DIAGNOSIS — M545 Low back pain, unspecified: Secondary | ICD-10-CM

## 2017-03-19 DIAGNOSIS — G8929 Other chronic pain: Secondary | ICD-10-CM | POA: Diagnosis not present

## 2017-03-19 NOTE — Therapy (Signed)
[facility] [address] [place], [place], [address] Phone: [phone]   Fax:  [phone]  Physical Therapy Treatment  Patient Details  Name: [name] [name] MRN: [id number] Date of Birth: 1945/12/15 Referring Provider: Charlann Boxer  Encounter Date: 03/19/2017      PT End of Session - 03/19/17 1009    Visit Number 2   Number of Visits 12   Date for PT Re-Evaluation 04/26/17   Authorization Type Medicare / G -Codes    PT Start Time 24-Oct-1001   PT Stop Time 1045   PT Time Calculation (min) 42 min   Activity Tolerance Patient tolerated treatment well   Behavior During Therapy [facility] for tasks assessed/performed      Past Medical History:  Diagnosis Date  . Allergic rhinitis   . Anxiety 05/01/2011  . Atherosclerosis of aorta ([place])   . BPH (benign prostatic hyperplasia)   . CAD (coronary artery disease)   . Chronic prostatitis   . Diverticulitis   . DJD (degenerative joint disease), lumbosacral   . Hearing loss in right ear   . HLD (hyperlipidemia)   . HTN (hypertension)   . Inguinal hernia without mention of obstruction or gangrene, unilateral or unspecified, (not specified as recurrent)   . Ocular migraine   . Old myocardial infarction 10-25-87  . PUD (peptic ulcer disease)   . Transient amnesia     Past Surgical History:  Procedure Laterality Date  . APPENDECTOMY    . CORONARY STENT PLACEMENT    . INGUINAL HERNIA REPAIR      There were no vitals filed for this visit.      Subjective Assessment - 03/19/17 1000    Subjective Pt states no increased pain over the weekend. He has been doing HEP, but has questions about form.    Pain Score 0-No pain                         [facility] Adult PT Treatment/Exercise - 03/19/17 1009      Lumbar Exercises: Stretches   Passive Hamstring Stretch 3 reps;30 seconds   Passive Hamstring Stretch Limitations Seated in chair   Double Knee to Chest Stretch 3 reps;30 seconds    Quadruped Mid Back Stretch 3 reps;30 seconds   Quadruped Mid Back Stretch Limitations Prayer stretch/Childs pose, Center, L, R   Piriformis Stretch 3 reps;30 seconds   Piriformis Stretch Limitations Seated in chair     Lumbar Exercises: Seated   Hip Flexion on Ball 5 reps   Hip Flexion on Ball Limitations Fwd, L, R     Lumbar Exercises: Supine   Other Supine Lumbar Exercises Supine hip IR stretch x2 min     Manual Therapy   Manual Therapy Joint mobilization;Soft tissue mobilization   Joint Mobilization lumbar PA mobs, grade 3   Soft tissue mobilization DTM L lumbar paraspinals                PT Education - 03/19/17 1008    Education provided Yes   Education Details HEP review    Person(s) Educated Patient   Methods Explanation   Comprehension Verbalized understanding;Need further instruction          PT Short Term Goals - 03/15/17 1331      PT SHORT TERM GOAL #1   Title Pt to demo independence with initial HEP, with correct mechanics.    Time 2   Period Weeks   Status New  Target Date 03/29/17           PT Long Term Goals - 03/15/17 1332      PT LONG TERM GOAL #1   Title Pt to demo increased lumbar flexion ROM, to have minimal deficit ( at most) to improve ability for bending, squatting, and IADLS    Time 6   Period Weeks   Status New   Target Date 04/26/17     PT LONG TERM GOAL #2   Title Pt to demo decreased pain in L sided lumbar region, to 0-2/10 with activity and standing, to improve ability for community activities and ability for walking program   Time 6   Period Weeks   Status New   Target Date 04/26/17     PT LONG TERM GOAL #3   Title Pt to demo independence for long term HEP, for stretching and mobility, with correct mechanics, to improve pain and decrease risk for re-injury.    Time 6   Period Weeks   Status New   Target Date 04/26/17               Plan - 03/19/17 1054    Clinical Impression Statement Pt requires max  cuing for performing stretches correctly, with review of HEP. Pt with increased tightness in L lumbar paraspinal vs R. Manual therapy and ther ex done for improving lumbar ROM, and tightness in paraspinals and QL. Plan to conitnue education on HEP, and progress ther ex as tolerated.    Rehab Potential Good   PT Frequency 2x / week   PT Duration 6 weeks   PT Treatment/Interventions ADLs/Self Care Home Management;Cryotherapy;Electrical Stimulation;Moist Heat;Ultrasound;Neuromuscular re-education;Therapeutic exercise;Therapeutic activities;Functional mobility training;Stair training;Gait training;Patient/family education;Manual techniques;Passive range of motion;Taping;Dry needling   PT Next Visit Plan Review, progress HEP, manual for lumbar stiffness    PT Home Exercise Plan SKTC/DKTC; seated HSS;  Seated piriformis;  supine hip IR; seated fwd flexion w Diona Foley;  Quadruped/Childs pose;    Consulted and Agree with Plan of Care Patient      Patient will benefit from skilled therapeutic intervention in order to improve the following deficits and impairments:  Abnormal gait, Decreased activity tolerance, Decreased endurance, Hypomobility, Decreased strength, Pain, Increased muscle spasms, Decreased range of motion, Impaired flexibility  Visit Diagnosis: Chronic left-sided low back pain without sciatica     Problem List Patient Active Problem List   Diagnosis Date Noted  . Degenerative disc disease, lumbar 10/18/2016  . Nonallopathic lesion of lumbosacral region 10/18/2016  . Nonallopathic lesion of sacral region 10/18/2016  . Nonallopathic lesion of thoracic region 10/18/2016  . Insomnia 09/28/2016  . Transaminitis 12/29/2015  . GERD (gastroesophageal reflux disease) 12/29/2015  . Atherosclerotic peripheral vascular disease ([place]) 11/23/2015  . Chronic meniscal tear of knee 10/12/2015  . Left hip flexor tightness 05/13/2015  . Strain of adductor magnus muscle of left lower extremity 04/06/2014   . Chronic pain of left groin 03/19/2014  . Abdominal pain, other specified site 06/11/2012  . Left groin pain 12/11/2011  . Conjunctivitis, acute, bilateral 09/05/2011  . Anxiety 05/01/2011  . Bladder neck obstruction 05/01/2011  . Preventative health care 04/30/2011  . MYOCARDIAL PERFUSION SCAN, WITH STRESS TEST, ABNORMAL 06/02/2010  . Chest pain 05/20/2010  . FATIGUE 04/27/2010  . OCULAR MIGRAINE 11/18/2009  . AMNESIA, TRANSIENT GLOBAL 11/18/2009  . Unspecified transient cerebral ischemia 04/06/2009  . HEARING LOSS, RIGHT EAR 03/19/2009  . Atherosclerosis of aorta ([place]) 03/19/2009  . FROZEN RIGHT SHOULDER 03/19/2009  .  INGUINAL HERNIA, RIGHT 08/29/2007  . EPIGASTRIC PAIN 08/29/2007  . MYOCARDIAL INFARCTION, HX OF 07/13/2007  . Hyperlipemia 03/06/2007  . Essential hypertension 03/06/2007  . CORONARY ARTERY DISEASE 03/06/2007  . Allergic rhinitis 03/06/2007  . PEPTIC ULCER DISEASE 03/06/2007  . BENIGN PROSTATIC HYPERTROPHY 03/06/2007  . PROSTATITIS, CHRONIC 03/06/2007  . DIVERTICULITIS, HX OF 03/06/2007   Lyndee Hensen, PT, DPT 10:59 AM  03/19/17    [facility] [place], [place], [address] Phone: [phone]   Fax:  [phone]  Name: Coady Train MRN: [id number] Date of Birth: 1945/10/24

## 2017-03-23 ENCOUNTER — Telehealth: Payer: Self-pay | Admitting: Internal Medicine

## 2017-03-23 ENCOUNTER — Encounter: Payer: Self-pay | Admitting: Physical Therapy

## 2017-03-23 ENCOUNTER — Ambulatory Visit (INDEPENDENT_AMBULATORY_CARE_PROVIDER_SITE_OTHER): Payer: Medicare Other | Admitting: Physical Therapy

## 2017-03-23 DIAGNOSIS — M545 Low back pain: Secondary | ICD-10-CM | POA: Diagnosis not present

## 2017-03-23 DIAGNOSIS — G8929 Other chronic pain: Secondary | ICD-10-CM

## 2017-03-23 NOTE — Telephone Encounter (Signed)
Patient would like to be removed indefinitely from all reminder calls from our office. I advised the patient that we would be in contact with him once we receive an update from the correct individuals on how to submit his information for this request and that it would take time. Patient stated he understood. I am awaiting an update from [place] Public affairs consultant) on an update.

## 2017-03-23 NOTE — Therapy (Signed)
[facility] [address] [place], [place], [address] Phone: [phone]   Fax:  [phone]  Physical Therapy Treatment  Patient Details  Name: [name] [name] MRN: [id number] Date of Birth: [birth year] Referring Provider: Charlann Boxer  Encounter Date: 03/23/2017      PT End of Session - 03/23/17 1154    Visit Number 3   Number of Visits 12   Date for PT Re-Evaluation 04/26/17   Authorization Type Medicare / G -Codes    PT Start Time 10/23/1103   PT Stop Time 1146   PT Time Calculation (min) 41 min   Activity Tolerance Patient tolerated treatment well   Behavior During Therapy [facility] for tasks assessed/performed      Past Medical History:  Diagnosis Date  . Allergic rhinitis   . Anxiety 05/01/2011  . Atherosclerosis of aorta ([place])   . BPH (benign prostatic hyperplasia)   . CAD (coronary artery disease)   . Chronic prostatitis   . Diverticulitis   . DJD (degenerative joint disease), lumbosacral   . Hearing loss in right ear   . HLD (hyperlipidemia)   . HTN (hypertension)   . Inguinal hernia without mention of obstruction or gangrene, unilateral or unspecified, (not specified as recurrent)   . Ocular migraine   . Old myocardial infarction 10-23-87  . PUD (peptic ulcer disease)   . Transient amnesia     Past Surgical History:  Procedure Laterality Date  . APPENDECTOMY    . CORONARY STENT PLACEMENT    . INGUINAL HERNIA REPAIR      There were no vitals filed for this visit.      Subjective Assessment - 03/23/17 1153    Subjective Pt states that he had increased pain during the night last night, and didnt sleep well. He did some yard work yesterday afternoon, but didnt think he over did it.    Pain Score 3    Pain Location Back   Pain Orientation Left   Pain Descriptors / Indicators Aching;Tightness   Pain Type Chronic pain   Pain Onset More than a month ago   Pain Frequency Intermittent                          [facility] Adult PT Treatment/Exercise - 03/23/17 1105      Lumbar Exercises: Stretches   Passive Hamstring Stretch 3 reps;30 seconds   Passive Hamstring Stretch Limitations Seated in chair   Double Knee to Chest Stretch 3 reps;30 seconds   Lower Trunk Rotation 5 reps;10 seconds   Quadruped Mid Back Stretch 3 reps;30 seconds   Quadruped Mid Back Stretch Limitations Prayer stretch/Childs pose, Center, L, R   Piriformis Stretch --   Piriformis Stretch Limitations --     Lumbar Exercises: Seated   Hip Flexion on Ball --   Hip Flexion on Ball Limitations --     Lumbar Exercises: Supine   Other Supine Lumbar Exercises --     Manual Therapy   Manual Therapy Joint mobilization;Soft tissue mobilization   Joint Mobilization lumbar PA mobs, grade 3   Soft tissue mobilization DTM L lumbar paraspinals, IASTM to same                PT Education - 03/23/17 1154    Education provided Yes   Education Details On mechanics for ther ex.    Person(s) Educated Patient   Methods Explanation   Comprehension Verbalized understanding  PT Short Term Goals - 03/15/17 1331      PT SHORT TERM GOAL #1   Title Pt to demo independence with initial HEP, with correct mechanics.    Time 2   Period Weeks   Status New   Target Date 03/29/17           PT Long Term Goals - 03/15/17 1332      PT LONG TERM GOAL #1   Title Pt to demo increased lumbar flexion ROM, to have minimal deficit ( at most) to improve ability for bending, squatting, and IADLS    Time 6   Period Weeks   Status New   Target Date 04/26/17     PT LONG TERM GOAL #2   Title Pt to demo decreased pain in L sided lumbar region, to 0-2/10 with activity and standing, to improve ability for community activities and ability for walking program   Time 6   Period Weeks   Status New   Target Date 04/26/17     PT LONG TERM GOAL #3   Title Pt to demo independence for long term HEP, for stretching and mobility, with correct  mechanics, to improve pain and decrease risk for re-injury.    Time 6   Period Weeks   Status New   Target Date 04/26/17               Plan - 03/23/17 1155    Clinical Impression Statement Pt requires mod cuing for mechanics with ther ex today. Ther ex and manual therapy done for lumbar ROM, and tightness in L Lumbar region. Plan to progress core strength as pain and tightness improve.    Rehab Potential Good   PT Frequency 2x / week   PT Duration 6 weeks   PT Treatment/Interventions ADLs/Self Care Home Management;Cryotherapy;Electrical Stimulation;Moist Heat;Ultrasound;Neuromuscular re-education;Therapeutic exercise;Therapeutic activities;Functional mobility training;Stair training;Gait training;Patient/family education;Manual techniques;Passive range of motion;Taping;Dry needling   PT Next Visit Plan Review, progress HEP, manual for lumbar stiffness    PT Home Exercise Plan SKTC/DKTC; seated HSS;  Seated piriformis;  supine hip IR; seated fwd flexion w Diona Foley;  Quadruped/Childs pose;    Consulted and Agree with Plan of Care Patient      Patient will benefit from skilled therapeutic intervention in order to improve the following deficits and impairments:  Abnormal gait, Decreased activity tolerance, Decreased endurance, Hypomobility, Decreased strength, Pain, Increased muscle spasms, Decreased range of motion, Impaired flexibility  Visit Diagnosis: Chronic left-sided low back pain without sciatica     Problem List Patient Active Problem List   Diagnosis Date Noted  . Degenerative disc disease, lumbar 10/18/2016  . Nonallopathic lesion of lumbosacral region 10/18/2016  . Nonallopathic lesion of sacral region 10/18/2016  . Nonallopathic lesion of thoracic region 10/18/2016  . Insomnia 09/28/2016  . Transaminitis 12/29/2015  . GERD (gastroesophageal reflux disease) 12/29/2015  . Atherosclerotic peripheral vascular disease ([place]) 11/23/2015  . Chronic meniscal tear of knee  10/12/2015  . Left hip flexor tightness 05/13/2015  . Strain of adductor magnus muscle of left lower extremity 04/06/2014  . Chronic pain of left groin 03/19/2014  . Abdominal pain, other specified site 06/11/2012  . Left groin pain 12/11/2011  . Conjunctivitis, acute, bilateral 09/05/2011  . Anxiety 05/01/2011  . Bladder neck obstruction 05/01/2011  . Preventative health care 04/30/2011  . MYOCARDIAL PERFUSION SCAN, WITH STRESS TEST, ABNORMAL 06/02/2010  . Chest pain 05/20/2010  . FATIGUE 04/27/2010  . OCULAR MIGRAINE 11/18/2009  . AMNESIA, TRANSIENT GLOBAL  11/18/2009  . Unspecified transient cerebral ischemia 04/06/2009  . HEARING LOSS, RIGHT EAR 03/19/2009  . Atherosclerosis of aorta ([place]) 03/19/2009  . FROZEN RIGHT SHOULDER 03/19/2009  . INGUINAL HERNIA, RIGHT 08/29/2007  . EPIGASTRIC PAIN 08/29/2007  . MYOCARDIAL INFARCTION, HX OF 07/13/2007  . Hyperlipemia 03/06/2007  . Essential hypertension 03/06/2007  . CORONARY ARTERY DISEASE 03/06/2007  . Allergic rhinitis 03/06/2007  . PEPTIC ULCER DISEASE 03/06/2007  . BENIGN PROSTATIC HYPERTROPHY 03/06/2007  . PROSTATITIS, CHRONIC 03/06/2007  . DIVERTICULITIS, HX OF 03/06/2007    Lyndee Hensen, PT, DPT 11:59 AM  03/23/17   [facility] [place], [place], [address] Phone: [phone]   Fax:  [phone]  Name: [place] MRN: [id number] Date of Birth: 08/28/1945

## 2017-03-26 ENCOUNTER — Encounter: Payer: Self-pay | Admitting: Physical Therapy

## 2017-03-26 ENCOUNTER — Ambulatory Visit (INDEPENDENT_AMBULATORY_CARE_PROVIDER_SITE_OTHER): Payer: Medicare Other | Admitting: Physical Therapy

## 2017-03-26 DIAGNOSIS — M545 Low back pain: Secondary | ICD-10-CM

## 2017-03-26 DIAGNOSIS — G8929 Other chronic pain: Secondary | ICD-10-CM

## 2017-03-26 NOTE — Therapy (Signed)
[facility] [address] [place], [place], [address] Phone: [phone]   Fax:  [phone]  Physical Therapy Treatment  Patient Details  Name: [name] [name] MRN: [id number] Date of Birth: 11-17-1945 Referring Provider: Charlann Boxer  Encounter Date: 03/26/2017      PT End of Session - 03/26/17 1023    Visit Number 4   Number of Visits 12   Date for PT Re-Evaluation 04/26/17   Authorization Type Medicare / G -Codes    PT Start Time 1010   PT Stop Time 1058   PT Time Calculation (min) 48 min   Activity Tolerance Patient tolerated treatment well   Behavior During Therapy [facility] for tasks assessed/performed      Past Medical History:  Diagnosis Date  . Allergic rhinitis   . Anxiety 05/01/2011  . Atherosclerosis of aorta ([place])   . BPH (benign prostatic hyperplasia)   . CAD (coronary artery disease)   . Chronic prostatitis   . Diverticulitis   . DJD (degenerative joint disease), lumbosacral   . Hearing loss in right ear   . HLD (hyperlipidemia)   . HTN (hypertension)   . Inguinal hernia without mention of obstruction or gangrene, unilateral or unspecified, (not specified as recurrent)   . Ocular migraine   . Old myocardial infarction 1989  . PUD (peptic ulcer disease)   . Transient amnesia     Past Surgical History:  Procedure Laterality Date  . APPENDECTOMY    . CORONARY STENT PLACEMENT    . INGUINAL HERNIA REPAIR      There were no vitals filed for this visit.      Subjective Assessment - 03/26/17 1003    Subjective Pt states increased soreness, daily, at about 4 pm, when he sits down after his daily activities. He is using lumbar support, and reports sitting in neutral position on couch.    Patient Stated Goals Decreased pain    Currently in Pain? Yes   Pain Score 3    Pain Location Back   Pain Orientation Left   Pain Descriptors / Indicators Aching;Tightness   Pain Type Chronic pain   Aggravating Factors  Standing,  standing activities, IADLS,                          [facility] Adult PT Treatment/Exercise - 03/26/17 0001      Lumbar Exercises: Stretches   Passive Hamstring Stretch 3 reps;30 seconds   Passive Hamstring Stretch Limitations Seated in chair   Quadruped Mid Back Stretch 3 reps;30 seconds   Quadruped Mid Back Stretch Limitations Prayer stretch/Childs pose, Center, L, R     Lumbar Exercises: Standing   Other Standing Lumbar Exercises Standing lumbar side bending x20                 PT Education - 03/26/17 1023    Education provided Yes   Education Details On mechanics for ther ex,  Diagnosis, and probable causes of pain   Person(s) Educated Patient   Methods Explanation   Comprehension Need further instruction;Verbalized understanding          PT Short Term Goals - 03/15/17 1331      PT SHORT TERM GOAL #1   Title Pt to demo independence with initial HEP, with correct mechanics.    Time 2   Period Weeks   Status New   Target Date 03/29/17  PT Long Term Goals - 03/15/17 1332      PT LONG TERM GOAL #1   Title Pt to demo increased lumbar flexion ROM, to have minimal deficit ( at most) to improve ability for bending, squatting, and IADLS    Time 6   Period Weeks   Status New   Target Date 04/26/17     PT LONG TERM GOAL #2   Title Pt to demo decreased pain in L sided lumbar region, to 0-2/10 with activity and standing, to improve ability for community activities and ability for walking program   Time 6   Period Weeks   Status New   Target Date 04/26/17     PT LONG TERM GOAL #3   Title Pt to demo independence for long term HEP, for stretching and mobility, with correct mechanics, to improve pain and decrease risk for re-injury.    Time 6   Period Weeks   Status New   Target Date 04/26/17               Plan - 03/26/17 1405    Clinical Impression Statement Pt given specific instruction and education on ther ex at each visit,  for correct mechanics and performance. He has noted increas in back pain with attempts for hip extension on L. Hip mobility addressed today, and pt to benefit from continued work on hip extension ROM., to allow for ambulation and hip extension without pain in back. Pt may also benefit from dry needling at L lumbar musculature for decreasing tightness.    Rehab Potential Good   PT Frequency 2x / week   PT Duration 6 weeks   PT Treatment/Interventions ADLs/Self Care Home Management;Cryotherapy;Electrical Stimulation;Moist Heat;Ultrasound;Neuromuscular re-education;Therapeutic exercise;Therapeutic activities;Functional mobility training;Stair training;Gait training;Patient/family education;Manual techniques;Passive range of motion;Taping;Dry needling   PT Next Visit Plan Review, progress HEP, manual for lumbar stiffness    PT Home Exercise Plan SKTC/DKTC; seated HSS;  Seated piriformis;  supine hip IR; seated fwd flexion w Ball;  Quadruped/Childs pose; Prone quad stretch   Consulted and Agree with Plan of Care Patient      Patient will benefit from skilled therapeutic intervention in order to improve the following deficits and impairments:  Abnormal gait, Decreased activity tolerance, Decreased endurance, Hypomobility, Decreased strength, Pain, Increased muscle spasms, Decreased range of motion, Impaired flexibility  Visit Diagnosis: Chronic left-sided low back pain without sciatica     Problem List Patient Active Problem List   Diagnosis Date Noted  . Degenerative disc disease, lumbar 10/18/2016  . Nonallopathic lesion of lumbosacral region 10/18/2016  . Nonallopathic lesion of sacral region 10/18/2016  . Nonallopathic lesion of thoracic region 10/18/2016  . Insomnia 09/28/2016  . Transaminitis 12/29/2015  . GERD (gastroesophageal reflux disease) 12/29/2015  . Atherosclerotic peripheral vascular disease ([place]) 11/23/2015  . Chronic meniscal tear of knee 10/12/2015  . Left hip flexor  tightness 05/13/2015  . Strain of adductor magnus muscle of left lower extremity 04/06/2014  . Chronic pain of left groin 03/19/2014  . Abdominal pain, other specified site 06/11/2012  . Left groin pain 12/11/2011  . Conjunctivitis, acute, bilateral 09/05/2011  . Anxiety 05/01/2011  . Bladder neck obstruction 05/01/2011  . Preventative health care 04/30/2011  . MYOCARDIAL PERFUSION SCAN, WITH STRESS TEST, ABNORMAL 06/02/2010  . Chest pain 05/20/2010  . FATIGUE 04/27/2010  . OCULAR MIGRAINE 11/18/2009  . AMNESIA, TRANSIENT GLOBAL 11/18/2009  . Unspecified transient cerebral ischemia 04/06/2009  . HEARING LOSS, RIGHT EAR 03/19/2009  . Atherosclerosis of aorta (  [place]) 03/19/2009  . FROZEN RIGHT SHOULDER 03/19/2009  . INGUINAL HERNIA, RIGHT 08/29/2007  . EPIGASTRIC PAIN 08/29/2007  . MYOCARDIAL INFARCTION, HX OF 07/13/2007  . Hyperlipemia 03/06/2007  . Essential hypertension 03/06/2007  . CORONARY ARTERY DISEASE 03/06/2007  . Allergic rhinitis 03/06/2007  . PEPTIC ULCER DISEASE 03/06/2007  . BENIGN PROSTATIC HYPERTROPHY 03/06/2007  . PROSTATITIS, CHRONIC 03/06/2007  . DIVERTICULITIS, HX OF 03/06/2007   Lyndee Hensen, PT, DPT 2:09 PM  03/26/17    [facility] [place], [place], [address] Phone: [phone]   Fax:  [phone]  Name: [name] [name] MRN: [id number] Date of Birth: May 14, 1946

## 2017-03-30 ENCOUNTER — Ambulatory Visit (INDEPENDENT_AMBULATORY_CARE_PROVIDER_SITE_OTHER): Payer: Medicare Other | Admitting: Physical Therapy

## 2017-03-30 ENCOUNTER — Encounter: Payer: Self-pay | Admitting: Physical Therapy

## 2017-03-30 DIAGNOSIS — G8929 Other chronic pain: Secondary | ICD-10-CM | POA: Diagnosis not present

## 2017-03-30 DIAGNOSIS — M545 Low back pain, unspecified: Secondary | ICD-10-CM

## 2017-03-30 NOTE — Therapy (Addendum)
[facility] [address] [place], [place], [address] Phone: [phone]   Fax:  [phone]  Physical Therapy Treatment/Discharge  Patient Details  Name: [name] [name] MRN: [id number] Date of Birth: [birth year] Referring Provider: Charlann Boxer  Encounter Date: 03/30/2017      PT End of Session - 03/30/17 1157    Visit Number 5   Number of Visits 12   Date for PT Re-Evaluation 04/26/17   Authorization Type Medicare / G -Codes    PT Start Time 1100   PT Stop Time 1146   PT Time Calculation (min) 46 min   Activity Tolerance Patient tolerated treatment well   Behavior During Therapy [facility] for tasks assessed/performed      Past Medical History:  Diagnosis Date  . Allergic rhinitis   . Anxiety 05/01/2011  . Atherosclerosis of aorta ([place])   . BPH (benign prostatic hyperplasia)   . CAD (coronary artery disease)   . Chronic prostatitis   . Diverticulitis   . DJD (degenerative joint disease), lumbosacral   . Hearing loss in right ear   . HLD (hyperlipidemia)   . HTN (hypertension)   . Inguinal hernia without mention of obstruction or gangrene, unilateral or unspecified, (not specified as recurrent)   . Ocular migraine   . Old myocardial infarction 1989  . PUD (peptic ulcer disease)   . Transient amnesia     Past Surgical History:  Procedure Laterality Date  . APPENDECTOMY    . CORONARY STENT PLACEMENT    . INGUINAL HERNIA REPAIR      There were no vitals filed for this visit.      Subjective Assessment - 03/30/17 1154    Subjective Pt states that he was pain free for about 2 days after last session, he was on vacation, did do a lot of walking, but did not do much stretching. He has increased pain today when he woke up, and also walked on t-mill today as well.    Currently in Pain? Yes   Pain Score 2    Pain Location Back   Pain Orientation Left   Pain Descriptors / Indicators Aching;Tightness   Pain Type Chronic pain   Pain  Onset More than a month ago   Pain Frequency Intermittent                         [facility] Adult PT Treatment/Exercise - 03/30/17 1126      Lumbar Exercises: Stretches   Passive Hamstring Stretch 3 reps;30 seconds   Passive Hamstring Stretch Limitations Seated in chair   Quadruped Mid Back Stretch --   Quadruped Mid Back Stretch Limitations --     Lumbar Exercises: Standing   Other Standing Lumbar Exercises Standing lumbar side bending x20    Other Standing Lumbar Exercises Standing lumbar Ext x20     Lumbar Exercises: Supine   Other Supine Lumbar Exercises Pelvic Tilts x20     Lumbar Exercises: Prone   Other Prone Lumbar Exercises Prone hip ext x20      Knee/Hip Exercises: Standing   Hip Extension 3 sets;5 sets;Both     Manual Therapy   Manual Therapy Joint mobilization;Soft tissue mobilization;Other (comment)   Joint Mobilization lumbar PA mobs, anterior hip mobs, grade 3   Soft tissue mobilization DTM L lumbar paraspinals,    Other Manual Therapy Prone quad stretch, and hip extension PROM  PT Education - 03/30/17 1156    Education provided Yes   Education Details Education on plan for PT, ther ex mechanics, and likely causes of pain    Person(s) Educated Patient   Methods Explanation;Demonstration;Verbal cues;Tactile cues   Comprehension Verbalized understanding          PT Short Term Goals - 03/15/17 1331      PT SHORT TERM GOAL #1   Title Pt to demo independence with initial HEP, with correct mechanics.    Time 2   Period Weeks   Status New   Target Date 03/29/17           PT Long Term Goals - 03/15/17 1332      PT LONG TERM GOAL #1   Title Pt to demo increased lumbar flexion ROM, to have minimal deficit ( at most) to improve ability for bending, squatting, and IADLS    Time 6   Period Weeks   Status New   Target Date 04/26/17     PT LONG TERM GOAL #2   Title Pt to demo decreased pain in L sided lumbar  region, to 0-2/10 with activity and standing, to improve ability for community activities and ability for walking program   Time 6   Period Weeks   Status New   Target Date 04/26/17     PT LONG TERM GOAL #3   Title Pt to demo independence for long term HEP, for stretching and mobility, with correct mechanics, to improve pain and decrease risk for re-injury.    Time 6   Period Weeks   Status New   Target Date 04/26/17        Late Entry G-Code:  Functional Assessment Tool Used: clinical judgement Functional Limitation: Mobility: Walking and Moving Around Goal Status: CI: At least 1 but less than 20 percent impaired, limited, or restricted D/C Status: CI: At least 1 but less than 20 percent impaired, limited, or restricted    Laureen Abrahams, PT, DPT 05/01/17 11:47 AM         Plan - 03/30/17 1158    Clinical Impression Statement Pt with poor glute activation on L vs R. Pt with education and practice done with this today. Pt with increased pain in L SI when not activating glute, with AROM and PROM. Plan to continue hip mobility, stretching, and strengthening.    PT Treatment/Interventions ADLs/Self Care Home Management;Cryotherapy;Electrical Stimulation;Moist Heat;Ultrasound;Neuromuscular re-education;Therapeutic exercise;Therapeutic activities;Functional mobility training;Stair training;Gait training;Patient/family education;Manual techniques;Passive range of motion;Taping;Dry needling   PT Next Visit Plan hip extension ROM, glute strength,  lumbar ROM.    PT Home Exercise Plan SKTC/DKTC; seated HSS;  Seated piriformis;  supine hip IR; seated fwd flexion w Ball;  Quadruped/Childs pose; Prone quad stretch      Patient will benefit from skilled therapeutic intervention in order to improve the following deficits and impairments:  Abnormal gait, Decreased activity tolerance, Decreased endurance, Hypomobility, Decreased strength, Pain, Increased muscle spasms, Decreased range of  motion, Impaired flexibility  Visit Diagnosis: Chronic left-sided low back pain without sciatica     Problem List Patient Active Problem List   Diagnosis Date Noted  . Degenerative disc disease, lumbar 10/18/2016  . Nonallopathic lesion of lumbosacral region 10/18/2016  . Nonallopathic lesion of sacral region 10/18/2016  . Nonallopathic lesion of thoracic region 10/18/2016  . Insomnia 09/28/2016  . Transaminitis 12/29/2015  . GERD (gastroesophageal reflux disease) 12/29/2015  . Atherosclerotic peripheral vascular disease ([place]) 11/23/2015  . Chronic meniscal tear of  knee 10/12/2015  . Left hip flexor tightness 05/13/2015  . Strain of adductor magnus muscle of left lower extremity 04/06/2014  . Chronic pain of left groin 03/19/2014  . Abdominal pain, other specified site 06/11/2012  . Left groin pain 12/11/2011  . Conjunctivitis, acute, bilateral 09/05/2011  . Anxiety 05/01/2011  . Bladder neck obstruction 05/01/2011  . Preventative health care 04/30/2011  . MYOCARDIAL PERFUSION SCAN, WITH STRESS TEST, ABNORMAL 06/02/2010  . Chest pain 05/20/2010  . FATIGUE 04/27/2010  . OCULAR MIGRAINE 11/18/2009  . AMNESIA, TRANSIENT GLOBAL 11/18/2009  . Unspecified transient cerebral ischemia 04/06/2009  . HEARING LOSS, RIGHT EAR 03/19/2009  . Atherosclerosis of aorta ([place]) 03/19/2009  . FROZEN RIGHT SHOULDER 03/19/2009  . INGUINAL HERNIA, RIGHT 08/29/2007  . EPIGASTRIC PAIN 08/29/2007  . MYOCARDIAL INFARCTION, HX OF 07/13/2007  . Hyperlipemia 03/06/2007  . Essential hypertension 03/06/2007  . CORONARY ARTERY DISEASE 03/06/2007  . Allergic rhinitis 03/06/2007  . PEPTIC ULCER DISEASE 03/06/2007  . BENIGN PROSTATIC HYPERTROPHY 03/06/2007  . PROSTATITIS, CHRONIC 03/06/2007  . DIVERTICULITIS, HX OF 03/06/2007   Lyndee Hensen, PT, DPT 12:02 PM  03/30/17    [facility] [place], [place], [address] Phone: [phone]   Fax:   [phone]  Name: [name] [name] MRN: [id number] Date of Birth: 07/19/1945           PHYSICAL THERAPY DISCHARGE SUMMARY  Visits from Start of Care: 5  Current functional level related to goals / functional outcomes: See above   Remaining deficits: Unknown; pt did not reschedule   Education / Equipment: HEP  Plan: Patient agrees to discharge.  Patient goals were not met. Patient is being discharged due to not returning since the last visit.  ?????     Laureen Abrahams, PT, DPT 05/01/17 11:48 AM   [facility] [address] [place], [place], [address] Phone: [phone]  Fax: [phone]

## 2017-04-01 ENCOUNTER — Other Ambulatory Visit: Payer: Self-pay | Admitting: Internal Medicine

## 2017-04-18 ENCOUNTER — Encounter: Payer: Self-pay | Admitting: Family Medicine

## 2017-04-18 ENCOUNTER — Ambulatory Visit (INDEPENDENT_AMBULATORY_CARE_PROVIDER_SITE_OTHER): Payer: Medicare Other | Admitting: Family Medicine

## 2017-04-18 DIAGNOSIS — M5136 Other intervertebral disc degeneration, lumbar region: Secondary | ICD-10-CM | POA: Diagnosis not present

## 2017-04-18 NOTE — Progress Notes (Signed)
[name] [address] [place], [place] [address] Phone: [phone] Subjective:     CC: Back pain  LYY:[id number]  [name] [name] is a 71 y.o. male coming in for follow up for lower back pain. His back continues to bother him despite going to physical therapy recently. He notices that his pain is elicited by hip flexor stretches. He wants to know what the next step is for him. Patient has become frustrated. Has seen multiple providers for this previously. Patient has had MRIs and epidurals in the past and does not want a repeat procedure.       Past Medical History:  Diagnosis Date  . Allergic rhinitis   . Anxiety 05/01/2011  . Atherosclerosis of aorta ([place])   . BPH (benign prostatic hyperplasia)   . CAD (coronary artery disease)   . Chronic prostatitis   . Diverticulitis   . DJD (degenerative joint disease), lumbosacral   . Hearing loss in right ear   . HLD (hyperlipidemia)   . HTN (hypertension)   . Inguinal hernia without mention of obstruction or gangrene, unilateral or unspecified, (not specified as recurrent)   . Ocular migraine   . Old myocardial infarction 1989  . PUD (peptic ulcer disease)   . Transient amnesia    Past Surgical History:  Procedure Laterality Date  . APPENDECTOMY    . CORONARY STENT PLACEMENT    . INGUINAL HERNIA REPAIR     Social History   Social History  . Marital status: Married    Spouse name: N/A  . Number of children: 3  . Years of education: N/A   Occupational History  . retired    Social History Main Topics  . Smoking status: Former Smoker    Quit date: 07/03/1980  . Smokeless tobacco: Never Used  . Alcohol use 0.0 oz/week     Comment: a few beers or glasses of wine a week prior to dinner  . Drug use: No  . Sexual activity: Not Asked   Other Topics Concern  . None   Social History Narrative  . None   Allergies  Allergen Reactions  . Niacin     Hot flashes  . Other Other (See Comments)   Environmental allergies/ post nasal drip  . Penicillins     As child: muscles stiffened   Family History  Problem Relation Age of Onset  . Alcohol abuse Father   . Cancer Father   . Alcohol abuse Brother   . Cancer Unknown        grandfather  . Hypertension Unknown        grandfather  . Heart disease Unknown        grandfather  . Colon cancer Neg Hx   . Stomach cancer Neg Hx      Past medical history, social, surgical and family history all reviewed in electronic medical record.  No pertanent information unless stated regarding to the chief complaint.   Review of Systems:Review of systems updated and as accurate as of 04/18/17  No headache, visual changes, nausea, vomiting, diarrhea, constipation, dizziness, abdominal pain, skin rash, fevers, chills, night sweats, weight loss, swollen lymph nodes, body aches, joint swelling chest pain, shortness of breath, mood changes.  Positive muscle aches  Objective  Blood pressure 120/76, pulse 75, height 5' 7.5" (1.715 m), weight 150 lb (68 kg), SpO2 97 %. Systems examined below as of 04/18/17   General: No apparent distress alert and oriented x3 mood and  affect normal, dressed appropriately.  HEENT: Pupils equal, extraocular movements intact  Respiratory: Patient's speak in full sentences and does not appear short of breath  Cardiovascular: No lower extremity edema, non tender, no erythema  Skin: Warm dry intact with no signs of infection or rash on extremities or on axial skeleton.  Abdomen: Soft nontender  Neuro: Cranial nerves II through XII are intact, neurovascularly intact in all extremities with 2+ DTRs and 2+ pulses.  Lymph: No lymphadenopathy of posterior or anterior cervical chain or axillae bilaterally.  Gait normal with good balance and coordination.  MSK:  Mild positive tender with full range of motion and good stability and symmetric strength and tone of shoulders, elbows, wrist, hip, knee and ankles bilaterally.  Back  exam shows the patient does have loss of lordosis. In significant discomfort and still in the paraspinal musculature. Negative straight leg test. Positive Faber test bilaterally. Neurovascular intact distally with full strength.    Impression and Recommendations:     This case required medical decision making of moderate complexity.      Note: This dictation was prepared with Dragon dictation along with smaller phrase technology. Any transcriptional errors that result from this process are unintentional.

## 2017-04-18 NOTE — Assessment & Plan Note (Signed)
Spent  25 minutes with patient face-to-face and had greater than 50% of counseling including as described in assessment and plan.No degenerative disc disease. I do feel that possibly advance imaging would be warranted. Patient is frustrated with a different exercises and we decreased the amount and hopefully this will be more beneficial. We discussed with patient about other different medications. Discussed the possibility of laboratory workup which patient declined. Patient will continue with this other conservative therapy and see me again in 4-6 weeks.

## 2017-04-18 NOTE — Patient Instructions (Signed)
Sorry for the delay  [name] [name] is your friend.  5 exercises now.  OK to do the biking and alternate with the treadmill  Continue the vitamin D and given the iron one more try  See me again in 6 weeks.

## 2017-04-27 ENCOUNTER — Other Ambulatory Visit: Payer: Self-pay | Admitting: Internal Medicine

## 2017-05-27 ENCOUNTER — Other Ambulatory Visit: Payer: Self-pay | Admitting: Internal Medicine

## 2017-05-28 ENCOUNTER — Telehealth: Payer: Self-pay | Admitting: Internal Medicine

## 2017-05-29 NOTE — Progress Notes (Signed)
[name] [address] [place], [place] [address] Phone: [phone] Subjective:    I'm seeing this patient by the request  of:    CC: Back pain follow up   ONG:[id number]  [name] [name] is a 71 y.o. male coming in with complaint of back pain.  he does have known degenerative disc disease.  Patient has been doing relatively well with conservative therapy.  Continues to have pain off and on but nothing that is severe.  Not stopping him from any activities.  Patient is increasing activity slowly and is working out on a regular basis.      Past Medical History:  Diagnosis Date  . Allergic rhinitis   . Anxiety 05/01/2011  . Atherosclerosis of aorta ([place])   . BPH (benign prostatic hyperplasia)   . CAD (coronary artery disease)   . Chronic prostatitis   . Diverticulitis   . DJD (degenerative joint disease), lumbosacral   . Hearing loss in right ear   . HLD (hyperlipidemia)   . HTN (hypertension)   . Inguinal hernia without mention of obstruction or gangrene, unilateral or unspecified, (not specified as recurrent)   . Ocular migraine   . Old myocardial infarction 1989  . PUD (peptic ulcer disease)   . Transient amnesia    Past Surgical History:  Procedure Laterality Date  . APPENDECTOMY    . CORONARY STENT PLACEMENT    . INGUINAL HERNIA REPAIR     Social History   Socioeconomic History  . Marital status: Married    Spouse name: Not on file  . Number of children: 3  . Years of education: Not on file  . Highest education level: Not on file  Social Needs  . Financial resource strain: Not on file  . Food insecurity - worry: Not on file  . Food insecurity - inability: Not on file  . Transportation needs - medical: Not on file  . Transportation needs - non-medical: Not on file  Occupational History  . Occupation: retired  Tobacco Use  . Smoking status: Former Smoker    Last attempt to quit: 07/03/1980    Years since quitting: 36.9  .  Smokeless tobacco: Never Used  Substance and Sexual Activity  . Alcohol use: Yes    Alcohol/week: 0.0 oz    Comment: a few beers or glasses of wine a week prior to dinner  . Drug use: No  . Sexual activity: Not on file  Other Topics Concern  . Not on file  Social History Narrative  . Not on file   Allergies  Allergen Reactions  . Niacin     Hot flashes  . Other Other (See Comments)    Environmental allergies/ post nasal drip  . Penicillins     As child: muscles stiffened   Family History  Problem Relation Age of Onset  . Alcohol abuse Father   . Cancer Father   . Alcohol abuse Brother   . Cancer Unknown        grandfather  . Hypertension Unknown        grandfather  . Heart disease Unknown        grandfather  . Colon cancer Neg Hx   . Stomach cancer Neg Hx      Past medical history, social, surgical and family history all reviewed in electronic medical record.  No pertanent information unless stated regarding to the chief complaint.   Review of Systems:Review of systems updated and as accurate  as of 05/29/17  No headache, visual changes, nausea, vomiting, diarrhea, constipation, dizziness, abdominal pain, skin rash, fevers, chills, night sweats, weight loss, swollen lymph nodes, body aches, joint swelling, muscle aches, chest pain, shortness of breath, mood changes.   Objective  There were no vitals taken for this visit. Systems examined below as of 05/29/17   General: No apparent distress alert and oriented x3 mood and affect normal, dressed appropriately.  HEENT: Pupils equal, extraocular movements intact  Respiratory: Patient's speak in full sentences and does not appear short of breath  Cardiovascular: No lower extremity edema, non tender, no erythema  Skin: Warm dry intact with no signs of infection or rash on extremities or on axial skeleton.  Abdomen: Soft nontender  Neuro: Cranial nerves II through XII are intact, neurovascularly intact in all extremities  with 2+ DTRs and 2+ pulses.  Lymph: No lymphadenopathy of posterior or anterior cervical chain or axillae bilaterally.  Gait normal with good balance and coordination.  MSK:  Non tender with full range of motion and good stability and symmetric strength and tone of shoulders, elbows, wrist, hip, knee and ankles bilaterally.  Back Exam:  Inspection: Mild loss of lordosis Motion: Flexion 35 deg, Extension 25 deg, Side Bending to 35 deg bilaterally,  Rotation to 45 deg bilaterally  SLR laying: Negative  XSLR laying: Negative  Palpable tenderness: Mild discomfort in the paraspinal musculature.Marland Kitchen FABER: Tightness bilaterally. Sensory change: Gross sensation intact to all lumbar and sacral dermatomes.  Reflexes: 2+ at both patellar tendons, 2+ at achilles tendons, Babinski's downgoing.  Strength at foot  Plantar-flexion: 5/5 Dorsi-flexion: 5/5 Eversion: 5/5 Inversion: 5/5  Leg strength  Quad: 5/5 Hamstring: 5/5 Hip flexor: 5/5 Hip abductors: 5/5  Gait unremarkable.      Impression and Recommendations:     This case required medical decision making of moderate complexity.      Note: This dictation was prepared with Dragon dictation along with smaller phrase technology. Any transcriptional errors that result from this process are unintentional.

## 2017-05-30 ENCOUNTER — Ambulatory Visit (INDEPENDENT_AMBULATORY_CARE_PROVIDER_SITE_OTHER): Payer: Medicare Other | Admitting: Family Medicine

## 2017-05-30 ENCOUNTER — Encounter: Payer: Self-pay | Admitting: Family Medicine

## 2017-05-30 DIAGNOSIS — M5136 Other intervertebral disc degeneration, lumbar region: Secondary | ICD-10-CM

## 2017-05-30 NOTE — Telephone Encounter (Signed)
Patient had his yearly in March 2018. On his AVS Dr. Jenny Reichmann said he did not need to return for another year. He has set up his yearly CPE for April 2019.

## 2017-05-30 NOTE — Assessment & Plan Note (Signed)
Patient is stable with conservative therapy.  Does not want any osteopathic manipulation or any other significant changes. The patient feels like he is doing well can follow-up as needed

## 2017-05-30 NOTE — Patient Instructions (Signed)
Good to see you  I am impressed  Keep it up  If you need [place] call [phone] Happy holidays!

## 2017-06-11 ENCOUNTER — Other Ambulatory Visit: Payer: Self-pay | Admitting: Internal Medicine

## 2017-06-12 NOTE — Telephone Encounter (Signed)
Refill done.  

## 2017-06-16 ENCOUNTER — Other Ambulatory Visit: Payer: Self-pay | Admitting: Internal Medicine

## 2017-06-16 ENCOUNTER — Encounter: Payer: Self-pay | Admitting: Internal Medicine

## 2017-06-18 ENCOUNTER — Other Ambulatory Visit: Payer: Self-pay | Admitting: Internal Medicine

## 2017-06-18 NOTE — Telephone Encounter (Signed)
Copied from [place] [id number]. Topic: Quick Communication - See Telephone Encounter >> Jun 18, 2017 12:45 PM Percell Belt A wrote: CRM for notification. See Telephone encounter for:  pt called in and said that the  atorvastatin (LIPITOR) 20 MG tablet [[id number]]  is not at the [facility].  Pt checked just a few mins ago.  Can this med be resent to the pharmacy?    Pharmacy- [facility] on [address] on file   06/18/17.

## 2017-06-19 ENCOUNTER — Encounter: Payer: Self-pay | Admitting: Internal Medicine

## 2017-06-19 MED ORDER — ATORVASTATIN CALCIUM 20 MG PO TABS: ORAL_TABLET | ORAL | 0 refills | Status: DC

## 2017-06-25 ENCOUNTER — Other Ambulatory Visit: Payer: Self-pay | Admitting: Internal Medicine

## 2017-06-28 ENCOUNTER — Other Ambulatory Visit: Payer: Self-pay | Admitting: Internal Medicine

## 2017-07-23 ENCOUNTER — Other Ambulatory Visit: Payer: Self-pay | Admitting: Internal Medicine

## 2017-08-05 ENCOUNTER — Other Ambulatory Visit: Payer: Self-pay | Admitting: Internal Medicine

## 2017-08-14 DIAGNOSIS — L570 Actinic keratosis: Secondary | ICD-10-CM | POA: Diagnosis not present

## 2017-08-14 DIAGNOSIS — D485 Neoplasm of uncertain behavior of skin: Secondary | ICD-10-CM | POA: Diagnosis not present

## 2017-08-14 DIAGNOSIS — L821 Other seborrheic keratosis: Secondary | ICD-10-CM | POA: Diagnosis not present

## 2017-08-14 DIAGNOSIS — Z08 Encounter for follow-up examination after completed treatment for malignant neoplasm: Secondary | ICD-10-CM | POA: Diagnosis not present

## 2017-08-14 DIAGNOSIS — L111 Transient acantholytic dermatosis [Grover]: Secondary | ICD-10-CM | POA: Diagnosis not present

## 2017-08-14 DIAGNOSIS — D225 Melanocytic nevi of trunk: Secondary | ICD-10-CM | POA: Diagnosis not present

## 2017-08-14 DIAGNOSIS — L2089 Other atopic dermatitis: Secondary | ICD-10-CM | POA: Diagnosis not present

## 2017-08-14 DIAGNOSIS — Z85828 Personal history of other malignant neoplasm of skin: Secondary | ICD-10-CM | POA: Diagnosis not present

## 2017-08-21 ENCOUNTER — Other Ambulatory Visit: Payer: Self-pay | Admitting: Internal Medicine

## 2017-09-03 ENCOUNTER — Other Ambulatory Visit: Payer: Self-pay | Admitting: Internal Medicine

## 2017-09-24 ENCOUNTER — Other Ambulatory Visit: Payer: Self-pay | Admitting: Internal Medicine

## 2017-10-02 ENCOUNTER — Ambulatory Visit (INDEPENDENT_AMBULATORY_CARE_PROVIDER_SITE_OTHER): Payer: Medicare Other | Admitting: Internal Medicine

## 2017-10-02 ENCOUNTER — Encounter: Payer: Self-pay | Admitting: Internal Medicine

## 2017-10-02 ENCOUNTER — Other Ambulatory Visit (INDEPENDENT_AMBULATORY_CARE_PROVIDER_SITE_OTHER): Payer: Medicare Other

## 2017-10-02 VITALS — BP 124/76 | HR 71 | Temp 97.8°F | Ht 67.0 in | Wt 156.0 lb

## 2017-10-02 DIAGNOSIS — F419 Anxiety disorder, unspecified: Secondary | ICD-10-CM

## 2017-10-02 DIAGNOSIS — N320 Bladder-neck obstruction: Secondary | ICD-10-CM | POA: Diagnosis not present

## 2017-10-02 DIAGNOSIS — I10 Essential (primary) hypertension: Secondary | ICD-10-CM | POA: Diagnosis not present

## 2017-10-02 DIAGNOSIS — E785 Hyperlipidemia, unspecified: Secondary | ICD-10-CM

## 2017-10-02 DIAGNOSIS — Z955 Presence of coronary angioplasty implant and graft: Secondary | ICD-10-CM | POA: Insufficient documentation

## 2017-10-02 LAB — CBC WITH DIFFERENTIAL/PLATELET
BASOS ABS: 0 10*3/uL (ref 0.0–0.1)
Basophils Relative: 0.4 % (ref 0.0–3.0)
EOS PCT: 0.8 % (ref 0.0–5.0)
Eosinophils Absolute: 0 10*3/uL (ref 0.0–0.7)
HCT: 44.5 % (ref 39.0–52.0)
HEMOGLOBIN: 15.3 g/dL (ref 13.0–17.0)
LYMPHS ABS: 1.5 10*3/uL (ref 0.7–4.0)
Lymphocytes Relative: 25.5 % (ref 12.0–46.0)
MCHC: 34.4 g/dL (ref 30.0–36.0)
MCV: 92.9 fl (ref 78.0–100.0)
MONOS PCT: 12.9 % — AB (ref 3.0–12.0)
Monocytes Absolute: 0.8 10*3/uL (ref 0.1–1.0)
NEUTROS PCT: 60.4 % (ref 43.0–77.0)
Neutro Abs: 3.6 10*3/uL (ref 1.4–7.7)
Platelets: 199 10*3/uL (ref 150.0–400.0)
RBC: 4.79 Mil/uL (ref 4.22–5.81)
RDW: 12.9 % (ref 11.5–15.5)
WBC: 5.9 10*3/uL (ref 4.0–10.5)

## 2017-10-02 LAB — BASIC METABOLIC PANEL
BUN: 14 mg/dL (ref 6–23)
CALCIUM: 9.3 mg/dL (ref 8.4–10.5)
CO2: 30 meq/L (ref 19–32)
CREATININE: 1.02 mg/dL (ref 0.40–1.50)
Chloride: 104 mEq/L (ref 96–112)
GFR: 75.97 mL/min (ref >60.00)
GLUCOSE: 104 mg/dL — AB (ref 70–99)
Potassium: 4.1 mEq/L (ref 3.5–5.1)
Sodium: 140 mEq/L (ref 135–145)

## 2017-10-02 LAB — HEPATIC FUNCTION PANEL
ALBUMIN: 4 g/dL (ref 3.5–5.2)
ALK PHOS: 79 U/L (ref 39–117)
ALT: 29 U/L (ref 0–53)
AST: 28 U/L (ref 0–37)
Bilirubin, Direct: 0.1 mg/dL (ref 0.0–0.3)
Total Bilirubin: 0.6 mg/dL (ref 0.2–1.2)
Total Protein: 6.8 g/dL (ref 6.0–8.3)

## 2017-10-02 LAB — URINALYSIS, ROUTINE W REFLEX MICROSCOPIC
BILIRUBIN URINE: NEGATIVE
HGB URINE DIPSTICK: NEGATIVE
Ketones, ur: NEGATIVE
LEUKOCYTES UA: NEGATIVE
Nitrite: NEGATIVE
Specific Gravity, Urine: <=1.005 — AB (ref 1.000–1.030)
Total Protein, Urine: NEGATIVE
URINE GLUCOSE: NEGATIVE
UROBILINOGEN UA: 0.2 (ref 0.0–1.0)
WBC, UA: NONE SEEN — AB (ref 0–?)
pH: 6.5 (ref 5.0–8.0)

## 2017-10-02 LAB — LIPID PANEL
CHOL/HDL RATIO: 3
Cholesterol: 142 mg/dL (ref 0–200)
HDL: 43.5 mg/dL (ref >39.00)
LDL Cholesterol: 68 mg/dL (ref 0–99)
NONHDL: 98.92
TRIGLYCERIDES: 156 mg/dL — AB (ref 0.0–149.0)
VLDL: 31.2 mg/dL (ref 0.0–40.0)

## 2017-10-02 LAB — PSA: PSA: 1.64 ng/mL (ref 0.10–4.00)

## 2017-10-02 LAB — TSH: TSH: 2.73 u[IU]/mL (ref 0.35–4.50)

## 2017-10-02 MED ORDER — MONTELUKAST SODIUM 10 MG PO TABS: ORAL_TABLET | ORAL | 3 refills | Status: DC

## 2017-10-02 MED ORDER — RANITIDINE HCL 150 MG PO TABS: ORAL_TABLET | ORAL | 11 refills | Status: DC

## 2017-10-02 MED ORDER — ATORVASTATIN CALCIUM 20 MG PO TABS: ORAL_TABLET | ORAL | 3 refills | Status: DC

## 2017-10-02 MED ORDER — TIZANIDINE HCL 4 MG PO TABS: ORAL_TABLET | ORAL | 3 refills | Status: DC

## 2017-10-02 NOTE — Patient Instructions (Signed)

## 2017-10-02 NOTE — Progress Notes (Signed)
Subjective:    Patient ID: [name] [name], male    DOB: [birth year], 72 y.o.   MRN: [id number]  HPI  Here for yearly f/u;  Overall doing ok;  Pt denies Chest pain, worsening SOB, DOE, wheezing, orthopnea, PND, worsening LE edema, palpitations, dizziness or syncope.  Pt denies neurological change such as new headache, facial or extremity weakness.  Pt denies polydipsia, polyuria, or low sugar symptoms. Pt states overall good compliance with treatment and medications, good tolerability, and has been trying to follow appropriate diet.  Pt denies worsening depressive symptoms, suicidal ideation or panic. No fever, night sweats, wt loss, loss of appetite, or other constitutional symptoms.  Pt states good ability with ADL's, has low fall risk, home safety reviewed and adequate, no other significant changes in hearing or vision, and only occasionally active with exercise due to pain.  C/o bilat knee pain but still can walk 30 min on treadmill, just cant seem to do hills.   S/p coronary stent recent.  No other interval hx or new complaint Past Medical History:  Diagnosis Date  . Allergic rhinitis   . Anxiety 05/01/2011  . Atherosclerosis of aorta ([place])   . BPH (benign prostatic hyperplasia)   . CAD (coronary artery disease)   . Chronic prostatitis   . Diverticulitis   . DJD (degenerative joint disease), lumbosacral   . Hearing loss in right ear   . HLD (hyperlipidemia)   . HTN (hypertension)   . Inguinal hernia without mention of obstruction or gangrene, unilateral or unspecified, (not specified as recurrent)   . Ocular migraine   . Old myocardial infarction 1989  . PUD (peptic ulcer disease)   . Transient amnesia    Past Surgical History:  Procedure Laterality Date  . APPENDECTOMY    . CORONARY STENT PLACEMENT    . INGUINAL HERNIA REPAIR      reports that he quit smoking about 37 years ago. He has never used smokeless tobacco. He reports that he drinks alcohol. He reports that he does not use  drugs. family history includes Alcohol abuse in his brother and father; Cancer in his father and unknown relative; Heart disease in his unknown relative; Hypertension in his unknown relative. Allergies  Allergen Reactions  . Niacin     Hot flashes  . Other Other (See Comments)    Environmental allergies/ post nasal drip  . Penicillins     As child: muscles stiffened   Current Outpatient Medications on File Prior to Visit  Medication Sig Dispense Refill  . aspirin 81 MG tablet Take 81 mg by mouth daily.    . fluticasone (FLONASE) 50 MCG/ACT nasal spray Place 2 sprays into the nose daily as needed.    . montelukast (SINGULAIR) 10 MG tablet TAKE 1 TABLET(10 MG) BY MOUTH DAILY 90 tablet 1   No current facility-administered medications on file prior to visit.    Review of Systems Constitutional: Negative for other unusual diaphoresis, sweats, appetite or weight changes HENT: Negative for other worsening hearing loss, ear pain, facial swelling, mouth sores or neck stiffness.   Eyes: Negative for other worsening pain, redness or other visual disturbance.  Respiratory: Negative for other stridor or swelling Cardiovascular: Negative for other palpitations or other chest pain  Gastrointestinal: Negative for worsening diarrhea or loose stools, blood in stool, distention or other pain Genitourinary: Negative for hematuria, flank pain or other change in urine volume.  Musculoskeletal: Negative for myalgias or other joint swelling.  Skin: Negative for other  color change, or other wound or worsening drainage.  Neurological: Negative for other syncope or numbness. Hematological: Negative for other adenopathy or swelling Psychiatric/Behavioral: Negative for hallucinations, other worsening agitation, SI, self-injury, or new decreased concentration All other system neg per pt    Objective:   Physical Exam BP 124/76   Pulse 71   Temp 97.8 F (36.6 C) (Oral)   Ht 5\' 7"  (1.702 m)   Wt 156 lb (70.8  kg)   SpO2 98%   BMI 24.43 kg/m  VS noted,  Constitutional: Pt is oriented to person, place, and time. Appears well-developed and well-nourished, in no significant distress and comfortable Head: Normocephalic and atraumatic  Eyes: Conjunctivae and EOM are normal. Pupils are equal, round, and reactive to light Right Ear: External ear normal without discharge Left Ear: External ear normal without discharge Nose: Nose without discharge or deformity Mouth/Throat: Oropharynx is without other ulcerations and moist  Neck: Normal range of motion. Neck supple. No JVD present. No tracheal deviation present or significant neck LA or mass Cardiovascular: Normal rate, regular rhythm, normal heart sounds and intact distal pulses.   Pulmonary/Chest: WOB normal and breath sounds without rales or wheezing  Abdominal: Soft. Bowel sounds are normal. NT. No HSM  Musculoskeletal: Normal range of motion. Exhibits no edema Lymphadenopathy: Has no other cervical adenopathy.  Neurological: Pt is alert and oriented to person, place, and time. Pt has normal reflexes. No cranial nerve deficit. Motor grossly intact, Gait intact Skin: Skin is warm and dry. No rash noted or new ulcerations Psychiatric:  Has normal mood and affect. Behavior is normal without agitation; mild nervous     Assessment & Plan:

## 2017-10-05 ENCOUNTER — Encounter: Payer: Self-pay | Admitting: Internal Medicine

## 2017-10-05 MED ORDER — GABAPENTIN 100 MG PO CAPS: ORAL_CAPSULE | ORAL | 1 refills | Status: DC

## 2017-10-07 NOTE — Assessment & Plan Note (Signed)
Lab Results  Component Value Date   LDLCALC 68 10/02/2017  stable overall by history and exam, recent data reviewed with pt, and pt to continue medical treatment as before,  to f/u any worsening symptoms or concerns

## 2017-10-07 NOTE — Assessment & Plan Note (Signed)
Also for psa as he is due, asympt

## 2017-10-07 NOTE — Assessment & Plan Note (Signed)
stable overall by history and exam, recent data reviewed with pt, and pt to continue medical treatment as before,  to f/u any worsening symptoms or concerns BP Readings from Last 3 Encounters:  10/02/17 124/76  05/30/17 104/60  04/18/17 120/76

## 2017-10-07 NOTE — Assessment & Plan Note (Signed)
Stable, cont same tx 

## 2017-10-10 ENCOUNTER — Other Ambulatory Visit: Payer: Self-pay | Admitting: Internal Medicine

## 2017-10-26 ENCOUNTER — Encounter: Payer: Self-pay | Admitting: Family Medicine

## 2017-10-26 ENCOUNTER — Ambulatory Visit (INDEPENDENT_AMBULATORY_CARE_PROVIDER_SITE_OTHER): Payer: Medicare Other | Admitting: Family Medicine

## 2017-10-26 DIAGNOSIS — M5136 Other intervertebral disc degeneration, lumbar region: Secondary | ICD-10-CM

## 2017-10-26 NOTE — Patient Instructions (Signed)
Good to see you  [name] [name] is your friend.  New exercises 3 times a week  The knees have a lot of arthritis but we will watch it More biking instead of walking on treadmill would be good Continue good shoes Consider the vitamins again  For the stomach pain consider 2 weeks or prilosec daily  See me again in 2 months or sooner if worsening

## 2017-10-26 NOTE — Progress Notes (Signed)
[name] [address] [place], [place] [address] Phone: [phone] Subjective:     CC: Low back pain follow-up  OFB:[id number]  [name] [name] is a 72 y.o. male coming in with complaint of low back pain.  Has been seen for this previously.  Patient has been trying to stay home exercises and doing them intermittently.  We have just discussed multiple things such as advanced imaging which patient has declined.  Patient has had difficulty with iron supplementation and has also been noncompliant.  X-rays taken in 2018 that were independently visualized by me showing mild osteoarthritic changes and diffuse aortic calcifications.  We have discussed the possibility of ultrasound in the calcifications of the aorta which patient has declined.  Continues to have discomfort in the back radiating to hips. Sometimes feels like this can be associated with radiation coming around to the abdominal area.  More of a cramping sensation.  Does have some mild association with food.  Denies any nausea vomiting diarrhea or constipation.    Past Medical History:  Diagnosis Date  . Allergic rhinitis   . Anxiety 05/01/2011  . Atherosclerosis of aorta ([place])   . BPH (benign prostatic hyperplasia)   . CAD (coronary artery disease)   . Chronic prostatitis   . Diverticulitis   . DJD (degenerative joint disease), lumbosacral   . Hearing loss in right ear   . HLD (hyperlipidemia)   . HTN (hypertension)   . Inguinal hernia without mention of obstruction or gangrene, unilateral or unspecified, (not specified as recurrent)   . Ocular migraine   . Old myocardial infarction 1989  . PUD (peptic ulcer disease)   . Transient amnesia    Past Surgical History:  Procedure Laterality Date  . APPENDECTOMY    . CORONARY STENT PLACEMENT    . INGUINAL HERNIA REPAIR     Social History   Socioeconomic History  . Marital status: Married    Spouse name: Not on file  . Number of children: 3  .  Years of education: Not on file  . Highest education level: Not on file  Occupational History  . Occupation: retired  Scientific laboratory technician  . Financial resource strain: Not on file  . Food insecurity:    Worry: Not on file    Inability: Not on file  . Transportation needs:    Medical: Not on file    Non-medical: Not on file  Tobacco Use  . Smoking status: Former Smoker    Last attempt to quit: 07/03/1980    Years since quitting: 37.3  . Smokeless tobacco: Never Used  Substance and Sexual Activity  . Alcohol use: Yes    Alcohol/week: 0.0 oz    Comment: a few beers or glasses of wine a week prior to dinner  . Drug use: No  . Sexual activity: Not on file  Lifestyle  . Physical activity:    Days per week: Not on file    Minutes per session: Not on file  . Stress: Not on file  Relationships  . Social connections:    Talks on phone: Not on file    Gets together: Not on file    Attends religious service: Not on file    Active member of club or organization: Not on file    Attends meetings of clubs or organizations: Not on file    Relationship status: Not on file  Other Topics Concern  . Not on file  Social History Narrative  .  Not on file   Allergies  Allergen Reactions  . Niacin     Hot flashes  . Other Other (See Comments)    Environmental allergies/ post nasal drip  . Penicillins     As child: muscles stiffened   Family History  Problem Relation Age of Onset  . Alcohol abuse Father   . Cancer Father   . Alcohol abuse Brother   . Cancer Unknown        grandfather  . Hypertension Unknown        grandfather  . Heart disease Unknown        grandfather  . Colon cancer Neg Hx   . Stomach cancer Neg Hx      Past medical history, social, surgical and family history all reviewed in electronic medical record.  No pertanent information unless stated regarding to the chief complaint.   Review of Systems:Review of systems updated and as accurate as of 10/26/17  No headache,  visual changes, nausea, vomiting, diarrhea, constipation, dizziness, abdominal pain, skin rash, fevers, chills, night sweats, weight loss, swollen lymph nodes,  joint swelling, chest pain, shortness of breath, mood changes.  Positive muscle aches body aches  Objective  .Blood pressure 124/70, pulse 91, height 5\' 7"  (1.702 m), weight 157 lb (71.2 kg), SpO2 98 %.  Systems examined below as of 10/26/17   General: No apparent distress alert and oriented x3 mood and affect normal, dressed appropriately.  HEENT: Pupils equal, extraocular movements intact  Respiratory: Patient's speak in full sentences and does not appear short of breath  Cardiovascular: No lower extremity edema, non tender, no erythema  Skin: Warm dry intact with no signs of infection or rash on extremities or on axial skeleton.  Abdomen: Soft nontender  Neuro: Cranial nerves II through XII are intact, neurovascularly intact in all extremities with 2+ DTRs and 2+ pulses.  Lymph: No lymphadenopathy of posterior or anterior cervical chain or axillae bilaterally.  Gait normal with good balance and coordination.  MSK: Mild tender with full range of motion and good stability and symmetric strength and tone of shoulders, elbows, wrist, hip, and ankles bilaterally.   Knee still shows mild valgus deformity noted. Back exam shows loss of lordosis.  Patient has limited range of motion especially with extension by 10 degrees.  Mild positive increased tenderness with Corky Sox test bilaterally.  Mild atrophy of the thigh as compared to previous exam.  4+ out of 5 strength with symmetric the lower extremities and deep tendon reflexes intact.    Impression and Recommendations:     This case required medical decision making of moderate complexity.      Note: This dictation was prepared with Dragon dictation along with smaller phrase technology. Any transcriptional errors that result from this process are unintentional.

## 2017-10-27 NOTE — Assessment & Plan Note (Signed)
Spent  25 minutes with patient face-to-face and had greater than 50% of counseling including as described in assessment and plan.  Discussed with patient about degenerative disc disease.  We discussed that the differential including spinal stenosis.  We discussed some of the radicular symptoms.  Has been given gabapentin past and patient has not been taking it regularly.  We discussed that this can be titrated up and may be more beneficial.  Concern for some of the atrophy in the lower extremities.  Patient has not been working out the regularly.  Patient declined formal physical therapy.  Given home exercises previously and encouraged him to do them on a more regular basis.  Topical anti-inflammatories given to try.  Discussed icing regimen.  Discussed the possible need for advanced imaging in the future.  Await up again in 2 to 3 months

## 2017-11-15 DIAGNOSIS — R1012 Left upper quadrant pain: Secondary | ICD-10-CM | POA: Diagnosis not present

## 2017-11-23 DIAGNOSIS — K2960 Other gastritis without bleeding: Secondary | ICD-10-CM | POA: Diagnosis not present

## 2017-11-23 DIAGNOSIS — K2930 Chronic superficial gastritis without bleeding: Secondary | ICD-10-CM | POA: Diagnosis not present

## 2017-11-23 DIAGNOSIS — K2950 Unspecified chronic gastritis without bleeding: Secondary | ICD-10-CM | POA: Diagnosis not present

## 2017-11-23 DIAGNOSIS — R1012 Left upper quadrant pain: Secondary | ICD-10-CM | POA: Diagnosis not present

## 2017-11-30 ENCOUNTER — Other Ambulatory Visit: Payer: Self-pay | Admitting: Internal Medicine

## 2017-12-05 DIAGNOSIS — M1711 Unilateral primary osteoarthritis, right knee: Secondary | ICD-10-CM | POA: Diagnosis not present

## 2017-12-05 DIAGNOSIS — M25561 Pain in right knee: Secondary | ICD-10-CM | POA: Diagnosis not present

## 2017-12-05 DIAGNOSIS — M659 Synovitis and tenosynovitis, unspecified: Secondary | ICD-10-CM | POA: Diagnosis not present

## 2017-12-09 DIAGNOSIS — M659 Synovitis and tenosynovitis, unspecified: Secondary | ICD-10-CM | POA: Insufficient documentation

## 2017-12-20 DIAGNOSIS — R1013 Epigastric pain: Secondary | ICD-10-CM | POA: Diagnosis not present

## 2017-12-21 DIAGNOSIS — N281 Cyst of kidney, acquired: Secondary | ICD-10-CM | POA: Diagnosis not present

## 2017-12-21 DIAGNOSIS — K5730 Diverticulosis of large intestine without perforation or abscess without bleeding: Secondary | ICD-10-CM | POA: Diagnosis not present

## 2017-12-21 DIAGNOSIS — R1010 Upper abdominal pain, unspecified: Secondary | ICD-10-CM | POA: Diagnosis not present

## 2017-12-21 DIAGNOSIS — K5790 Diverticulosis of intestine, part unspecified, without perforation or abscess without bleeding: Secondary | ICD-10-CM | POA: Diagnosis not present

## 2017-12-28 DIAGNOSIS — M2391 Unspecified internal derangement of right knee: Secondary | ICD-10-CM | POA: Diagnosis not present

## 2017-12-28 DIAGNOSIS — M25561 Pain in right knee: Secondary | ICD-10-CM | POA: Diagnosis not present

## 2018-01-06 DIAGNOSIS — M2391 Unspecified internal derangement of right knee: Secondary | ICD-10-CM | POA: Diagnosis not present

## 2018-01-06 DIAGNOSIS — M25561 Pain in right knee: Secondary | ICD-10-CM | POA: Diagnosis not present

## 2018-01-08 DIAGNOSIS — M1711 Unilateral primary osteoarthritis, right knee: Secondary | ICD-10-CM | POA: Diagnosis not present

## 2018-01-08 DIAGNOSIS — S83241D Other tear of medial meniscus, current injury, right knee, subsequent encounter: Secondary | ICD-10-CM | POA: Diagnosis not present

## 2018-01-10 DIAGNOSIS — S83241A Other tear of medial meniscus, current injury, right knee, initial encounter: Secondary | ICD-10-CM | POA: Insufficient documentation

## 2018-01-10 DIAGNOSIS — M1711 Unilateral primary osteoarthritis, right knee: Secondary | ICD-10-CM | POA: Insufficient documentation

## 2018-01-15 DIAGNOSIS — Z01810 Encounter for preprocedural cardiovascular examination: Secondary | ICD-10-CM | POA: Diagnosis not present

## 2018-01-15 DIAGNOSIS — I2510 Atherosclerotic heart disease of native coronary artery without angina pectoris: Secondary | ICD-10-CM | POA: Diagnosis not present

## 2018-02-15 DIAGNOSIS — S83241A Other tear of medial meniscus, current injury, right knee, initial encounter: Secondary | ICD-10-CM | POA: Diagnosis not present

## 2018-02-15 DIAGNOSIS — M2241 Chondromalacia patellae, right knee: Secondary | ICD-10-CM | POA: Diagnosis not present

## 2018-02-15 DIAGNOSIS — M25861 Other specified joint disorders, right knee: Secondary | ICD-10-CM | POA: Diagnosis not present

## 2018-02-15 DIAGNOSIS — M23221 Derangement of posterior horn of medial meniscus due to old tear or injury, right knee: Secondary | ICD-10-CM | POA: Diagnosis not present

## 2018-02-15 DIAGNOSIS — M65861 Other synovitis and tenosynovitis, right lower leg: Secondary | ICD-10-CM | POA: Diagnosis not present

## 2018-02-15 DIAGNOSIS — M94261 Chondromalacia, right knee: Secondary | ICD-10-CM | POA: Diagnosis not present

## 2018-03-20 DIAGNOSIS — H6123 Impacted cerumen, bilateral: Secondary | ICD-10-CM | POA: Diagnosis not present

## 2018-03-20 DIAGNOSIS — H903 Sensorineural hearing loss, bilateral: Secondary | ICD-10-CM | POA: Diagnosis not present

## 2018-03-25 ENCOUNTER — Other Ambulatory Visit: Payer: Self-pay | Admitting: Internal Medicine

## 2018-03-27 DIAGNOSIS — Z23 Encounter for immunization: Secondary | ICD-10-CM | POA: Diagnosis not present

## 2018-04-02 DIAGNOSIS — Z4789 Encounter for other orthopedic aftercare: Secondary | ICD-10-CM | POA: Diagnosis not present

## 2018-04-08 DIAGNOSIS — Z4789 Encounter for other orthopedic aftercare: Secondary | ICD-10-CM | POA: Insufficient documentation

## 2018-04-25 DIAGNOSIS — R351 Nocturia: Secondary | ICD-10-CM | POA: Diagnosis not present

## 2018-04-25 DIAGNOSIS — N401 Enlarged prostate with lower urinary tract symptoms: Secondary | ICD-10-CM | POA: Diagnosis not present

## 2018-06-04 DIAGNOSIS — M1711 Unilateral primary osteoarthritis, right knee: Secondary | ICD-10-CM | POA: Diagnosis not present

## 2018-06-04 DIAGNOSIS — R7303 Prediabetes: Secondary | ICD-10-CM | POA: Diagnosis not present

## 2018-06-21 DIAGNOSIS — C441121 Basal cell carcinoma of skin of right upper eyelid, including canthus: Secondary | ICD-10-CM | POA: Diagnosis not present

## 2018-06-21 DIAGNOSIS — Z08 Encounter for follow-up examination after completed treatment for malignant neoplasm: Secondary | ICD-10-CM | POA: Diagnosis not present

## 2018-06-21 DIAGNOSIS — D485 Neoplasm of uncertain behavior of skin: Secondary | ICD-10-CM | POA: Diagnosis not present

## 2018-06-21 DIAGNOSIS — L570 Actinic keratosis: Secondary | ICD-10-CM | POA: Diagnosis not present

## 2018-06-21 DIAGNOSIS — Z85828 Personal history of other malignant neoplasm of skin: Secondary | ICD-10-CM | POA: Diagnosis not present

## 2018-06-22 ENCOUNTER — Encounter: Payer: Self-pay | Admitting: Internal Medicine

## 2018-06-24 NOTE — Telephone Encounter (Signed)
Staff to contact pt for appt for preop evaluation for surgury

## 2018-06-27 NOTE — Telephone Encounter (Signed)
Left patient vm to give office a call to schedule

## 2018-07-17 DIAGNOSIS — M1711 Unilateral primary osteoarthritis, right knee: Secondary | ICD-10-CM | POA: Diagnosis not present

## 2018-07-17 DIAGNOSIS — I2510 Atherosclerotic heart disease of native coronary artery without angina pectoris: Secondary | ICD-10-CM | POA: Diagnosis not present

## 2018-07-17 DIAGNOSIS — Z01810 Encounter for preprocedural cardiovascular examination: Secondary | ICD-10-CM | POA: Diagnosis not present

## 2018-07-18 ENCOUNTER — Encounter: Payer: Self-pay | Admitting: Internal Medicine

## 2018-07-18 ENCOUNTER — Ambulatory Visit (INDEPENDENT_AMBULATORY_CARE_PROVIDER_SITE_OTHER): Payer: Medicare Other | Admitting: Internal Medicine

## 2018-07-18 VITALS — BP 122/80 | HR 74 | Temp 97.9°F | Ht 67.0 in | Wt 163.0 lb

## 2018-07-18 DIAGNOSIS — Z01818 Encounter for other preprocedural examination: Secondary | ICD-10-CM

## 2018-07-18 DIAGNOSIS — I10 Essential (primary) hypertension: Secondary | ICD-10-CM | POA: Diagnosis not present

## 2018-07-18 DIAGNOSIS — F419 Anxiety disorder, unspecified: Secondary | ICD-10-CM | POA: Diagnosis not present

## 2018-07-18 DIAGNOSIS — K219 Gastro-esophageal reflux disease without esophagitis: Secondary | ICD-10-CM

## 2018-07-18 DIAGNOSIS — E785 Hyperlipidemia, unspecified: Secondary | ICD-10-CM

## 2018-07-18 DIAGNOSIS — R9431 Abnormal electrocardiogram [ECG] [EKG]: Secondary | ICD-10-CM | POA: Diagnosis not present

## 2018-07-18 MED ORDER — CIMETIDINE 200 MG PO TABS: 200.0000 mg | ORAL_TABLET | Freq: Two times a day (BID) | ORAL | 11 refills | Status: DC | PRN

## 2018-07-18 NOTE — Assessment & Plan Note (Addendum)
stable overall by history and exam, recent data reviewed with pt, and pt to continue medical treatment as before,  to f/u any worsening symptoms or concerns  Note:  Total time for pt hx, exam, review of record with pt in the room, determination of diagnoses and plan for further eval and tx is > 40 min, with over 50% spent in coordination and counseling of patient including the differential dx, tx, further evaluation and other management of HLD, HTN, anxiety, GERD, and preoperative evaluation

## 2018-07-18 NOTE — Assessment & Plan Note (Signed)
stable overall by history and exam, recent data reviewed with pt, and pt to continue medical treatment as before,  to f/u any worsening symptoms or concerns  

## 2018-07-18 NOTE — Assessment & Plan Note (Signed)
Ok to change the zantac to tagamet

## 2018-07-18 NOTE — Assessment & Plan Note (Signed)
OK for surgury as planned - right TKR

## 2018-07-18 NOTE — Progress Notes (Signed)
Subjective:    Patient ID: [name] [name], male    DOB: 07/12/1945, 73 y.o.   MRN: [id number]  HPI  Here for yearly f/u and preop for right knee TKR; ;  Overall doing ok;  Pt denies Chest pain, worsening SOB, DOE, wheezing, orthopnea, PND, worsening LE edema, palpitations, dizziness or syncope.  Pt denies neurological change such as new headache, facial or extremity weakness.  Pt denies polydipsia, polyuria, or low sugar symptoms. Pt states overall good compliance with treatment and medications, good tolerability, and has been trying to follow appropriate diet.  Pt denies worsening depressive symptoms, suicidal ideation or panic. No fever, night sweats, wt loss, loss of appetite, or other constitutional symptoms.  Pt states good ability with ADL's, has low fall risk, home safety reviewed and adequate, no other significant changes in hearing or vision, and only occasionally active with exercise. Denies worsening depressive symptoms, suicidal ideation, or panic; has ongoing anxiety, stable recently.s/p right knee arthroscopy aug 2019 but unfortunately did not relieve all pain,  Has areas of bone on bone , no longer can do his 2 miles on treadmill, as well as stationary bike, can only do arm exercise machines.  Uneven surfaces and inclines are much more difficult.   Is also on zantac, asks for change due to recall, but wife was unable herself to get rx pepcid at pharmacy.  Has not tried tagamet. Last EGD 2019 negative per pt Past Medical History:  Diagnosis Date  . Allergic rhinitis   . Anxiety 05/01/2011  . Atherosclerosis of aorta ([place])   . BPH (benign prostatic hyperplasia)   . CAD (coronary artery disease)   . Chronic prostatitis   . Diverticulitis   . DJD (degenerative joint disease), lumbosacral   . Hearing loss in right ear   . HLD (hyperlipidemia)   . HTN (hypertension)   . Inguinal hernia without mention of obstruction or gangrene, unilateral or unspecified, (not specified as recurrent)   .  Ocular migraine   . Old myocardial infarction 1989  . PUD (peptic ulcer disease)   . Transient amnesia    Past Surgical History:  Procedure Laterality Date  . APPENDECTOMY    . CORONARY STENT PLACEMENT    . INGUINAL HERNIA REPAIR      reports that he quit smoking about 38 years ago. He has never used smokeless tobacco. He reports current alcohol use. He reports that he does not use drugs. family history includes Alcohol abuse in his brother and father; Cancer in his father and unknown relative; Heart disease in his unknown relative; Hypertension in his unknown relative. Allergies  Allergen Reactions  . Niacin     Hot flashes  . Other Other (See Comments)    Environmental allergies/ post nasal drip  . Penicillins     As child: muscles stiffened   Current Outpatient Medications on File Prior to Visit  Medication Sig Dispense Refill  . aspirin 81 MG tablet Take 81 mg by mouth daily.    Marland Kitchen atorvastatin (LIPITOR) 20 MG tablet TAKE 1 TABLET BY MOUTH DAILY 90 tablet 3  . atorvastatin (LIPITOR) 20 MG tablet TAKE 1 TABLET BY MOUTH DAILY 90 tablet 0  . fluticasone (FLONASE) 50 MCG/ACT nasal spray Place 2 sprays into the nose daily as needed.    . gabapentin (NEURONTIN) 100 MG capsule 1-2 tab by  Mouth at bedtime 180 capsule 1  . montelukast (SINGULAIR) 10 MG tablet TAKE 1 TABLET(10 MG) BY MOUTH DAILY 90 tablet 1  .  ranitidine (ZANTAC) 150 MG tablet TAKE 1 TABLET(150 MG) BY MOUTH TWICE DAILY 60 tablet 11  . tiZANidine (ZANAFLEX) 4 MG tablet TAKE 1 TABLET(4 MG) BY MOUTH AT BEDTIME AS NEEDED FOR MUSCLE SPASMS 90 tablet 3   No current facility-administered medications on file prior to visit.     Review of Systems  Constitutional: Negative for other unusual diaphoresis or sweats HENT: Negative for ear discharge or swelling Eyes: Negative for other worsening visual disturbances Respiratory: Negative for stridor or other swelling  Gastrointestinal: Negative for worsening distension or other  blood Genitourinary: Negative for retention or other urinary change Musculoskeletal: Negative for other MSK pain or swelling Skin: Negative for color change or other new lesions Neurological: Negative for worsening tremors and other numbness  Psychiatric/Behavioral: Negative for worsening agitation or other fatigue All other system neg per pt    Objective:   Physical Exam BP 122/80 (BP Location: Left Arm, Patient Position: Sitting, Cuff Size: Normal)   Pulse 74   Temp 97.9 F (36.6 C) (Oral)   Ht 5\' 7"  (1.702 m)   Wt 163 lb (73.9 kg)   SpO2 98%   BMI 25.53 kg/m  VS noted, not ill appearing Constitutional: Pt appears in NAD HENT: Head: NCAT.  Right Ear: External ear normal.  Left Ear: External ear normal.  Eyes: . Pupils are equal, round, and reactive to light. Conjunctivae and EOM are normal Nose: without d/c or deformity Neck: Neck supple. Gross normal ROM Cardiovascular: Normal rate and regular rhythm.   Pulmonary/Chest: Effort normal and breath sounds without rales or wheezing.  Abd:  Soft, NT, ND, + BS, no organomegaly Neurological: Pt is alert. At baseline orientation, motor grossly intact Skin: Skin is warm. No rashes, other new lesions, no LE edema Psychiatric: Pt behavior is normal without agitation  No other exam findings  Lab Results  Component Value Date   WBC 5.9 10/02/2017   HGB 15.3 10/02/2017   HCT 44.5 10/02/2017   PLT 199.0 10/02/2017   GLUCOSE 104 (H) 10/02/2017   CHOL 142 10/02/2017   TRIG 156.0 (H) 10/02/2017   HDL 43.50 10/02/2017   LDLDIRECT 116.9 04/27/2010   LDLCALC 68 10/02/2017   ALT 29 10/02/2017   AST 28 10/02/2017   NA 140 10/02/2017   K 4.1 10/02/2017   CL 104 10/02/2017   CREATININE 1.02 10/02/2017   BUN 14 10/02/2017   CO2 30 10/02/2017   TSH 2.73 10/02/2017   PSA 1.64 10/02/2017   INR 1.0 ratio 06/03/2010   HGBA1C 5.7 03/19/2009        Assessment & Plan:  a

## 2018-07-18 NOTE — Patient Instructions (Addendum)
Please continue all other medications as before, and refills have been done if requested.  Please take all new medication as prescribed - the tagamet  Please have the pharmacy call with any other refills you may need.  Please continue your efforts at being more active, low cholesterol diet, and weight control.  You are otherwise up to date with prevention measures today.  Please keep your appointments with your specialists as you may have planned  We will sign the form when it arrives for OK for Right Knee TKR  Please return in 6 months, or sooner if needed

## 2018-07-29 ENCOUNTER — Telehealth: Payer: Self-pay

## 2018-07-29 NOTE — Telephone Encounter (Signed)
The documentation is present in that note in the end instructions.  I believe this was done, but will look for the new form coming.

## 2018-07-29 NOTE — Telephone Encounter (Signed)
Spoke to pt in regard to a surgery clearance form received. From our last attempt to speak with the pt in regard in December contact was unsuccessful. I explained that to the patient and he was upset because he stated that he was seen on the 16th for a surgery clearance. I explained to him that I would have to follow up with PCP due to lack of documentation on the visit note. I have placed form on PCP desk. Please advise.

## 2018-07-29 NOTE — Telephone Encounter (Signed)
Form signed.

## 2018-07-29 NOTE — Telephone Encounter (Signed)
Form was placed on your desk.

## 2018-07-30 NOTE — Telephone Encounter (Signed)
Form has been faxed and sent to scan.

## 2018-08-07 DIAGNOSIS — M1711 Unilateral primary osteoarthritis, right knee: Secondary | ICD-10-CM | POA: Diagnosis not present

## 2018-08-07 DIAGNOSIS — Z96651 Presence of right artificial knee joint: Secondary | ICD-10-CM | POA: Diagnosis not present

## 2018-08-07 DIAGNOSIS — Z96652 Presence of left artificial knee joint: Secondary | ICD-10-CM | POA: Diagnosis not present

## 2018-08-07 DIAGNOSIS — Z471 Aftercare following joint replacement surgery: Secondary | ICD-10-CM | POA: Diagnosis not present

## 2018-08-08 DIAGNOSIS — M1711 Unilateral primary osteoarthritis, right knee: Secondary | ICD-10-CM | POA: Diagnosis not present

## 2018-08-09 ENCOUNTER — Telehealth: Payer: Self-pay | Admitting: *Deleted

## 2018-08-09 NOTE — Telephone Encounter (Signed)
Rec'd msg on patient ping portal pt admitted to [facility] on 08/07/18 7:00 AM. @ 4:42 PM Transferred to observation. 08/08/18 5:00 PM pt Discharged from [facility] to Home. Will call pt to follow and make hosp appt.Marland KitchenJohny Chess

## 2018-08-09 NOTE — Telephone Encounter (Signed)
Called pt to follow-up on discharge from yesterday @ [facility]. Pt states he had a knee replacement done, and is doing ok. He will be following up w/his orthopedic provider.[name] KitchenJohny Chess

## 2018-08-10 DIAGNOSIS — R269 Unspecified abnormalities of gait and mobility: Secondary | ICD-10-CM | POA: Diagnosis not present

## 2018-08-10 DIAGNOSIS — Z96651 Presence of right artificial knee joint: Secondary | ICD-10-CM | POA: Diagnosis not present

## 2018-08-10 DIAGNOSIS — Z419 Encounter for procedure for purposes other than remedying health state, unspecified: Secondary | ICD-10-CM | POA: Diagnosis not present

## 2018-08-10 DIAGNOSIS — M1711 Unilateral primary osteoarthritis, right knee: Secondary | ICD-10-CM | POA: Diagnosis not present

## 2018-08-12 DIAGNOSIS — Z96651 Presence of right artificial knee joint: Secondary | ICD-10-CM | POA: Diagnosis not present

## 2018-08-12 DIAGNOSIS — R269 Unspecified abnormalities of gait and mobility: Secondary | ICD-10-CM | POA: Diagnosis not present

## 2018-08-12 DIAGNOSIS — M1711 Unilateral primary osteoarthritis, right knee: Secondary | ICD-10-CM | POA: Diagnosis not present

## 2018-08-12 DIAGNOSIS — Z419 Encounter for procedure for purposes other than remedying health state, unspecified: Secondary | ICD-10-CM | POA: Diagnosis not present

## 2018-08-14 DIAGNOSIS — Z419 Encounter for procedure for purposes other than remedying health state, unspecified: Secondary | ICD-10-CM | POA: Diagnosis not present

## 2018-08-14 DIAGNOSIS — Z96651 Presence of right artificial knee joint: Secondary | ICD-10-CM | POA: Diagnosis not present

## 2018-08-14 DIAGNOSIS — M1711 Unilateral primary osteoarthritis, right knee: Secondary | ICD-10-CM | POA: Diagnosis not present

## 2018-08-14 DIAGNOSIS — R269 Unspecified abnormalities of gait and mobility: Secondary | ICD-10-CM | POA: Diagnosis not present

## 2018-08-16 DIAGNOSIS — R269 Unspecified abnormalities of gait and mobility: Secondary | ICD-10-CM | POA: Diagnosis not present

## 2018-08-16 DIAGNOSIS — M1711 Unilateral primary osteoarthritis, right knee: Secondary | ICD-10-CM | POA: Diagnosis not present

## 2018-08-16 DIAGNOSIS — Z96651 Presence of right artificial knee joint: Secondary | ICD-10-CM | POA: Diagnosis not present

## 2018-08-16 DIAGNOSIS — Z419 Encounter for procedure for purposes other than remedying health state, unspecified: Secondary | ICD-10-CM | POA: Diagnosis not present

## 2018-08-17 ENCOUNTER — Other Ambulatory Visit: Payer: Self-pay | Admitting: Nurse Practitioner

## 2018-08-19 DIAGNOSIS — M1711 Unilateral primary osteoarthritis, right knee: Secondary | ICD-10-CM | POA: Diagnosis not present

## 2018-08-19 DIAGNOSIS — R269 Unspecified abnormalities of gait and mobility: Secondary | ICD-10-CM | POA: Diagnosis not present

## 2018-08-19 DIAGNOSIS — Z96651 Presence of right artificial knee joint: Secondary | ICD-10-CM | POA: Diagnosis not present

## 2018-08-19 DIAGNOSIS — Z419 Encounter for procedure for purposes other than remedying health state, unspecified: Secondary | ICD-10-CM | POA: Diagnosis not present

## 2018-08-21 DIAGNOSIS — M1711 Unilateral primary osteoarthritis, right knee: Secondary | ICD-10-CM | POA: Diagnosis not present

## 2018-08-21 DIAGNOSIS — R269 Unspecified abnormalities of gait and mobility: Secondary | ICD-10-CM | POA: Diagnosis not present

## 2018-08-21 DIAGNOSIS — Z419 Encounter for procedure for purposes other than remedying health state, unspecified: Secondary | ICD-10-CM | POA: Diagnosis not present

## 2018-08-21 DIAGNOSIS — Z96651 Presence of right artificial knee joint: Secondary | ICD-10-CM | POA: Diagnosis not present

## 2018-08-22 DIAGNOSIS — Z96651 Presence of right artificial knee joint: Secondary | ICD-10-CM | POA: Diagnosis not present

## 2018-08-22 DIAGNOSIS — M1711 Unilateral primary osteoarthritis, right knee: Secondary | ICD-10-CM | POA: Diagnosis not present

## 2018-08-22 DIAGNOSIS — R269 Unspecified abnormalities of gait and mobility: Secondary | ICD-10-CM | POA: Diagnosis not present

## 2018-08-22 DIAGNOSIS — Z471 Aftercare following joint replacement surgery: Secondary | ICD-10-CM | POA: Diagnosis not present

## 2018-08-22 DIAGNOSIS — Z419 Encounter for procedure for purposes other than remedying health state, unspecified: Secondary | ICD-10-CM | POA: Diagnosis not present

## 2018-08-26 DIAGNOSIS — Z471 Aftercare following joint replacement surgery: Secondary | ICD-10-CM | POA: Diagnosis not present

## 2018-08-26 DIAGNOSIS — Z96651 Presence of right artificial knee joint: Secondary | ICD-10-CM | POA: Diagnosis not present

## 2018-08-29 DIAGNOSIS — Z96651 Presence of right artificial knee joint: Secondary | ICD-10-CM | POA: Diagnosis not present

## 2018-08-29 DIAGNOSIS — Z471 Aftercare following joint replacement surgery: Secondary | ICD-10-CM | POA: Diagnosis not present

## 2018-09-02 DIAGNOSIS — Z96651 Presence of right artificial knee joint: Secondary | ICD-10-CM | POA: Diagnosis not present

## 2018-09-02 DIAGNOSIS — Z471 Aftercare following joint replacement surgery: Secondary | ICD-10-CM | POA: Diagnosis not present

## 2018-09-05 DIAGNOSIS — Z471 Aftercare following joint replacement surgery: Secondary | ICD-10-CM | POA: Diagnosis not present

## 2018-09-05 DIAGNOSIS — Z96651 Presence of right artificial knee joint: Secondary | ICD-10-CM | POA: Diagnosis not present

## 2018-09-09 DIAGNOSIS — Z96651 Presence of right artificial knee joint: Secondary | ICD-10-CM | POA: Diagnosis not present

## 2018-09-09 DIAGNOSIS — Z471 Aftercare following joint replacement surgery: Secondary | ICD-10-CM | POA: Diagnosis not present

## 2018-09-12 DIAGNOSIS — Z96651 Presence of right artificial knee joint: Secondary | ICD-10-CM | POA: Diagnosis not present

## 2018-09-12 DIAGNOSIS — Z471 Aftercare following joint replacement surgery: Secondary | ICD-10-CM | POA: Diagnosis not present

## 2018-09-16 DIAGNOSIS — Z96651 Presence of right artificial knee joint: Secondary | ICD-10-CM | POA: Diagnosis not present

## 2018-09-16 DIAGNOSIS — Z471 Aftercare following joint replacement surgery: Secondary | ICD-10-CM | POA: Diagnosis not present

## 2018-09-19 DIAGNOSIS — Z471 Aftercare following joint replacement surgery: Secondary | ICD-10-CM | POA: Diagnosis not present

## 2018-09-19 DIAGNOSIS — Z96651 Presence of right artificial knee joint: Secondary | ICD-10-CM | POA: Diagnosis not present

## 2018-09-24 DIAGNOSIS — Z96651 Presence of right artificial knee joint: Secondary | ICD-10-CM | POA: Diagnosis not present

## 2018-09-24 DIAGNOSIS — Z471 Aftercare following joint replacement surgery: Secondary | ICD-10-CM | POA: Diagnosis not present

## 2018-09-26 DIAGNOSIS — Z96651 Presence of right artificial knee joint: Secondary | ICD-10-CM | POA: Diagnosis not present

## 2018-09-26 DIAGNOSIS — Z471 Aftercare following joint replacement surgery: Secondary | ICD-10-CM | POA: Diagnosis not present

## 2018-09-30 DIAGNOSIS — Z471 Aftercare following joint replacement surgery: Secondary | ICD-10-CM | POA: Diagnosis not present

## 2018-09-30 DIAGNOSIS — Z96651 Presence of right artificial knee joint: Secondary | ICD-10-CM | POA: Diagnosis not present

## 2018-10-03 DIAGNOSIS — Z471 Aftercare following joint replacement surgery: Secondary | ICD-10-CM | POA: Diagnosis not present

## 2018-10-03 DIAGNOSIS — Z96651 Presence of right artificial knee joint: Secondary | ICD-10-CM | POA: Diagnosis not present

## 2018-10-09 DIAGNOSIS — Z471 Aftercare following joint replacement surgery: Secondary | ICD-10-CM | POA: Diagnosis not present

## 2018-10-09 DIAGNOSIS — Z96651 Presence of right artificial knee joint: Secondary | ICD-10-CM | POA: Diagnosis not present

## 2018-10-10 ENCOUNTER — Encounter: Payer: Self-pay | Admitting: Internal Medicine

## 2018-10-10 MED ORDER — TEMAZEPAM 15 MG PO CAPS: ORAL_CAPSULE | ORAL | 0 refills | Status: DC

## 2018-10-10 MED ORDER — FAMOTIDINE 20 MG PO TABS: 20.0000 mg | ORAL_TABLET | Freq: Two times a day (BID) | ORAL | 3 refills | Status: DC

## 2018-10-11 DIAGNOSIS — Z471 Aftercare following joint replacement surgery: Secondary | ICD-10-CM | POA: Diagnosis not present

## 2018-10-11 DIAGNOSIS — Z96641 Presence of right artificial hip joint: Secondary | ICD-10-CM | POA: Diagnosis not present

## 2018-10-31 DIAGNOSIS — Z96651 Presence of right artificial knee joint: Secondary | ICD-10-CM | POA: Insufficient documentation

## 2018-11-05 ENCOUNTER — Other Ambulatory Visit: Payer: Self-pay | Admitting: Internal Medicine

## 2018-11-05 MED ORDER — TEMAZEPAM 15 MG PO CAPS: ORAL_CAPSULE | ORAL | 5 refills | Status: DC

## 2018-11-05 NOTE — Telephone Encounter (Signed)
Done erx 

## 2018-11-28 DIAGNOSIS — D225 Melanocytic nevi of trunk: Secondary | ICD-10-CM | POA: Diagnosis not present

## 2018-11-28 DIAGNOSIS — L905 Scar conditions and fibrosis of skin: Secondary | ICD-10-CM | POA: Diagnosis not present

## 2018-11-28 DIAGNOSIS — L578 Other skin changes due to chronic exposure to nonionizing radiation: Secondary | ICD-10-CM | POA: Diagnosis not present

## 2018-11-28 DIAGNOSIS — L111 Transient acantholytic dermatosis [Grover]: Secondary | ICD-10-CM | POA: Diagnosis not present

## 2018-11-28 DIAGNOSIS — Z85828 Personal history of other malignant neoplasm of skin: Secondary | ICD-10-CM | POA: Diagnosis not present

## 2018-11-28 DIAGNOSIS — L820 Inflamed seborrheic keratosis: Secondary | ICD-10-CM | POA: Diagnosis not present

## 2018-11-28 DIAGNOSIS — D485 Neoplasm of uncertain behavior of skin: Secondary | ICD-10-CM | POA: Diagnosis not present

## 2018-11-28 DIAGNOSIS — L570 Actinic keratosis: Secondary | ICD-10-CM | POA: Diagnosis not present

## 2018-11-28 DIAGNOSIS — L821 Other seborrheic keratosis: Secondary | ICD-10-CM | POA: Diagnosis not present

## 2018-12-14 ENCOUNTER — Other Ambulatory Visit: Payer: Self-pay | Admitting: Internal Medicine

## 2018-12-26 DIAGNOSIS — Z96651 Presence of right artificial knee joint: Secondary | ICD-10-CM | POA: Diagnosis not present

## 2019-01-17 ENCOUNTER — Other Ambulatory Visit (INDEPENDENT_AMBULATORY_CARE_PROVIDER_SITE_OTHER): Payer: Medicare Other

## 2019-01-17 ENCOUNTER — Encounter: Payer: Self-pay | Admitting: Internal Medicine

## 2019-01-17 ENCOUNTER — Ambulatory Visit (INDEPENDENT_AMBULATORY_CARE_PROVIDER_SITE_OTHER): Payer: Medicare Other | Admitting: Internal Medicine

## 2019-01-17 ENCOUNTER — Other Ambulatory Visit: Payer: Self-pay

## 2019-01-17 VITALS — BP 140/80 | HR 78 | Temp 98.2°F | Ht 67.0 in | Wt 158.0 lb

## 2019-01-17 DIAGNOSIS — E538 Deficiency of other specified B group vitamins: Secondary | ICD-10-CM | POA: Diagnosis not present

## 2019-01-17 DIAGNOSIS — E611 Iron deficiency: Secondary | ICD-10-CM | POA: Diagnosis not present

## 2019-01-17 DIAGNOSIS — R739 Hyperglycemia, unspecified: Secondary | ICD-10-CM

## 2019-01-17 DIAGNOSIS — I10 Essential (primary) hypertension: Secondary | ICD-10-CM | POA: Diagnosis not present

## 2019-01-17 DIAGNOSIS — E559 Vitamin D deficiency, unspecified: Secondary | ICD-10-CM | POA: Diagnosis not present

## 2019-01-17 DIAGNOSIS — E785 Hyperlipidemia, unspecified: Secondary | ICD-10-CM

## 2019-01-17 DIAGNOSIS — N320 Bladder-neck obstruction: Secondary | ICD-10-CM

## 2019-01-17 DIAGNOSIS — Z23 Encounter for immunization: Secondary | ICD-10-CM | POA: Diagnosis not present

## 2019-01-17 LAB — CBC WITH DIFFERENTIAL/PLATELET
Basophils Absolute: 0 10*3/uL (ref 0.0–0.1)
Basophils Relative: 0.7 % (ref 0.0–3.0)
Eosinophils Absolute: 0.1 10*3/uL (ref 0.0–0.7)
Eosinophils Relative: 1 % (ref 0.0–5.0)
HCT: 45.5 % (ref 39.0–52.0)
Hemoglobin: 15.2 g/dL (ref 13.0–17.0)
Lymphocytes Relative: 19 % (ref 12.0–46.0)
Lymphs Abs: 1.3 10*3/uL (ref 0.7–4.0)
MCHC: 33.4 g/dL (ref 30.0–36.0)
MCV: 92.7 fl (ref 78.0–100.0)
Monocytes Absolute: 0.8 10*3/uL (ref 0.1–1.0)
Monocytes Relative: 12.3 % — ABNORMAL HIGH (ref 3.0–12.0)
Neutro Abs: 4.5 10*3/uL (ref 1.4–7.7)
Neutrophils Relative %: 67 % (ref 43.0–77.0)
Platelets: 215 10*3/uL (ref 150.0–400.0)
RBC: 4.91 Mil/uL (ref 4.22–5.81)
RDW: 14.5 % (ref 11.5–15.5)
WBC: 6.7 10*3/uL (ref 4.0–10.5)

## 2019-01-17 LAB — TSH: TSH: 2.14 u[IU]/mL (ref 0.35–4.50)

## 2019-01-17 LAB — BASIC METABOLIC PANEL
BUN: 13 mg/dL (ref 6–23)
CO2: 32 mEq/L (ref 19–32)
Calcium: 9.4 mg/dL (ref 8.4–10.5)
Chloride: 103 mEq/L (ref 96–112)
Creatinine, Ser: 1.08 mg/dL (ref 0.40–1.50)
GFR: 66.68 mL/min (ref >60.00)
Glucose, Bld: 91 mg/dL (ref 70–99)
Potassium: 5.1 mEq/L (ref 3.5–5.1)
Sodium: 140 mEq/L (ref 135–145)

## 2019-01-17 LAB — HEPATIC FUNCTION PANEL
ALT: 19 U/L (ref 0–53)
AST: 22 U/L (ref 0–37)
Albumin: 4.4 g/dL (ref 3.5–5.2)
Alkaline Phosphatase: 82 U/L (ref 39–117)
Bilirubin, Direct: 0.1 mg/dL (ref 0.0–0.3)
Total Bilirubin: 0.7 mg/dL (ref 0.2–1.2)
Total Protein: 6.9 g/dL (ref 6.0–8.3)

## 2019-01-17 LAB — LIPID PANEL
Cholesterol: 152 mg/dL (ref 0–200)
HDL: 54.7 mg/dL (ref >39.00)
LDL Cholesterol: 74 mg/dL (ref 0–99)
NonHDL: 96.93
Total CHOL/HDL Ratio: 3
Triglycerides: 113 mg/dL (ref 0.0–149.0)
VLDL: 22.6 mg/dL (ref 0.0–40.0)

## 2019-01-17 LAB — URINALYSIS, ROUTINE W REFLEX MICROSCOPIC
Bilirubin Urine: NEGATIVE
Hgb urine dipstick: NEGATIVE
Ketones, ur: NEGATIVE
Leukocytes,Ua: NEGATIVE
Nitrite: NEGATIVE
RBC / HPF: NONE SEEN (ref 0–?)
Specific Gravity, Urine: 1.01 (ref 1.000–1.030)
Total Protein, Urine: NEGATIVE
Urine Glucose: NEGATIVE
Urobilinogen, UA: 0.2 (ref 0.0–1.0)
pH: 5.5 (ref 5.0–8.0)

## 2019-01-17 LAB — VITAMIN D 25 HYDROXY (VIT D DEFICIENCY, FRACTURES): VITD: 36.97 ng/mL (ref 30.00–100.00)

## 2019-01-17 LAB — PSA: PSA: 5.89 ng/mL — ABNORMAL HIGH (ref 0.10–4.00)

## 2019-01-17 LAB — IBC PANEL
Iron: 89 ug/dL (ref 42–165)
Saturation Ratios: 27.1 % (ref 20.0–50.0)
Transferrin: 235 mg/dL (ref 212.0–360.0)

## 2019-01-17 LAB — HEMOGLOBIN A1C: Hgb A1c MFr Bld: 5.7 % (ref 4.6–6.5)

## 2019-01-17 LAB — VITAMIN B12: Vitamin B-12: 383 pg/mL (ref 211–911)

## 2019-01-17 NOTE — Progress Notes (Signed)
Subjective:    Patient ID: [name] [name], male    DOB: 06-09-1946, 73 y.o.   MRN: [id number]  HPI  Here for yearly f/u;  Overall doing ok;  Pt denies Chest pain, worsening SOB, DOE, wheezing, orthopnea, PND, worsening LE edema, palpitations, dizziness or syncope.  Pt denies neurological change such as new headache, facial or extremity weakness.  Pt denies polydipsia, polyuria, or low sugar symptoms. Pt states overall good compliance with treatment and medications, good tolerability, and has been trying to follow appropriate diet.  Pt denies worsening depressive symptoms, suicidal ideation or panic. No fever, night sweats, wt loss, loss of appetite, or other constitutional symptoms.  Pt states good ability with ADL's, has low fall risk, home safety reviewed and adequate, no other significant changes in hearing or vision, and only occasionally active with exercise.  Wt overall stable despite the pandemic and right knee surgury (arthorscopic in August 2019, then TKR ); has some minor balance issue but overall doing ok.   Wt Readings from Last 3 Encounters:  01/17/19 158 lb (71.7 kg)  07/18/18 163 lb (73.9 kg)  10/26/17 157 lb (71.2 kg)  S/p right TKR early feb, then had home PT, still some AM stiffness he hopes to improve.  No new complaints Past Medical History:  Diagnosis Date  . Allergic rhinitis   . Anxiety 05/01/2011  . Atherosclerosis of aorta ([place])   . BPH (benign prostatic hyperplasia)   . CAD (coronary artery disease)   . Chronic prostatitis   . Diverticulitis   . DJD (degenerative joint disease), lumbosacral   . Hearing loss in right ear   . HLD (hyperlipidemia)   . HTN (hypertension)   . Inguinal hernia without mention of obstruction or gangrene, unilateral or unspecified, (not specified as recurrent)   . Ocular migraine   . Old myocardial infarction 1989  . PUD (peptic ulcer disease)   . Transient amnesia    Past Surgical History:  Procedure Laterality Date  . APPENDECTOMY     . CORONARY STENT PLACEMENT    . INGUINAL HERNIA REPAIR      reports that he quit smoking about 38 years ago. He has never used smokeless tobacco. He reports current alcohol use. He reports that he does not use drugs. family history includes Alcohol abuse in his brother and father; Cancer in his father and unknown relative; Heart disease in his unknown relative; Hypertension in his unknown relative. Allergies  Allergen Reactions  . Niacin     Hot flashes  . Other Other (See Comments)    Environmental allergies/ post nasal drip  . Penicillins     As child: muscles stiffened   Current Outpatient Medications on File Prior to Visit  Medication Sig Dispense Refill  . aspirin 81 MG tablet Take 81 mg by mouth daily.    Marland Kitchen atorvastatin (LIPITOR) 20 MG tablet TAKE 1 TABLET BY MOUTH DAILY 90 tablet 3  . famotidine (PEPCID) 20 MG tablet Take 1 tablet (20 mg total) by mouth 2 (two) times daily. 180 tablet 3  . fluticasone (FLONASE) 50 MCG/ACT nasal spray Place 2 sprays into the nose daily as needed.    . gabapentin (NEURONTIN) 100 MG capsule 1-2 tab by  Mouth at bedtime 180 capsule 1  . montelukast (SINGULAIR) 10 MG tablet TAKE 1 TABLET(10 MG) BY MOUTH DAILY 90 tablet 1  . temazepam (RESTORIL) 15 MG capsule 1-2 tab by mouth at bedtime as needed for sleep 60 capsule 5  . tiZANidine (  ZANAFLEX) 4 MG tablet TAKE 1 TABLET(4 MG) BY MOUTH AT BEDTIME AS NEEDED FOR MUSCLE SPASMS 90 tablet 3   No current facility-administered medications on file prior to visit.    Review of Systems  Constitutional: Negative for other unusual diaphoresis or sweats HENT: Negative for ear discharge or swelling Eyes: Negative for other worsening visual disturbances Respiratory: Negative for stridor or other swelling  Gastrointestinal: Negative for worsening distension or other blood Genitourinary: Negative for retention or other urinary change Musculoskeletal: Negative for other MSK pain or swelling Skin: Negative for color  change or other new lesions Neurological: Negative for worsening tremors and other numbness  Psychiatric/Behavioral: Negative for worsening agitation or other fatigue All other system neg per pt    Objective:   Physical Exam BP 140/80 (BP Location: Left Arm, Patient Position: Sitting, Cuff Size: Normal)   Pulse 78   Temp 98.2 F (36.8 C) (Oral)   Ht 5\' 7"  (1.702 m)   Wt 158 lb (71.7 kg)   SpO2 96%   BMI 24.75 kg/m  VS noted,  Constitutional: Pt appears in NAD HENT: Head: NCAT.  Right Ear: External ear normal.  Left Ear: External ear normal.  Eyes: . Pupils are equal, round, and reactive to light. Conjunctivae and EOM are normal Nose: without d/c or deformity Neck: Neck supple. Gross normal ROM Cardiovascular: Normal rate and regular rhythm.   Pulmonary/Chest: Effort normal and breath sounds without rales or wheezing.  Abd:  Soft, NT, ND, + BS, no organomegaly Neurological: Pt is alert. At baseline orientation, motor grossly intact Skin: Skin is warm. No rashes, other new lesions, no LE edema Psychiatric: Pt behavior is normal without agitation  No other exam findings Lab Results  Component Value Date   WBC 6.7 01/17/2019   HGB 15.2 01/17/2019   HCT 45.5 01/17/2019   PLT 215.0 01/17/2019   GLUCOSE 91 01/17/2019   CHOL 152 01/17/2019   TRIG 113.0 01/17/2019   HDL 54.70 01/17/2019   LDLDIRECT 116.9 04/27/2010   LDLCALC 74 01/17/2019   ALT 19 01/17/2019   AST 22 01/17/2019   NA 140 01/17/2019   K 5.1 01/17/2019   CL 103 01/17/2019   CREATININE 1.08 01/17/2019   BUN 13 01/17/2019   CO2 32 01/17/2019   TSH 2.14 01/17/2019   PSA 5.89 (H) 01/17/2019   INR 1.0 ratio 06/03/2010   HGBA1C 5.7 01/17/2019       Assessment & Plan:

## 2019-01-17 NOTE — Patient Instructions (Addendum)

## 2019-01-18 ENCOUNTER — Other Ambulatory Visit: Payer: Self-pay | Admitting: Internal Medicine

## 2019-01-18 ENCOUNTER — Encounter: Payer: Self-pay | Admitting: Internal Medicine

## 2019-01-18 DIAGNOSIS — R972 Elevated prostate specific antigen [PSA]: Secondary | ICD-10-CM

## 2019-01-18 MED ORDER — DOXYCYCLINE HYCLATE 100 MG PO TABS: 100.0000 mg | ORAL_TABLET | Freq: Two times a day (BID) | ORAL | 0 refills | Status: DC

## 2019-01-18 NOTE — Assessment & Plan Note (Signed)
stable overall by history and exam, recent data reviewed with pt, and pt to continue medical treatment as before,  to f/u any worsening symptoms or concerns  

## 2019-01-18 NOTE — Assessment & Plan Note (Signed)
stable overall by history and exam, recent data reviewed with pt, and pt to continue medical treatment as before,  to f/u any worsening symptoms or concerns, for low chol diet 

## 2019-01-18 NOTE — Assessment & Plan Note (Signed)
stable overall by history and exam, recent data reviewed with pt, and pt to continue medical treatment as before,  to f/u any worsening symptoms or concerns, for a1c with labs 

## 2019-01-18 NOTE — Assessment & Plan Note (Signed)
Also for psa as is due, stable overall by history and exam, recent data reviewed with pt, and pt to continue medical treatment as before,  to f/u any worsening symptoms or concerns

## 2019-01-20 ENCOUNTER — Telehealth: Payer: Self-pay

## 2019-01-20 NOTE — Telephone Encounter (Signed)
Pt has viewed results via MyChart  

## 2019-01-20 NOTE — Telephone Encounter (Signed)
-----   Message from Biagio Borg, MD sent at 01/18/2019  1:45 PM EDT ----- Left message on MyChart, pt to cont same tx except  The test results show that your current treatment is OK, as the tests are stable except the PSA is VERY much higher than the previous.  This often means an infection of the prostate, rather than prostate cancer, so please take an antibiotic for 1 mo (I will see) and then plan to return to the LAB only after this to repeat the PSA.  If it is improved, there will be no concern, but if the PSA remains elevated, you would want to be referred to Urology.  You should also hear from the office about this.Redmond Baseman to please inform pt, I will do rx and order for f/u PSA

## 2019-02-03 ENCOUNTER — Other Ambulatory Visit: Payer: Self-pay

## 2019-02-03 MED ORDER — ATORVASTATIN CALCIUM 20 MG PO TABS: ORAL_TABLET | ORAL | 1 refills | Status: DC

## 2019-02-05 DIAGNOSIS — R972 Elevated prostate specific antigen [PSA]: Secondary | ICD-10-CM | POA: Diagnosis not present

## 2019-02-05 DIAGNOSIS — N400 Enlarged prostate without lower urinary tract symptoms: Secondary | ICD-10-CM | POA: Diagnosis not present

## 2019-03-18 DIAGNOSIS — I739 Peripheral vascular disease, unspecified: Secondary | ICD-10-CM | POA: Diagnosis not present

## 2019-03-18 DIAGNOSIS — I2510 Atherosclerotic heart disease of native coronary artery without angina pectoris: Secondary | ICD-10-CM | POA: Diagnosis not present

## 2019-03-18 DIAGNOSIS — E782 Mixed hyperlipidemia: Secondary | ICD-10-CM | POA: Diagnosis not present

## 2019-03-18 DIAGNOSIS — I252 Old myocardial infarction: Secondary | ICD-10-CM | POA: Diagnosis not present

## 2019-03-21 DIAGNOSIS — Z23 Encounter for immunization: Secondary | ICD-10-CM | POA: Diagnosis not present

## 2019-03-25 DIAGNOSIS — Z96651 Presence of right artificial knee joint: Secondary | ICD-10-CM | POA: Diagnosis not present

## 2019-03-25 DIAGNOSIS — M1711 Unilateral primary osteoarthritis, right knee: Secondary | ICD-10-CM | POA: Diagnosis not present

## 2019-04-07 DIAGNOSIS — Z96651 Presence of right artificial knee joint: Secondary | ICD-10-CM | POA: Diagnosis not present

## 2019-04-07 DIAGNOSIS — Z471 Aftercare following joint replacement surgery: Secondary | ICD-10-CM | POA: Diagnosis not present

## 2019-04-09 DIAGNOSIS — Z471 Aftercare following joint replacement surgery: Secondary | ICD-10-CM | POA: Diagnosis not present

## 2019-04-09 DIAGNOSIS — Z96651 Presence of right artificial knee joint: Secondary | ICD-10-CM | POA: Diagnosis not present

## 2019-04-16 DIAGNOSIS — Z471 Aftercare following joint replacement surgery: Secondary | ICD-10-CM | POA: Diagnosis not present

## 2019-04-16 DIAGNOSIS — Z96651 Presence of right artificial knee joint: Secondary | ICD-10-CM | POA: Diagnosis not present

## 2019-04-18 DIAGNOSIS — Z471 Aftercare following joint replacement surgery: Secondary | ICD-10-CM | POA: Diagnosis not present

## 2019-04-18 DIAGNOSIS — Z96651 Presence of right artificial knee joint: Secondary | ICD-10-CM | POA: Diagnosis not present

## 2019-04-23 DIAGNOSIS — Z96651 Presence of right artificial knee joint: Secondary | ICD-10-CM | POA: Diagnosis not present

## 2019-04-23 DIAGNOSIS — Z471 Aftercare following joint replacement surgery: Secondary | ICD-10-CM | POA: Diagnosis not present

## 2019-04-25 DIAGNOSIS — Z96651 Presence of right artificial knee joint: Secondary | ICD-10-CM | POA: Diagnosis not present

## 2019-04-25 DIAGNOSIS — Z471 Aftercare following joint replacement surgery: Secondary | ICD-10-CM | POA: Diagnosis not present

## 2019-04-29 DIAGNOSIS — Z96651 Presence of right artificial knee joint: Secondary | ICD-10-CM | POA: Diagnosis not present

## 2019-04-29 DIAGNOSIS — Z471 Aftercare following joint replacement surgery: Secondary | ICD-10-CM | POA: Diagnosis not present

## 2019-05-02 DIAGNOSIS — Z471 Aftercare following joint replacement surgery: Secondary | ICD-10-CM | POA: Diagnosis not present

## 2019-05-02 DIAGNOSIS — Z96651 Presence of right artificial knee joint: Secondary | ICD-10-CM | POA: Diagnosis not present

## 2019-05-08 DIAGNOSIS — H6123 Impacted cerumen, bilateral: Secondary | ICD-10-CM | POA: Diagnosis not present

## 2019-05-08 DIAGNOSIS — H903 Sensorineural hearing loss, bilateral: Secondary | ICD-10-CM | POA: Diagnosis not present

## 2019-05-25 ENCOUNTER — Encounter: Payer: Self-pay | Admitting: Internal Medicine

## 2019-05-26 MED ORDER — TEMAZEPAM 15 MG PO CAPS: ORAL_CAPSULE | ORAL | 5 refills | Status: DC

## 2019-05-27 ENCOUNTER — Encounter: Payer: Self-pay | Admitting: Internal Medicine

## 2019-05-27 MED ORDER — TEMAZEPAM 15 MG PO CAPS: ORAL_CAPSULE | ORAL | 5 refills | Status: DC

## 2019-05-27 NOTE — Telephone Encounter (Signed)
Done erx 

## 2019-07-27 ENCOUNTER — Other Ambulatory Visit: Payer: Self-pay | Admitting: Internal Medicine

## 2019-07-27 NOTE — Telephone Encounter (Signed)
Please refill as per office routine med refill policy (all routine meds refilled for 3 mo or monthly per pt preference up to one year from last visit, then month to month grace period for 3 mo, then further med refills will have to be denied)  

## 2019-12-22 ENCOUNTER — Telehealth: Payer: Self-pay | Admitting: Internal Medicine

## 2019-12-22 MED ORDER — TEMAZEPAM 15 MG PO CAPS: ORAL_CAPSULE | ORAL | 0 refills | Status: DC

## 2019-12-22 NOTE — Telephone Encounter (Signed)
Ok for 1 mo restoril - done erx  Please let pt know - due for ROV July 2021 for further refills

## 2019-12-23 NOTE — Telephone Encounter (Signed)
Pt has an apptmnt to come in to the office 01/14/2020.

## 2019-12-26 ENCOUNTER — Ambulatory Visit: Payer: Medicare Other | Admitting: Family Medicine

## 2020-01-15 ENCOUNTER — Other Ambulatory Visit: Payer: Self-pay

## 2020-01-15 ENCOUNTER — Ambulatory Visit (INDEPENDENT_AMBULATORY_CARE_PROVIDER_SITE_OTHER): Payer: Medicare Other | Admitting: Internal Medicine

## 2020-01-15 ENCOUNTER — Encounter: Payer: Self-pay | Admitting: Internal Medicine

## 2020-01-15 VITALS — BP 130/68 | HR 77 | Temp 97.7°F | Ht 67.0 in | Wt 140.0 lb

## 2020-01-15 DIAGNOSIS — E785 Hyperlipidemia, unspecified: Secondary | ICD-10-CM | POA: Diagnosis not present

## 2020-01-15 DIAGNOSIS — E538 Deficiency of other specified B group vitamins: Secondary | ICD-10-CM

## 2020-01-15 DIAGNOSIS — N320 Bladder-neck obstruction: Secondary | ICD-10-CM | POA: Diagnosis not present

## 2020-01-15 DIAGNOSIS — I10 Essential (primary) hypertension: Secondary | ICD-10-CM | POA: Diagnosis not present

## 2020-01-15 DIAGNOSIS — E559 Vitamin D deficiency, unspecified: Secondary | ICD-10-CM

## 2020-01-15 DIAGNOSIS — R739 Hyperglycemia, unspecified: Secondary | ICD-10-CM | POA: Diagnosis not present

## 2020-01-15 DIAGNOSIS — M7662 Achilles tendinitis, left leg: Secondary | ICD-10-CM | POA: Insufficient documentation

## 2020-01-15 NOTE — Patient Instructions (Signed)

## 2020-01-15 NOTE — Progress Notes (Signed)
Subjective:    Patient ID: [name] [name], male    DOB: Mar 17, 1946, 74 y.o.   MRN: [id number]  HPI  Here to f/u; overall doing ok,  Pt denies chest pain, increasing sob or doe, wheezing, orthopnea, PND, increased LE swelling, palpitations, dizziness or syncope.  Pt denies new neurological symptoms such as new headache, or facial or extremity weakness or numbness.  Pt denies polydipsia, polyuria, or low sugar episode.  Pt states overall good compliance with meds, mostly trying to follow appropriate diet, with wt overall stable,  but little exercise however. Had PSA last yr due to prior intercourse, so f/u with urology had PSA back to baseline.  No new complaints Past Medical History:  Diagnosis Date  . Allergic rhinitis   . Anxiety 05/01/2011  . Atherosclerosis of aorta ([place])   . BPH (benign prostatic hyperplasia)   . CAD (coronary artery disease)   . Chronic prostatitis   . Diverticulitis   . DJD (degenerative joint disease), lumbosacral   . Hearing loss in right ear   . HLD (hyperlipidemia)   . HTN (hypertension)   . Inguinal hernia without mention of obstruction or gangrene, unilateral or unspecified, (not specified as recurrent)   . Ocular migraine   . Old myocardial infarction 1989  . PUD (peptic ulcer disease)   . Transient amnesia    Past Surgical History:  Procedure Laterality Date  . APPENDECTOMY    . CORONARY STENT PLACEMENT    . INGUINAL HERNIA REPAIR      reports that he quit smoking about 39 years ago. He has never used smokeless tobacco. He reports current alcohol use. He reports that he does not use drugs. family history includes Alcohol abuse in his brother and father; Cancer in his father and unknown relative; Heart disease in his unknown relative; Hypertension in his unknown relative. Allergies  Allergen Reactions  . Niacin     Hot flashes  . Other Other (See Comments)    Environmental allergies/ post nasal drip  . Penicillins     As child: muscles stiffened    Current Outpatient Medications on File Prior to Visit  Medication Sig Dispense Refill  . aspirin 81 MG tablet Take 81 mg by mouth daily.    Marland Kitchen atorvastatin (LIPITOR) 20 MG tablet TAKE 1 TABLET BY MOUTH DAILY 90 tablet 1  . diclofenac (VOLTAREN) 75 MG EC tablet Take by mouth.    . fluticasone (FLONASE) 50 MCG/ACT nasal spray Place 2 sprays into the nose daily as needed.    . montelukast (SINGULAIR) 10 MG tablet TAKE 1 TABLET(10 MG) BY MOUTH DAILY 90 tablet 1  . tamsulosin (FLOMAX) 0.4 MG CAPS capsule SMARTSIG:1 Capsule(s) By Mouth Every Evening    . temazepam (RESTORIL) 15 MG capsule 1-2 tab by mouth at bedtime as needed for sleep 60 capsule 0   No current facility-administered medications on file prior to visit.   Review of Systems All otherwise neg per pt    Objective:   Physical Exam BP 130/68 (BP Location: Left Arm, Patient Position: Sitting, Cuff Size: Large)   Pulse 77   Temp 97.7 F (36.5 C) (Oral)   Ht 5\' 7"  (1.702 m)   Wt 140 lb (63.5 kg)   SpO2 98%   BMI 21.93 kg/m  VS noted,  Constitutional: Pt appears in NAD HENT: Head: NCAT.  Right Ear: External ear normal.  Left Ear: External ear normal.  Eyes: . Pupils are equal, round, and reactive to light. Conjunctivae and  EOM are normal Nose: without d/c or deformity Neck: Neck supple. Gross normal ROM Cardiovascular: Normal rate and regular rhythm.   Pulmonary/Chest: Effort normal and breath sounds without rales or wheezing.  Abd:  Soft, NT, ND, + BS, no organomegaly Neurological: Pt is alert. At baseline orientation, motor grossly intact Skin: Skin is warm. No rashes, other new lesions, no LE edema Psychiatric: Pt behavior is normal without agitation  All otherwise neg per pt Lab Results  Component Value Date   WBC 5.9 01/15/2020   HGB 15.3 01/15/2020   HCT 45.4 01/15/2020   PLT 211 01/15/2020   GLUCOSE 98 01/15/2020   CHOL 152 01/15/2020   TRIG 118 01/15/2020   HDL 56 01/15/2020   LDLDIRECT 116.9 04/27/2010    LDLCALC 76 01/15/2020   ALT 25 01/15/2020   AST 23 01/15/2020   NA 141 01/15/2020   K 4.4 01/15/2020   CL 105 01/15/2020   CREATININE 1.06 01/15/2020   BUN 15 01/15/2020   CO2 25 01/15/2020   TSH 2.55 01/15/2020   PSA 1.1 01/15/2020   INR 1.0 ratio 06/03/2010   HGBA1C 5.6 01/15/2020       Assessment & Plan:

## 2020-01-16 ENCOUNTER — Encounter: Payer: Self-pay | Admitting: Internal Medicine

## 2020-01-16 LAB — URINALYSIS, ROUTINE W REFLEX MICROSCOPIC
Bilirubin Urine: NEGATIVE
Glucose, UA: NEGATIVE
Hgb urine dipstick: NEGATIVE
Ketones, ur: NEGATIVE
Leukocytes,Ua: NEGATIVE
Nitrite: NEGATIVE
Protein, ur: NEGATIVE
Specific Gravity, Urine: 1.014 (ref 1.001–1.03)
pH: <=5 (ref 5.0–8.0)

## 2020-01-16 LAB — CBC WITH DIFFERENTIAL/PLATELET
Absolute Monocytes: 696 cells/uL (ref 200–950)
Basophils Absolute: 30 cells/uL (ref 0–200)
Basophils Relative: 0.5 %
Eosinophils Absolute: 53 cells/uL (ref 15–500)
Eosinophils Relative: 0.9 %
HCT: 45.4 % (ref 38.5–50.0)
Hemoglobin: 15.3 g/dL (ref 13.2–17.1)
Lymphs Abs: 1428 cells/uL (ref 850–3900)
MCH: 32.2 pg (ref 27.0–33.0)
MCHC: 33.7 g/dL (ref 32.0–36.0)
MCV: 95.6 fL (ref 80.0–100.0)
MPV: 10.6 fL (ref 7.5–12.5)
Monocytes Relative: 11.8 %
Neutro Abs: 3693 cells/uL (ref 1500–7800)
Neutrophils Relative %: 62.6 %
Platelets: 211 10*3/uL (ref 140–400)
RBC: 4.75 10*6/uL (ref 4.20–5.80)
RDW: 12 % (ref 11.0–15.0)
Total Lymphocyte: 24.2 %
WBC: 5.9 10*3/uL (ref 3.8–10.8)

## 2020-01-16 LAB — LIPID PANEL
Cholesterol: 152 mg/dL (ref <200)
HDL: 56 mg/dL (ref >=40)
LDL Cholesterol (Calc): 76 mg/dL (calc)
Non-HDL Cholesterol (Calc): 96 mg/dL (calc) (ref <130)
Total CHOL/HDL Ratio: 2.7 (calc) (ref <5.0)
Triglycerides: 118 mg/dL (ref <150)

## 2020-01-16 LAB — HEPATIC FUNCTION PANEL
AG Ratio: 1.6 (calc) (ref 1.0–2.5)
ALT: 25 U/L (ref 9–46)
AST: 23 U/L (ref 10–35)
Albumin: 4.1 g/dL (ref 3.6–5.1)
Alkaline phosphatase (APISO): 74 U/L (ref 35–144)
Bilirubin, Direct: 0.1 mg/dL (ref 0.0–0.2)
Globulin: 2.5 g/dL (calc) (ref 1.9–3.7)
Indirect Bilirubin: 0.6 mg/dL (calc) (ref 0.2–1.2)
Total Bilirubin: 0.7 mg/dL (ref 0.2–1.2)
Total Protein: 6.6 g/dL (ref 6.1–8.1)

## 2020-01-16 LAB — BASIC METABOLIC PANEL
BUN: 15 mg/dL (ref 7–25)
CO2: 25 mmol/L (ref 20–32)
Calcium: 9.1 mg/dL (ref 8.6–10.3)
Chloride: 105 mmol/L (ref 98–110)
Creat: 1.06 mg/dL (ref 0.70–1.18)
Glucose, Bld: 98 mg/dL (ref 65–99)
Potassium: 4.4 mmol/L (ref 3.5–5.3)
Sodium: 141 mmol/L (ref 135–146)

## 2020-01-16 LAB — HEMOGLOBIN A1C
Hgb A1c MFr Bld: 5.6 % of total Hgb (ref <5.7)
Mean Plasma Glucose: 114 (calc)
eAG (mmol/L): 6.3 (calc)

## 2020-01-16 LAB — VITAMIN B12: Vitamin B-12: 490 pg/mL (ref 200–1100)

## 2020-01-16 LAB — PSA: PSA: 1.1 ng/mL (ref <=4.0)

## 2020-01-16 LAB — VITAMIN D 25 HYDROXY (VIT D DEFICIENCY, FRACTURES): Vit D, 25-Hydroxy: 36 ng/mL (ref 30–100)

## 2020-01-16 LAB — TSH: TSH: 2.55 mIU/L (ref 0.40–4.50)

## 2020-01-17 ENCOUNTER — Encounter: Payer: Self-pay | Admitting: Internal Medicine

## 2020-01-17 NOTE — Assessment & Plan Note (Signed)
stable overall by history and exam, recent data reviewed with pt, and pt to continue medical treatment as before,  to f/u any worsening symptoms or concerns  

## 2020-01-17 NOTE — Assessment & Plan Note (Signed)
Asympt, for f/u psa 

## 2020-03-05 ENCOUNTER — Encounter: Payer: Self-pay | Admitting: Internal Medicine

## 2020-03-05 ENCOUNTER — Other Ambulatory Visit: Payer: Self-pay

## 2020-03-05 MED ORDER — MONTELUKAST SODIUM 10 MG PO TABS: ORAL_TABLET | ORAL | 1 refills | Status: DC

## 2020-03-29 DIAGNOSIS — Z9109 Other allergy status, other than to drugs and biological substances: Secondary | ICD-10-CM | POA: Insufficient documentation

## 2020-04-19 ENCOUNTER — Other Ambulatory Visit: Payer: Self-pay | Admitting: Internal Medicine

## 2020-04-19 NOTE — Telephone Encounter (Signed)
Please refill as per office routine med refill policy (all routine meds refilled for 3 mo or monthly per pt preference up to one year from last visit, then month to month grace period for 3 mo, then further med refills will have to be denied)  

## 2020-05-31 ENCOUNTER — Other Ambulatory Visit: Payer: Self-pay | Admitting: Internal Medicine

## 2020-05-31 NOTE — Telephone Encounter (Signed)
Please refill as per office routine med refill policy (all routine meds refilled for 3 mo or monthly per pt preference up to one year from last visit, then month to month grace period for 3 mo, then further med refills will have to be denied)  

## 2020-07-13 DIAGNOSIS — H2513 Age-related nuclear cataract, bilateral: Secondary | ICD-10-CM | POA: Insufficient documentation

## 2020-07-13 DIAGNOSIS — H43393 Other vitreous opacities, bilateral: Secondary | ICD-10-CM | POA: Insufficient documentation

## 2020-08-31 DIAGNOSIS — M205X2 Other deformities of toe(s) (acquired), left foot: Secondary | ICD-10-CM | POA: Insufficient documentation

## 2020-10-06 ENCOUNTER — Ambulatory Visit: Payer: Medicare Other

## 2020-10-18 ENCOUNTER — Other Ambulatory Visit: Payer: Self-pay | Admitting: Internal Medicine

## 2020-10-18 NOTE — Telephone Encounter (Signed)
Please refill as per office routine med refill policy (all routine meds refilled for 3 mo or monthly per pt preference up to one year from last visit, then month to month grace period for 3 mo, then further med refills will have to be denied)  

## 2021-01-19 ENCOUNTER — Other Ambulatory Visit: Payer: Self-pay

## 2021-01-20 ENCOUNTER — Encounter: Payer: Self-pay | Admitting: Internal Medicine

## 2021-01-20 ENCOUNTER — Ambulatory Visit (INDEPENDENT_AMBULATORY_CARE_PROVIDER_SITE_OTHER): Payer: Medicare Other | Admitting: Internal Medicine

## 2021-01-20 VITALS — BP 118/64 | HR 78 | Temp 97.8°F | Resp 18 | Ht 67.0 in | Wt 154.4 lb

## 2021-01-20 DIAGNOSIS — N320 Bladder-neck obstruction: Secondary | ICD-10-CM | POA: Diagnosis not present

## 2021-01-20 DIAGNOSIS — R739 Hyperglycemia, unspecified: Secondary | ICD-10-CM

## 2021-01-20 DIAGNOSIS — E785 Hyperlipidemia, unspecified: Secondary | ICD-10-CM | POA: Diagnosis not present

## 2021-01-20 DIAGNOSIS — E559 Vitamin D deficiency, unspecified: Secondary | ICD-10-CM

## 2021-01-20 DIAGNOSIS — I700 Atherosclerosis of aorta: Secondary | ICD-10-CM

## 2021-01-20 DIAGNOSIS — I10 Essential (primary) hypertension: Secondary | ICD-10-CM | POA: Diagnosis not present

## 2021-01-20 DIAGNOSIS — F5101 Primary insomnia: Secondary | ICD-10-CM

## 2021-01-20 LAB — URINALYSIS, ROUTINE W REFLEX MICROSCOPIC
Bilirubin Urine: NEGATIVE
Hgb urine dipstick: NEGATIVE
Ketones, ur: NEGATIVE
Leukocytes,Ua: NEGATIVE
Nitrite: NEGATIVE
RBC / HPF: NONE SEEN (ref 0–?)
Specific Gravity, Urine: 1.01 (ref 1.000–1.030)
Total Protein, Urine: NEGATIVE
Urine Glucose: NEGATIVE
Urobilinogen, UA: 0.2 (ref 0.0–1.0)
pH: 6 (ref 5.0–8.0)

## 2021-01-20 LAB — CBC WITH DIFFERENTIAL/PLATELET
Basophils Absolute: 0 10*3/uL (ref 0.0–0.1)
Basophils Relative: 0.5 % (ref 0.0–3.0)
Eosinophils Absolute: 0 10*3/uL (ref 0.0–0.7)
Eosinophils Relative: 0.7 % (ref 0.0–5.0)
HCT: 44.5 % (ref 39.0–52.0)
Hemoglobin: 15 g/dL (ref 13.0–17.0)
Lymphocytes Relative: 19 % (ref 12.0–46.0)
Lymphs Abs: 1.2 10*3/uL (ref 0.7–4.0)
MCHC: 33.7 g/dL (ref 30.0–36.0)
MCV: 94.2 fl (ref 78.0–100.0)
Monocytes Absolute: 0.9 10*3/uL (ref 0.1–1.0)
Monocytes Relative: 13 % — ABNORMAL HIGH (ref 3.0–12.0)
Neutro Abs: 4.4 10*3/uL (ref 1.4–7.7)
Neutrophils Relative %: 66.8 % (ref 43.0–77.0)
Platelets: 209 10*3/uL (ref 150.0–400.0)
RBC: 4.72 Mil/uL (ref 4.22–5.81)
RDW: 13.4 % (ref 11.5–15.5)
WBC: 6.6 10*3/uL (ref 4.0–10.5)

## 2021-01-20 LAB — BASIC METABOLIC PANEL
BUN: 15 mg/dL (ref 6–23)
CO2: 28 mEq/L (ref 19–32)
Calcium: 9.5 mg/dL (ref 8.4–10.5)
Chloride: 103 mEq/L (ref 96–112)
Creatinine, Ser: 1.15 mg/dL (ref 0.40–1.50)
GFR: 61.67 mL/min (ref >60.00)
Glucose, Bld: 95 mg/dL (ref 70–99)
Potassium: 4.8 mEq/L (ref 3.5–5.1)
Sodium: 139 mEq/L (ref 135–145)

## 2021-01-20 LAB — LIPID PANEL
Cholesterol: 139 mg/dL (ref 0–200)
HDL: 47.4 mg/dL (ref >39.00)
LDL Cholesterol: 69 mg/dL (ref 0–99)
NonHDL: 91.15
Total CHOL/HDL Ratio: 3
Triglycerides: 110 mg/dL (ref 0.0–149.0)
VLDL: 22 mg/dL (ref 0.0–40.0)

## 2021-01-20 LAB — HEPATIC FUNCTION PANEL
ALT: 18 U/L (ref 0–53)
AST: 20 U/L (ref 0–37)
Albumin: 4.2 g/dL (ref 3.5–5.2)
Alkaline Phosphatase: 82 U/L (ref 39–117)
Bilirubin, Direct: 0.1 mg/dL (ref 0.0–0.3)
Total Bilirubin: 0.7 mg/dL (ref 0.2–1.2)
Total Protein: 6.8 g/dL (ref 6.0–8.3)

## 2021-01-20 LAB — HEMOGLOBIN A1C: Hgb A1c MFr Bld: 5.7 % (ref 4.6–6.5)

## 2021-01-20 LAB — VITAMIN D 25 HYDROXY (VIT D DEFICIENCY, FRACTURES): VITD: 39.15 ng/mL (ref 30.00–100.00)

## 2021-01-20 LAB — PSA: PSA: 1.8 ng/mL (ref 0.10–4.00)

## 2021-01-20 LAB — TSH: TSH: 1.86 u[IU]/mL (ref 0.35–5.50)

## 2021-01-20 MED ORDER — TEMAZEPAM 15 MG PO CAPS: ORAL_CAPSULE | ORAL | 2 refills | Status: DC

## 2021-01-20 MED ORDER — MONTELUKAST SODIUM 10 MG PO TABS: ORAL_TABLET | ORAL | 3 refills | Status: DC

## 2021-01-20 MED ORDER — ATORVASTATIN CALCIUM 20 MG PO TABS: 20.0000 mg | ORAL_TABLET | Freq: Every day | ORAL | 3 refills | Status: DC

## 2021-01-20 NOTE — Progress Notes (Signed)
Patient ID: [name] [name], male   DOB: Jan 29, 1946, 75 y.o.   MRN: [id number]         Chief Complaint:: yearly exam       HPI:  [name] [name] is a 75 y.o. male here overall doing ok,  Pt denies chest pain, increased sob or doe, wheezing, orthopnea, PND, increased LE swelling, palpitations, dizziness or syncope.   Pt denies polydipsia, polyuria, or new focal neuro s/s.   Pt denies fever, wt loss, night sweats, loss of appetite, or other constitutional symptoms   Walks 2 miles almost every day. Trying to follow a lower cholesterol diet.  No other new complaints except has worsening difficulty with getting to sleep most nights, has more stressors recenlty, unable to turn off the brain.     Wt Readings from Last 3 Encounters:  01/20/21 154 lb 6.4 oz (70 kg)  01/15/20 140 lb (63.5 kg)  01/17/19 158 lb (71.7 kg)   BP Readings from Last 3 Encounters:  01/20/21 118/64  01/15/20 130/68  01/17/19 140/80   Immunization History  Administered Date(s) Administered   Fluad Quad(high Dose 65+) 03/21/2019   Influenza Whole 03/24/2010, 03/11/2012, 03/13/2013   Influenza, High Dose Seasonal PF 03/27/2018   Influenza-Unspecified 03/03/2014, 04/19/2014, 03/11/2015, 03/14/2017, 05/01/2019, 03/26/2020, 03/26/2020   Moderna Sars-Covid-2 Vaccination 07/28/2019, 08/25/2019, 04/23/2020, 10/25/2020   Pneumococcal Conjugate-13 07/11/2013, 04/19/2014   Pneumococcal Polysaccharide-23 03/19/2009   Td 03/19/2009   Tdap 01/17/2019   Zoster, Live 02/20/2006  There are no preventive care reminders to display for this patient.    Past Medical History:  Diagnosis Date   Allergic rhinitis    Anxiety 05/01/2011   Atherosclerosis of aorta (HCC)    BPH (benign prostatic hyperplasia)    CAD (coronary artery disease)    Chronic prostatitis    Diverticulitis    DJD (degenerative joint disease), lumbosacral    Hearing loss in right ear    HLD (hyperlipidemia)    HTN (hypertension)    Inguinal hernia without mention of  obstruction or gangrene, unilateral or unspecified, (not specified as recurrent)    Ocular migraine    Old myocardial infarction 1989   PUD (peptic ulcer disease)    Transient amnesia    Past Surgical History:  Procedure Laterality Date   APPENDECTOMY     CORONARY STENT PLACEMENT     INGUINAL HERNIA REPAIR      reports that he quit smoking about 40 years ago. He has never used smokeless tobacco. He reports current alcohol use. He reports that he does not use drugs. family history includes Alcohol abuse in his brother and father; Cancer in his father and unknown relative; Heart disease in his unknown relative; Hypertension in his unknown relative. Allergies  Allergen Reactions   Niacin     Hot flashes   Other Other (See Comments)    Environmental allergies/ post nasal drip   Penicillins     As child: muscles stiffened   Current Outpatient Medications on File Prior to Visit  Medication Sig Dispense Refill   aspirin 81 MG tablet Take 81 mg by mouth daily.     fluticasone (FLONASE) 50 MCG/ACT nasal spray Place 2 sprays into the nose daily as needed.     No current facility-administered medications on file prior to visit.        ROS:  All others reviewed and negative.  Objective        PE:  BP 118/64   Pulse 78   Temp 97.8 F (  36.6 C) (Oral)   Resp 18   Ht '5\' 7"'$  (1.702 m)   Wt 154 lb 6.4 oz (70 kg)   SpO2 99%   BMI 24.18 kg/m                 Constitutional: Pt appears in NAD               HENT: Head: NCAT.                Right Ear: External ear normal.                 Left Ear: External ear normal.                Eyes: . Pupils are equal, round, and reactive to light. Conjunctivae and EOM are normal               Nose: without d/c or deformity               Neck: Neck supple. Gross normal ROM               Cardiovascular: Normal rate and regular rhythm.                 Pulmonary/Chest: Effort normal and breath sounds without rales or wheezing.                Abd:   Soft, NT, ND, + BS, no organomegaly               Neurological: Pt is alert. At baseline orientation, motor grossly intact               Skin: Skin is warm. No rashes, no other new lesions, LE edema - none               Psychiatric: Pt behavior is normal without agitation   Micro: none  Cardiac tracings I have personally interpreted today:  none  Pertinent Radiological findings (summarize): none   Lab Results  Component Value Date   WBC 6.6 01/20/2021   HGB 15.0 01/20/2021   HCT 44.5 01/20/2021   PLT 209.0 01/20/2021   GLUCOSE 95 01/20/2021   CHOL 139 01/20/2021   TRIG 110.0 01/20/2021   HDL 47.40 01/20/2021   LDLDIRECT 116.9 04/27/2010   LDLCALC 69 01/20/2021   ALT 18 01/20/2021   AST 20 01/20/2021   NA 139 01/20/2021   K 4.8 01/20/2021   CL 103 01/20/2021   CREATININE 1.15 01/20/2021   BUN 15 01/20/2021   CO2 28 01/20/2021   TSH 1.86 01/20/2021   PSA 1.80 01/20/2021   INR 1.0 ratio 06/03/2010   HGBA1C 5.7 01/20/2021   Assessment/Plan:  [name] [name] is a 75 y.o. White or Caucasian [1] male with  has a past medical history of Allergic rhinitis, Anxiety (05/01/2011), Atherosclerosis of aorta (HCC), BPH (benign prostatic hyperplasia), CAD (coronary artery disease), Chronic prostatitis, Diverticulitis, DJD (degenerative joint disease), lumbosacral, Hearing loss in right ear, HLD (hyperlipidemia), HTN (hypertension), Inguinal hernia without mention of obstruction or gangrene, unilateral or unspecified, (not specified as recurrent), Ocular migraine, Old myocardial infarction (1989), PUD (peptic ulcer disease), and Transient amnesia.  Hyperlipemia Lab Results  Component Value Date   LDLCALC 76 01/15/2020   Uncontrolled, goal ldl < 70, pt to continue current lipitor, consider add zetia    Insomnia Ok for temazapam qhs prn,  to f/u any worsening symptoms or concerns   Atherosclerosis of aorta Pt to continue  low chol diet, exercise, and lipitor 20  Hyperglycemia Lab  Results  Component Value Date   HGBA1C 5.7 01/20/2021   Stable, pt to continue current medical treatment  - diet   Essential hypertension BP Readings from Last 3 Encounters:  01/20/21 118/64  01/15/20 130/68  01/17/19 140/80   Stable, pt to continue medical treatment  - no med needed currently, for wt control, low salt diet, excercise  Followup: Return in about 1 year (around 01/20/2022).  Cathlean Cower, MD 01/22/2021 11:02 PM [facility]

## 2021-01-20 NOTE — Assessment & Plan Note (Addendum)
Lab Results  Component Value Date   LDLCALC 76 01/15/2020   Uncontrolled, goal ldl < 70, pt to continue current lipitor, consider add zetia

## 2021-01-20 NOTE — Patient Instructions (Signed)

## 2021-01-22 ENCOUNTER — Encounter: Payer: Self-pay | Admitting: Internal Medicine

## 2021-01-22 NOTE — Assessment & Plan Note (Signed)
BP Readings from Last 3 Encounters:  01/20/21 118/64  01/15/20 130/68  01/17/19 140/80   Stable, pt to continue medical treatment  - no med needed currently, for wt control, low salt diet, excercise

## 2021-01-22 NOTE — Assessment & Plan Note (Signed)
Ok for temazapam qhs prn,  to f/u any worsening symptoms or concerns

## 2021-01-22 NOTE — Assessment & Plan Note (Signed)
Pt to continue low chol diet, exercise, and lipitor 20

## 2021-01-22 NOTE — Assessment & Plan Note (Signed)
Lab Results  Component Value Date   HGBA1C 5.7 01/20/2021   Stable, pt to continue current medical treatment  - diet

## 2021-02-28 ENCOUNTER — Telehealth: Payer: Self-pay

## 2021-03-02 NOTE — Telephone Encounter (Signed)
Rx sent to pharmacy 01/20/21 for a year supply

## 2021-04-28 DIAGNOSIS — H833X2 Noise effects on left inner ear: Secondary | ICD-10-CM | POA: Insufficient documentation

## 2021-07-14 ENCOUNTER — Telehealth: Payer: Self-pay

## 2021-07-14 NOTE — Telephone Encounter (Signed)
PA for Temazepam 15MG  capsules...  Approved. This drug has been approved under the Member's Medicare Part D benefit. Approved quantity: 60 units per 30 day(s). You may fill up to a 90 day supply except for those on Specialty Tier 5, which can be filled up to a 30 day supply. Please call the pharmacy to process the prescription claim.

## 2021-10-27 ENCOUNTER — Telehealth: Payer: Self-pay | Admitting: Internal Medicine

## 2021-10-27 NOTE — Telephone Encounter (Signed)
LVM for pt to rtn my call to schedule AWV with [facility]. Please schedule AWV if pt calls the office  

## 2021-12-30 ENCOUNTER — Ambulatory Visit (INDEPENDENT_AMBULATORY_CARE_PROVIDER_SITE_OTHER): Payer: Medicare Other

## 2021-12-30 DIAGNOSIS — Z0000 Encounter for general adult medical examination without abnormal findings: Secondary | ICD-10-CM | POA: Diagnosis not present

## 2021-12-30 NOTE — Progress Notes (Signed)
I connected with Evonnie Pat today by telephone and verified that I am speaking with the correct person using two identifiers. Location patient: home Location provider: work Persons participating in the virtual visit: patient, provider.   I discussed the limitations, risks, security and privacy concerns of performing an evaluation and management service by telephone and the availability of in person appointments. I also discussed with the patient that there may be a patient responsible charge related to this service. The patient expressed understanding and verbally consented to this telephonic visit.    Interactive audio and video telecommunications were attempted between this provider and patient, however failed, due to patient having technical difficulties OR patient did not have access to video capability.  We continued and completed visit with audio only.  Some vital signs may be absent or patient reported.   Time Spent with patient on telephone encounter: 30 minutes  Subjective:   [name] [name] is a 76 y.o. male who presents for Medicare Annual/Subsequent preventive examination.  Review of Systems     Cardiac Risk Factors include: advanced age (>4mn, >>39women);male gender;family history of premature cardiovascular disease;dyslipidemia     Objective:    There were no vitals filed for this visit. There is no height or weight on file to calculate BMI.     12/30/2021    3:05 PM 03/15/2017   11:15 AM 04/30/2015   10:59 AM  Advanced Directives  Does Patient Have a Medical Advance Directive? Yes No Yes  Type of Advance Directive Living will;Healthcare Power of Attorney    Does patient want to make changes to medical advance directive? No - Patient declined    Copy of [place] Chart? No - copy requested  Yes    Current Medications (verified) Outpatient Encounter Medications as of 12/30/2021  Medication Sig   aspirin 81 MG tablet Take 81 mg by mouth daily.    atorvastatin (LIPITOR) 20 MG tablet Take 1 tablet (20 mg total) by mouth daily.   fluticasone (FLONASE) 50 MCG/ACT nasal spray Place 2 sprays into the nose daily as needed.   montelukast (SINGULAIR) 10 MG tablet 1 tab by mouth once daily   temazepam (RESTORIL) 15 MG capsule 1-2 tab by mouth at bedtime as needed for sleep   No facility-administered encounter medications on file as of 12/30/2021.    Allergies (verified) Niacin, Other, and Penicillins   History: Past Medical History:  Diagnosis Date   Allergic rhinitis    Anxiety 05/01/2011   Atherosclerosis of aorta (HCC)    BPH (benign prostatic hyperplasia)    CAD (coronary artery disease)    Chronic prostatitis    Diverticulitis    DJD (degenerative joint disease), lumbosacral    Hearing loss in right ear    HLD (hyperlipidemia)    HTN (hypertension)    Inguinal hernia without mention of obstruction or gangrene, unilateral or unspecified, (not specified as recurrent)    Ocular migraine    Old myocardial infarction 1989   PUD (peptic ulcer disease)    Transient amnesia    Past Surgical History:  Procedure Laterality Date   APPENDECTOMY     CORONARY STENT PLACEMENT     INGUINAL HERNIA REPAIR     Family History  Problem Relation Age of Onset   Alcohol abuse Father    Cancer Father    Alcohol abuse Brother    Cancer Unknown        grandfather   Hypertension Unknown  grandfather   Heart disease Unknown        grandfather   Colon cancer Neg Hx    Stomach cancer Neg Hx    Social History   Socioeconomic History   Marital status: Married    Spouse name: Not on file   Number of children: 3   Years of education: Not on file   Highest education level: Not on file  Occupational History   Occupation: retired  Tobacco Use   Smoking status: Former    Types: Cigarettes    Quit date: 07/03/1980    Years since quitting: 41.5   Smokeless tobacco: Never  Substance and Sexual Activity   Alcohol use: Yes     Alcohol/week: 0.0 standard drinks of alcohol    Comment: a few beers or glasses of wine a week prior to dinner   Drug use: No   Sexual activity: Not on file  Other Topics Concern   Not on file  Social History Narrative   Not on file   Social Determinants of Health   Financial Resource Strain: Low Risk  (12/30/2021)   Overall Financial Resource Strain (CARDIA)    Difficulty of Paying Living Expenses: Not hard at all  Food Insecurity: No Food Insecurity (12/30/2021)   Hunger Vital Sign    Worried About Running Out of Food in the Last Year: Never true    [place] in the Last Year: Never true  Transportation Needs: No Transportation Needs (12/30/2021)   PRAPARE - Hydrologist (Medical): No    Lack of Transportation (Non-Medical): No  Physical Activity: Sufficiently Active (12/30/2021)   Exercise Vital Sign    Days of Exercise per Week: 5 days    Minutes of Exercise per Session: 30 min  Stress: No Stress Concern Present (12/30/2021)   [place]    Feeling of Stress : Not at all  Social Connections: Unknown (12/30/2021)   Social Connection and Isolation Panel [NHANES]    Frequency of Communication with Friends and Family: Patient refused    Frequency of Social Gatherings with Friends and Family: Patient refused    Attends Religious Services: Patient refused    Marine scientist or Organizations: Patient refused    Attends Music therapist: Patient refused    Marital Status: Patient refused    Tobacco Counseling Counseling given: Not Answered   Clinical Intake:  Pre-visit preparation completed: Yes  Pain : No/denies pain     Nutritional Risks: None Diabetes: No  How often do you need to have someone help you when you read instructions, pamphlets, or other written materials from your doctor or pharmacy?: 1 - Never  Diabetic? no  Interpreter Needed?:  No  Information entered by :: Lisette Abu, LPN.   Activities of Daily Living    12/30/2021    3:20 PM 01/20/2021   10:05 AM  In your present state of health, do you have any difficulty performing the following activities:  Hearing? 1 0  Vision? 0 0  Difficulty concentrating or making decisions? 0 0  Walking or climbing stairs? 0 0  Dressing or bathing? 0 0  Doing errands, shopping? 0 0  Preparing Food and eating ? N   Using the Toilet? N   In the past six months, have you accidently leaked urine? N   Do you have problems with loss of bowel control? N   Managing your Medications?  N   Managing your Finances? N   Housekeeping or managing your Housekeeping? N     Patient Care Team: Biagio Borg, MD as PCP - General  Indicate any recent Medical Services you may have received from other than [facility] providers in the past year (date may be approximate).     Assessment:   This is a routine wellness examination for [name] [name].  Hearing/Vision screen Hearing Screening - Comments:: Patient wears hearing aids. Vision Screening - Comments:: Patient wears readers for small print. Eye exam done by: [facility]  Dietary issues and exercise activities discussed: Current Exercise Habits: Home exercise routine, Type of exercise: walking, Time (Minutes): 30, Frequency (Times/Week): 5, Weekly Exercise (Minutes/Week): 150, Intensity: Moderate, Exercise limited by: None identified   Goals Addressed             This Visit's Progress    To stay alive.        Depression Screen    12/30/2021    3:17 PM 01/20/2021   10:41 AM 01/20/2021   10:05 AM 01/17/2019   11:02 AM 07/18/2018   11:08 AM 10/02/2017   10:48 AM 09/28/2016    9:48 AM  PHQ 2/9 Scores  PHQ - 2 Score 0 1 0 1 1 0 0    Fall Risk    12/30/2021    3:05 PM 01/20/2021   10:41 AM 01/20/2021   10:05 AM 01/15/2020   11:33 AM 01/17/2019   11:02 AM  Fall Risk   Falls in the past year? 0 0 0 0 0  Number falls in  past yr: 0 0 0 0   Injury with Fall? 0 0 0 0   Risk for fall due to : No Fall Risks   No Fall Risks   Follow up Falls evaluation completed   Falls evaluation completed     FALL RISK PREVENTION PERTAINING TO THE HOME:  Any stairs in or around the home? Yes  If so, are there any without handrails? No  Home free of loose throw rugs in walkways, pet beds, electrical cords, etc? Yes  Adequate lighting in your home to reduce risk of falls? Yes   ASSISTIVE DEVICES UTILIZED TO PREVENT FALLS:  Life alert? No  Use of a cane, walker or w/c? No  Grab bars in the bathroom? Yes  Shower chair or bench in shower? No  Elevated toilet seat or a handicapped toilet? No   TIMED UP AND GO:  Was the test performed? No .  Length of time to ambulate 10 feet: n/a sec.   Appearance of gait: Patient not evaluated for gait during this visit.  Cognitive Function:        12/30/2021    3:21 PM  6CIT Screen  What Year? 0 points  What month? 0 points  What time? 0 points  Count back from 20 0 points  Months in reverse 0 points  Repeat phrase 0 points  Total Score 0 points    Immunizations Immunization History  Administered Date(s) Administered   Fluad Quad(high Dose 65+) 03/21/2019   Influenza Whole 03/24/2010, 03/11/2012, 03/13/2013   Influenza, High Dose Seasonal PF 03/27/2018   Influenza-Unspecified 03/03/2014, 04/19/2014, 03/11/2015, 03/14/2017, 05/01/2019, 03/26/2020, 03/26/2020   Moderna Sars-Covid-2 Vaccination 07/28/2019, 08/25/2019, 04/23/2020, 10/25/2020   Pneumococcal Conjugate-13 07/11/2013, 04/19/2014   Pneumococcal Polysaccharide-23 03/19/2009   Td 03/19/2009   Tdap 01/17/2019   Zoster, Live 02/20/2006    TDAP status: Up to date  Flu Vaccine status:  Up to date  Pneumococcal vaccine status: Up to date  Covid-19 vaccine status: Completed vaccines  Qualifies for Shingles Vaccine? Yes   Zostavax completed Yes   Shingrix Completed?: Yes  Screening Tests Health  Maintenance  Topic Date Due   INFLUENZA VACCINE  01/31/2022   TETANUS/TDAP  01/16/2029   Pneumonia Vaccine 57+ Years old  Completed   COVID-19 Vaccine  Completed   Hepatitis C Screening  Completed   Zoster Vaccines- Shingrix  Completed   HPV VACCINES  Aged Out   COLONOSCOPY (Pts 45-22yr Insurance coverage will need to be confirmed)  Discontinued    Health Maintenance  There are no preventive care reminders to display for this patient.  Colorectal cancer screening: No longer required.   Lung Cancer Screening: (Low Dose CT Chest recommended if Age 78-80years, 30 pack-year currently smoking OR have quit w/in 15years.) does not qualify.   Lung Cancer Screening Referral: no  Additional Screening:  Hepatitis C Screening: does qualify; Completed 12/29/2015  Vision Screening: Recommended annual ophthalmology exams for early detection of glaucoma and other disorders of the eye. Is the patient up to date with their annual eye exam?  Yes  Who is the provider or what is the name of the office in which the patient attends annual eye exams[place] is not established with a provider, would they like to be referred to a provider to establish care? No .   Dental Screening: Recommended annual dental exams for proper oral hygiene  Community Resource Referral / Chronic Care Management: CRR required this visit?  No   CCM required this visit?  No      Plan:     I have personally reviewed and noted the following in the patient's chart:   Medical and social history Use of alcohol, tobacco or illicit drugs  Current medications and supplements including opioid prescriptions. Patient is not currently taking opioid prescriptions. Functional ability and status Nutritional status Physical activity Advanced directives List of other physicians Hospitalizations, surgeries, and ER visits in previous 12 months Vitals Screenings to include cognitive, depression,  and falls Referrals and appointments  In addition, I have reviewed and discussed with patient certain preventive protocols, quality metrics, and best practice recommendations. A written personalized care plan for preventive services as well as general preventive health recommendations were provided to patient.     SSheral Flow LPN   68/14/4818  Nurse Notes:  There were no vitals filed for this visit. There is no height or weight on file to calculate BMI. Patient stated that he has no issues with gait or balance; does not use any assistive devices.

## 2021-12-30 NOTE — Patient Instructions (Signed)
[name] [name] , Thank you for taking time to come for your Medicare Wellness Visit. I appreciate your ongoing commitment to your health goals. Please review the following plan we discussed and let me know if I can assist you in the future.   Screening recommendations/referrals: Colonoscopy: Discontinued Recommended yearly ophthalmology/optometry visit for glaucoma screening and checkup Recommended yearly dental visit for hygiene and checkup  Vaccinations: Influenza vaccine: 03/23/2021 Pneumococcal vaccine: 03/19/2009, 04/19/2014 Tdap vaccine: 01/17/2019; due every 10 years Shingles vaccine: 07/01/2020, 09/10/2020   Covid-19: 07/28/2019, 08/25/2019, 04/23/2020, 10/25/2020, 03/30/2021  Advanced directives: Yes  Conditions/risks identified: Yes  Next appointment: Please schedule your next Medicare Wellness Visit with your Nurse Health Advisor in 1 year by calling [phone].  Preventive Care 78 Years and Older, Male Preventive care refers to lifestyle choices and visits with your health care provider that can promote health and wellness. What does preventive care include? A yearly physical exam. This is also called an annual well check. Dental exams once or twice a year. Routine eye exams. Ask your health care provider how often you should have your eyes checked. Personal lifestyle choices, including: Daily care of your teeth and gums. Regular physical activity. Eating a healthy diet. Avoiding tobacco and drug use. Limiting alcohol use. Practicing safe sex. Taking low doses of aspirin every day. Taking vitamin and mineral supplements as recommended by your health care provider. What happens during an annual well check? The services and screenings done by your health care provider during your annual well check will depend on your age, overall health, lifestyle risk factors, and family history of disease. Counseling  Your health care provider may ask you questions about your: Alcohol  use. Tobacco use. Drug use. Emotional well-being. Home and relationship well-being. Sexual activity. Eating habits. History of falls. Memory and ability to understand (cognition). Work and work Statistician. Screening  You may have the following tests or measurements: Height, weight, and BMI. Blood pressure. Lipid and cholesterol levels. These may be checked every 5 years, or more frequently if you are over 10 years old. Skin check. Lung cancer screening. You may have this screening every year starting at age 18 if you have a 30-pack-year history of smoking and currently smoke or have quit within the past 15 years. Fecal occult blood test (FOBT) of the stool. You may have this test every year starting at age 16. Flexible sigmoidoscopy or colonoscopy. You may have a sigmoidoscopy every 5 years or a colonoscopy every 10 years starting at age 73. Prostate cancer screening. Recommendations will vary depending on your family history and other risks. Hepatitis C blood test. Hepatitis B blood test. Sexually transmitted disease (STD) testing. Diabetes screening. This is done by checking your blood sugar (glucose) after you have not eaten for a while (fasting). You may have this done every 1-3 years. Abdominal aortic aneurysm (AAA) screening. You may need this if you are a current or former smoker. Osteoporosis. You may be screened starting at age 48 if you are at high risk. Talk with your health care provider about your test results, treatment options, and if necessary, the need for more tests. Vaccines  Your health care provider may recommend certain vaccines, such as: Influenza vaccine. This is recommended every year. Tetanus, diphtheria, and acellular pertussis (Tdap, Td) vaccine. You may need a Td booster every 10 years. Zoster vaccine. You may need this after age 56. Pneumococcal 13-valent conjugate (PCV13) vaccine. One dose is recommended after age 63. Pneumococcal polysaccharide  (PPSV23) vaccine. One  dose is recommended after age 27. Talk to your health care provider about which screenings and vaccines you need and how often you need them. This information is not intended to replace advice given to you by your health care provider. Make sure you discuss any questions you have with your health care provider. Document Released: 07/16/2015 Document Revised: 03/08/2016 Document Reviewed: 04/20/2015 Elsevier Interactive Patient Education  2017 [place] Prevention in the Home Falls can cause injuries. They can happen to people of all ages. There are many things you can do to make your home safe and to help prevent falls. What can I do on the outside of my home? Regularly fix the edges of walkways and driveways and fix any cracks. Remove anything that might make you trip as you walk through a door, such as a raised step or threshold. Trim any bushes or trees on the path to your home. Use bright outdoor lighting. Clear any walking paths of anything that might make someone trip, such as rocks or tools. Regularly check to see if handrails are loose or broken. Make sure that both sides of any steps have handrails. Any raised decks and porches should have guardrails on the edges. Have any leaves, snow, or ice cleared regularly. Use sand or salt on walking paths during winter. Clean up any spills in your garage right away. This includes oil or grease spills. What can I do in the bathroom? Use night lights. Install grab bars by the toilet and in the tub and shower. Do not use towel bars as grab bars. Use non-skid mats or decals in the tub or shower. If you need to sit down in the shower, use a plastic, non-slip stool. Keep the floor dry. Clean up any water that spills on the floor as soon as it happens. Remove soap buildup in the tub or shower regularly. Attach bath mats securely with double-sided non-slip rug tape. Do not have throw rugs and other things on the  floor that can make you trip. What can I do in the bedroom? Use night lights. Make sure that you have a light by your bed that is easy to reach. Do not use any sheets or blankets that are too big for your bed. They should not hang down onto the floor. Have a firm chair that has side arms. You can use this for support while you get dressed. Do not have throw rugs and other things on the floor that can make you trip. What can I do in the kitchen? Clean up any spills right away. Avoid walking on wet floors. Keep items that you use a lot in easy-to-reach places. If you need to reach something above you, use a strong step stool that has a grab bar. Keep electrical cords out of the way. Do not use floor polish or wax that makes floors slippery. If you must use wax, use non-skid floor wax. Do not have throw rugs and other things on the floor that can make you trip. What can I do with my stairs? Do not leave any items on the stairs. Make sure that there are handrails on both sides of the stairs and use them. Fix handrails that are broken or loose. Make sure that handrails are as long as the stairways. Check any carpeting to make sure that it is firmly attached to the stairs. Fix any carpet that is loose or worn. Avoid having throw rugs at the top or bottom of the stairs.  If you do have throw rugs, attach them to the floor with carpet tape. Make sure that you have a light switch at the top of the stairs and the bottom of the stairs. If you do not have them, ask someone to add them for you. What else can I do to help prevent falls? Wear shoes that: Do not have high heels. Have rubber bottoms. Are comfortable and fit you well. Are closed at the toe. Do not wear sandals. If you use a stepladder: Make sure that it is fully opened. Do not climb a closed stepladder. Make sure that both sides of the stepladder are locked into place. Ask someone to hold it for you, if possible. Clearly mark and make  sure that you can see: Any grab bars or handrails. First and last steps. Where the edge of each step is. Use tools that help you move around (mobility aids) if they are needed. These include: Canes. Walkers. Scooters. Crutches. Turn on the lights when you go into a dark area. Replace any light bulbs as soon as they burn out. Set up your furniture so you have a clear path. Avoid moving your furniture around. If any of your floors are uneven, fix them. If there are any pets around you, be aware of where they are. Review your medicines with your doctor. Some medicines can make you feel dizzy. This can increase your chance of falling. Ask your doctor what other things that you can do to help prevent falls. This information is not intended to replace advice given to you by your health care provider. Make sure you discuss any questions you have with your health care provider. Document Released: 04/15/2009 Document Revised: 11/25/2015 Document Reviewed: 07/24/2014 Elsevier Interactive Patient Education  2017 [employer].

## 2022-01-18 ENCOUNTER — Ambulatory Visit: Payer: Medicare Other | Admitting: Internal Medicine

## 2022-01-26 ENCOUNTER — Encounter: Payer: Self-pay | Admitting: Internal Medicine

## 2022-01-26 ENCOUNTER — Ambulatory Visit (INDEPENDENT_AMBULATORY_CARE_PROVIDER_SITE_OTHER): Payer: Medicare Other | Admitting: Internal Medicine

## 2022-01-26 VITALS — BP 140/70 | HR 81 | Temp 98.0°F | Ht 67.0 in | Wt 159.0 lb

## 2022-01-26 DIAGNOSIS — E559 Vitamin D deficiency, unspecified: Secondary | ICD-10-CM

## 2022-01-26 DIAGNOSIS — E538 Deficiency of other specified B group vitamins: Secondary | ICD-10-CM | POA: Diagnosis not present

## 2022-01-26 DIAGNOSIS — Z125 Encounter for screening for malignant neoplasm of prostate: Secondary | ICD-10-CM

## 2022-01-26 DIAGNOSIS — I10 Essential (primary) hypertension: Secondary | ICD-10-CM | POA: Diagnosis not present

## 2022-01-26 DIAGNOSIS — N320 Bladder-neck obstruction: Secondary | ICD-10-CM | POA: Diagnosis not present

## 2022-01-26 DIAGNOSIS — R739 Hyperglycemia, unspecified: Secondary | ICD-10-CM | POA: Diagnosis not present

## 2022-01-26 DIAGNOSIS — M5412 Radiculopathy, cervical region: Secondary | ICD-10-CM | POA: Insufficient documentation

## 2022-01-26 DIAGNOSIS — J309 Allergic rhinitis, unspecified: Secondary | ICD-10-CM

## 2022-01-26 DIAGNOSIS — E7800 Pure hypercholesterolemia, unspecified: Secondary | ICD-10-CM | POA: Diagnosis not present

## 2022-01-26 DIAGNOSIS — K5732 Diverticulitis of large intestine without perforation or abscess without bleeding: Secondary | ICD-10-CM | POA: Insufficient documentation

## 2022-01-26 DIAGNOSIS — Z461 Encounter for fitting and adjustment of hearing aid: Secondary | ICD-10-CM | POA: Insufficient documentation

## 2022-01-26 DIAGNOSIS — I259 Chronic ischemic heart disease, unspecified: Secondary | ICD-10-CM | POA: Insufficient documentation

## 2022-01-26 LAB — CBC WITH DIFFERENTIAL/PLATELET
Basophils Absolute: 0 10*3/uL (ref 0.0–0.1)
Basophils Relative: 0.6 % (ref 0.0–3.0)
Eosinophils Absolute: 0.1 10*3/uL (ref 0.0–0.7)
Eosinophils Relative: 1.3 % (ref 0.0–5.0)
HCT: 43.4 % (ref 39.0–52.0)
Hemoglobin: 14.7 g/dL (ref 13.0–17.0)
Lymphocytes Relative: 23.2 % (ref 12.0–46.0)
Lymphs Abs: 1.5 10*3/uL (ref 0.7–4.0)
MCHC: 33.9 g/dL (ref 30.0–36.0)
MCV: 94.6 fl (ref 78.0–100.0)
Monocytes Absolute: 0.8 10*3/uL (ref 0.1–1.0)
Monocytes Relative: 12.2 % — ABNORMAL HIGH (ref 3.0–12.0)
Neutro Abs: 4 10*3/uL (ref 1.4–7.7)
Neutrophils Relative %: 62.7 % (ref 43.0–77.0)
Platelets: 206 10*3/uL (ref 150.0–400.0)
RBC: 4.59 Mil/uL (ref 4.22–5.81)
RDW: 13.1 % (ref 11.5–15.5)
WBC: 6.4 10*3/uL (ref 4.0–10.5)

## 2022-01-26 LAB — HEPATIC FUNCTION PANEL
ALT: 23 U/L (ref 0–53)
AST: 22 U/L (ref 0–37)
Albumin: 4.1 g/dL (ref 3.5–5.2)
Alkaline Phosphatase: 74 U/L (ref 39–117)
Bilirubin, Direct: 0.1 mg/dL (ref 0.0–0.3)
Total Bilirubin: 0.6 mg/dL (ref 0.2–1.2)
Total Protein: 6.6 g/dL (ref 6.0–8.3)

## 2022-01-26 LAB — LIPID PANEL
Cholesterol: 143 mg/dL (ref 0–200)
HDL: 46.6 mg/dL (ref >39.00)
LDL Cholesterol: 67 mg/dL (ref 0–99)
NonHDL: 96.34
Total CHOL/HDL Ratio: 3
Triglycerides: 147 mg/dL (ref 0.0–149.0)
VLDL: 29.4 mg/dL (ref 0.0–40.0)

## 2022-01-26 LAB — URINALYSIS, ROUTINE W REFLEX MICROSCOPIC
Bilirubin Urine: NEGATIVE
Hgb urine dipstick: NEGATIVE
Ketones, ur: NEGATIVE
Leukocytes,Ua: NEGATIVE
Nitrite: NEGATIVE
RBC / HPF: NONE SEEN (ref 0–?)
Specific Gravity, Urine: 1.01 (ref 1.000–1.030)
Total Protein, Urine: NEGATIVE
Urine Glucose: NEGATIVE
Urobilinogen, UA: 0.2 (ref 0.0–1.0)
pH: 5.5 (ref 5.0–8.0)

## 2022-01-26 LAB — BASIC METABOLIC PANEL
BUN: 16 mg/dL (ref 6–23)
CO2: 29 mEq/L (ref 19–32)
Calcium: 9.1 mg/dL (ref 8.4–10.5)
Chloride: 103 mEq/L (ref 96–112)
Creatinine, Ser: 1.09 mg/dL (ref 0.40–1.50)
GFR: 65.3 mL/min (ref >60.00)
Glucose, Bld: 97 mg/dL (ref 70–99)
Potassium: 4.3 mEq/L (ref 3.5–5.1)
Sodium: 139 mEq/L (ref 135–145)

## 2022-01-26 LAB — TSH: TSH: 2.26 u[IU]/mL (ref 0.35–5.50)

## 2022-01-26 LAB — PSA: PSA: 1.15 ng/mL (ref 0.10–4.00)

## 2022-01-26 LAB — VITAMIN D 25 HYDROXY (VIT D DEFICIENCY, FRACTURES): VITD: 49.5 ng/mL (ref 30.00–100.00)

## 2022-01-26 LAB — VITAMIN B12: Vitamin B-12: 599 pg/mL (ref 211–911)

## 2022-01-26 LAB — HEMOGLOBIN A1C: Hgb A1c MFr Bld: 5.9 % (ref 4.6–6.5)

## 2022-01-26 MED ORDER — TEMAZEPAM 15 MG PO CAPS: ORAL_CAPSULE | ORAL | 5 refills | Status: DC

## 2022-01-26 MED ORDER — MONTELUKAST SODIUM 10 MG PO TABS: ORAL_TABLET | ORAL | 3 refills | Status: DC

## 2022-01-26 MED ORDER — FLUTICASONE PROPIONATE 50 MCG/ACT NA SUSP: 2.0000 | Freq: Every day | NASAL | 3 refills | Status: DC | PRN

## 2022-01-26 MED ORDER — ATORVASTATIN CALCIUM 20 MG PO TABS: 20.0000 mg | ORAL_TABLET | Freq: Every day | ORAL | 3 refills | Status: DC

## 2022-01-26 NOTE — Progress Notes (Signed)
Patient ID: [name] [name], male   DOB: 1945-12-19, 76 y.o.   MRN: [id number]        Chief Complaint: follow up HTN, HLD and hyperglycemia, low vit d       HPI:  [name] [name] is a 76 y.o. male here overall doing ok, not taking Vit d.  Pt denies chest pain, increased sob or doe, wheezing, orthopnea, PND, increased LE swelling, palpitations, dizziness or syncope.   Pt denies polydipsia, polyuria, or new focal neuro s/s.    Pt denies fever, wt loss, night sweats, loss of appetite, or other constitutional symptoms  Walks 2 miles per day .  Wt Readings from Last 3 Encounters:  01/26/22 159 lb (72.1 kg)  01/20/21 154 lb 6.4 oz (70 kg)  01/15/20 140 lb (63.5 kg)   BP Readings from Last 3 Encounters:  01/26/22 140/70  01/20/21 118/64  01/15/20 130/68         Past Medical History:  Diagnosis Date   Allergic rhinitis    Anxiety 05/01/2011   Atherosclerosis of aorta (HCC)    BPH (benign prostatic hyperplasia)    CAD (coronary artery disease)    Chronic prostatitis    Diverticulitis    DJD (degenerative joint disease), lumbosacral    Hearing loss in right ear    HLD (hyperlipidemia)    HTN (hypertension)    Inguinal hernia without mention of obstruction or gangrene, unilateral or unspecified, (not specified as recurrent)    Ocular migraine    Old myocardial infarction 1989   PUD (peptic ulcer disease)    Transient amnesia    Past Surgical History:  Procedure Laterality Date   APPENDECTOMY     CORONARY STENT PLACEMENT     INGUINAL HERNIA REPAIR      reports that he quit smoking about 41 years ago. His smoking use included cigarettes. He has never used smokeless tobacco. He reports current alcohol use. He reports that he does not use drugs. family history includes Alcohol abuse in his brother and father; Cancer in his father and unknown relative; Heart disease in his unknown relative; Hypertension in his unknown relative. Allergies  Allergen Reactions   Niacin     Hot flashes   Other Other  (See Comments)    Environmental allergies/ post nasal drip   Penicillins     As child: muscles stiffened   Current Outpatient Medications on File Prior to Visit  Medication Sig Dispense Refill   aspirin 81 MG tablet Take 81 mg by mouth daily.     No current facility-administered medications on file prior to visit.        ROS:  All others reviewed and negative.  Objective        PE:  BP 140/70 (BP Location: Right Arm, Patient Position: Sitting, Cuff Size: Normal)   Pulse 81   Temp 98 F (36.7 C) (Oral)   Ht '5\' 7"'$  (1.702 m)   Wt 159 lb (72.1 kg)   SpO2 98%   BMI 24.90 kg/m                 Constitutional: Pt appears in NAD               HENT: Head: NCAT.                Right Ear: External ear normal.                 Left Ear: External ear normal.  Eyes: . Pupils are equal, round, and reactive to light. Conjunctivae and EOM are normal               Nose: without d/c or deformity               Neck: Neck supple. Gross normal ROM               Cardiovascular: Normal rate and regular rhythm.                 Pulmonary/Chest: Effort normal and breath sounds without rales or wheezing.                Abd:  Soft, NT, ND, + BS, no organomegaly               Neurological: Pt is alert. At baseline orientation, motor grossly intact               Skin: Skin is warm. No rashes, no other new lesions, LE edema - none               Psychiatric: Pt behavior is normal without agitation   Micro: none  Cardiac tracings I have personally interpreted today:  none  Pertinent Radiological findings (summarize): none   Lab Results  Component Value Date   WBC 6.4 01/26/2022   HGB 14.7 01/26/2022   HCT 43.4 01/26/2022   PLT 206.0 01/26/2022   GLUCOSE 97 01/26/2022   CHOL 143 01/26/2022   TRIG 147.0 01/26/2022   HDL 46.60 01/26/2022   LDLDIRECT 116.9 04/27/2010   LDLCALC 67 01/26/2022   ALT 23 01/26/2022   AST 22 01/26/2022   NA 139 01/26/2022   K 4.3 01/26/2022   CL 103  01/26/2022   CREATININE 1.09 01/26/2022   BUN 16 01/26/2022   CO2 29 01/26/2022   TSH 2.26 01/26/2022   PSA 1.15 01/26/2022   INR 1.0 ratio 06/03/2010   HGBA1C 5.9 01/26/2022   Assessment/Plan:  [name] [name] is a 76 y.o. White or Caucasian [1] male with  has a past medical history of Allergic rhinitis, Anxiety (05/01/2011), Atherosclerosis of aorta (HCC), BPH (benign prostatic hyperplasia), CAD (coronary artery disease), Chronic prostatitis, Diverticulitis, DJD (degenerative joint disease), lumbosacral, Hearing loss in right ear, HLD (hyperlipidemia), HTN (hypertension), Inguinal hernia without mention of obstruction or gangrene, unilateral or unspecified, (not specified as recurrent), Ocular migraine, Old myocardial infarction (1989), PUD (peptic ulcer disease), and Transient amnesia.  Vitamin D deficiency Last vitamin D Lab Results  Component Value Date   VD25OH 39.15 01/20/2021   Low, to start oral replacement   Hyperlipemia Lab Results  Component Value Date   LDLCALC 67 01/26/2022   Stable, pt to continue current statin lipitor 20 mg qd   Hyperglycemia Lab Results  Component Value Date   HGBA1C 5.9 01/26/2022   Stable, pt to continue current medical treatment  - diet, wt control, excercise   Essential hypertension BP Readings from Last 3 Encounters:  01/26/22 140/70  01/20/21 118/64  01/15/20 130/68   Stable, pt to continue medical treatment  - diet, low salt, wt control, excercise  Followup: Return in about 1 year (around 01/27/2023).  Cathlean Cower, MD 01/28/2022 3:54 PM [facility]

## 2022-01-26 NOTE — Patient Instructions (Signed)

## 2022-01-26 NOTE — Assessment & Plan Note (Signed)
Last vitamin D Lab Results  Component Value Date   VD25OH 39.15 01/20/2021   Low, to start oral replacement

## 2022-01-28 ENCOUNTER — Encounter: Payer: Self-pay | Admitting: Internal Medicine

## 2022-01-28 NOTE — Assessment & Plan Note (Signed)
BP Readings from Last 3 Encounters:  01/26/22 140/70  01/20/21 118/64  01/15/20 130/68   Stable, pt to continue medical treatment  - diet, low salt, wt control, excercise

## 2022-01-28 NOTE — Assessment & Plan Note (Signed)
Lab Results  Component Value Date   LDLCALC 67 01/26/2022   Stable, pt to continue current statin lipitor 20 mg qd

## 2022-01-28 NOTE — Assessment & Plan Note (Signed)
Lab Results  Component Value Date   HGBA1C 5.9 01/26/2022   Stable, pt to continue current medical treatment  - diet, wt control, excercise  

## 2022-02-07 ENCOUNTER — Encounter: Payer: Self-pay | Admitting: Internal Medicine

## 2022-07-31 ENCOUNTER — Other Ambulatory Visit: Payer: Self-pay

## 2022-07-31 MED ORDER — TEMAZEPAM 15 MG PO CAPS: ORAL_CAPSULE | ORAL | 5 refills | Status: DC

## 2022-07-31 NOTE — Telephone Encounter (Signed)
Received incoming fax for refill request. LOV: 01/26/22

## 2023-01-02 ENCOUNTER — Telehealth: Payer: Self-pay | Admitting: Internal Medicine

## 2023-01-02 NOTE — Telephone Encounter (Signed)
Contacted [name] [name] to schedule their annual wellness visit. Patient declined to schedule AWV at this time. Please discuss with patient at next appointment.   [facility] Care Guide [facility] AWV TEAM Direct Dial: [phone]

## 2023-02-07 ENCOUNTER — Ambulatory Visit (INDEPENDENT_AMBULATORY_CARE_PROVIDER_SITE_OTHER): Payer: Medicare Other | Admitting: Internal Medicine

## 2023-02-07 VITALS — BP 138/72 | HR 68 | Temp 98.7°F | Ht 67.0 in | Wt 160.0 lb

## 2023-02-07 DIAGNOSIS — E559 Vitamin D deficiency, unspecified: Secondary | ICD-10-CM | POA: Diagnosis not present

## 2023-02-07 DIAGNOSIS — R739 Hyperglycemia, unspecified: Secondary | ICD-10-CM | POA: Diagnosis not present

## 2023-02-07 DIAGNOSIS — E538 Deficiency of other specified B group vitamins: Secondary | ICD-10-CM | POA: Diagnosis not present

## 2023-02-07 DIAGNOSIS — J309 Allergic rhinitis, unspecified: Secondary | ICD-10-CM

## 2023-02-07 DIAGNOSIS — E7800 Pure hypercholesterolemia, unspecified: Secondary | ICD-10-CM | POA: Diagnosis not present

## 2023-02-07 DIAGNOSIS — N320 Bladder-neck obstruction: Secondary | ICD-10-CM

## 2023-02-07 DIAGNOSIS — I10 Essential (primary) hypertension: Secondary | ICD-10-CM | POA: Diagnosis not present

## 2023-02-07 LAB — LIPID PANEL
Cholesterol: 163 mg/dL (ref 0–200)
HDL: 47.7 mg/dL (ref >39.00)
LDL Cholesterol: 80 mg/dL (ref 0–99)
NonHDL: 115.28
Total CHOL/HDL Ratio: 3
Triglycerides: 177 mg/dL — ABNORMAL HIGH (ref 0.0–149.0)
VLDL: 35.4 mg/dL (ref 0.0–40.0)

## 2023-02-07 LAB — HEPATIC FUNCTION PANEL
ALT: 38 U/L (ref 0–53)
AST: 28 U/L (ref 0–37)
Albumin: 4.3 g/dL (ref 3.5–5.2)
Alkaline Phosphatase: 69 U/L (ref 39–117)
Bilirubin, Direct: 0.1 mg/dL (ref 0.0–0.3)
Total Bilirubin: 0.7 mg/dL (ref 0.2–1.2)
Total Protein: 6.8 g/dL (ref 6.0–8.3)

## 2023-02-07 LAB — URINALYSIS, ROUTINE W REFLEX MICROSCOPIC
Bilirubin Urine: NEGATIVE
Hgb urine dipstick: NEGATIVE
Ketones, ur: NEGATIVE
Leukocytes,Ua: NEGATIVE
Nitrite: NEGATIVE
RBC / HPF: NONE SEEN (ref 0–?)
Specific Gravity, Urine: <=1.005 — AB (ref 1.000–1.030)
Total Protein, Urine: NEGATIVE
Urine Glucose: NEGATIVE
Urobilinogen, UA: 0.2 (ref 0.0–1.0)
WBC, UA: NONE SEEN (ref 0–?)
pH: 6 (ref 5.0–8.0)

## 2023-02-07 LAB — CBC WITH DIFFERENTIAL/PLATELET
Basophils Absolute: 0 10*3/uL (ref 0.0–0.1)
Basophils Relative: 0.3 % (ref 0.0–3.0)
Eosinophils Absolute: 0.1 10*3/uL (ref 0.0–0.7)
Eosinophils Relative: 1.2 % (ref 0.0–5.0)
HCT: 44.6 % (ref 39.0–52.0)
Hemoglobin: 14.8 g/dL (ref 13.0–17.0)
Lymphocytes Relative: 24.9 % (ref 12.0–46.0)
Lymphs Abs: 1.7 10*3/uL (ref 0.7–4.0)
MCHC: 33.2 g/dL (ref 30.0–36.0)
MCV: 95.8 fl (ref 78.0–100.0)
Monocytes Absolute: 0.8 10*3/uL (ref 0.1–1.0)
Monocytes Relative: 12.1 % — ABNORMAL HIGH (ref 3.0–12.0)
Neutro Abs: 4.2 10*3/uL (ref 1.4–7.7)
Neutrophils Relative %: 61.5 % (ref 43.0–77.0)
Platelets: 218 10*3/uL (ref 150.0–400.0)
RBC: 4.65 Mil/uL (ref 4.22–5.81)
RDW: 12.6 % (ref 11.5–15.5)
WBC: 6.8 10*3/uL (ref 4.0–10.5)

## 2023-02-07 LAB — BASIC METABOLIC PANEL
BUN: 15 mg/dL (ref 6–23)
CO2: 29 mEq/L (ref 19–32)
Calcium: 9.2 mg/dL (ref 8.4–10.5)
Chloride: 103 mEq/L (ref 96–112)
Creatinine, Ser: 1.07 mg/dL (ref 0.40–1.50)
GFR: 66.28 mL/min (ref >60.00)
Glucose, Bld: 85 mg/dL (ref 70–99)
Potassium: 4.1 mEq/L (ref 3.5–5.1)
Sodium: 139 mEq/L (ref 135–145)

## 2023-02-07 LAB — MICROALBUMIN / CREATININE URINE RATIO
Creatinine,U: 42.4 mg/dL
Microalb Creat Ratio: 1.7 mg/g (ref 0.0–30.0)
Microalb, Ur: <0.7 mg/dL (ref 0.0–1.9)

## 2023-02-07 LAB — HEMOGLOBIN A1C: Hgb A1c MFr Bld: 5.8 % (ref 4.6–6.5)

## 2023-02-07 LAB — VITAMIN B12: Vitamin B-12: 492 pg/mL (ref 211–911)

## 2023-02-07 LAB — PSA: PSA: 1.68 ng/mL (ref 0.10–4.00)

## 2023-02-07 LAB — VITAMIN D 25 HYDROXY (VIT D DEFICIENCY, FRACTURES): VITD: 59.59 ng/mL (ref 30.00–100.00)

## 2023-02-07 LAB — TSH: TSH: 2 u[IU]/mL (ref 0.35–5.50)

## 2023-02-07 MED ORDER — ATORVASTATIN CALCIUM 20 MG PO TABS: 20.0000 mg | ORAL_TABLET | Freq: Every day | ORAL | 3 refills | Status: DC

## 2023-02-07 MED ORDER — TEMAZEPAM 15 MG PO CAPS: ORAL_CAPSULE | ORAL | 5 refills | Status: AC

## 2023-02-07 MED ORDER — MONTELUKAST SODIUM 10 MG PO TABS: ORAL_TABLET | ORAL | 3 refills | Status: DC

## 2023-02-07 MED ORDER — FLUTICASONE PROPIONATE 50 MCG/ACT NA SUSP: 2.0000 | Freq: Every day | NASAL | 3 refills | Status: DC | PRN

## 2023-02-07 NOTE — Progress Notes (Signed)
The test results show that your current treatment is OK, as the tests are stable.  Please continue the same plan.  There is no other need for change of treatment or further evaluation based on these results, at this time.  thanks 

## 2023-02-07 NOTE — Progress Notes (Unsigned)
Patient ID: [name] [name], male   DOB: [birth year], 77 y.o.   MRN: [id number]         Chief Complaint:: yearly exam       HPI:  [name] [name] is a 77 y.o. male here overall doing ok.  Pt denies chest pain, increased sob or doe, wheezing, orthopnea, PND, increased LE swelling, palpitations, dizziness or syncope.   Pt denies polydipsia, polyuria, or new focal neuro s/s.    Pt denies fever, wt loss, night sweats, loss of appetite, or other constitutional symptoms  Walking 3-4 miles per day on the walking track.  Denies urinary symptoms such as dysuria, frequency, urgency, flank pain, hematuria or n/v, fever, chills. Does have several wks ongoing nasal allergy symptoms with clearish congestion, itch and sneezing, without fever, pain, ST, cough, swelling or wheezing   Wt Readings from Last 3 Encounters:  02/07/23 160 lb (72.6 kg)  01/26/22 159 lb (72.1 kg)  01/20/21 154 lb 6.4 oz (70 kg)   BP Readings from Last 3 Encounters:  02/07/23 138/72  01/26/22 140/70  01/20/21 118/64   Immunization History  Administered Date(s) Administered   Fluad Quad(high Dose 65+) 03/21/2019   Influenza Split 04/02/2013   Influenza Whole 03/24/2010, 03/11/2012, 03/13/2013   Influenza, High Dose Seasonal PF 05/03/2009, 03/24/2010, 03/03/2016, 03/27/2018   Influenza-Unspecified 03/02/2011, 03/03/2012, 03/03/2014, 04/19/2014, 03/11/2015, 03/14/2017, 04/03/2019, 05/01/2019, 03/26/2020, 03/26/2020, 04/02/2021   Moderna Covid-19 Vaccine Bivalent Booster 67yrs & up 03/30/2021   Moderna Sars-Covid-2 Vaccination 07/16/2019, 07/28/2019, 08/13/2019, 08/25/2019, 04/23/2020, 10/25/2020   Pneumococcal Conjugate-13 07/11/2013, 04/19/2014   Pneumococcal Polysaccharide-23 03/19/2009   Pneumococcal-Unspecified 12/01/2008   Td 12/22/2003, 03/19/2009   Tdap 04/02/2010, 01/17/2019   Tetanus 09/01/2007   Zoster Recombinant(Shingrix) 07/01/2020, 09/10/2020   Zoster, Live 02/20/2006   Health Maintenance Due  Topic Date Due   Medicare  Annual Wellness (AWV)  12/31/2022   INFLUENZA VACCINE  02/01/2023      Past Medical History:  Diagnosis Date   Allergic rhinitis    Anxiety 05/01/2011   Atherosclerosis of aorta (HCC)    BPH (benign prostatic hyperplasia)    CAD (coronary artery disease)    Chronic prostatitis    Diverticulitis    DJD (degenerative joint disease), lumbosacral    Hearing loss in right ear    HLD (hyperlipidemia)    HTN (hypertension)    Inguinal hernia without mention of obstruction or gangrene, unilateral or unspecified, (not specified as recurrent)    Ocular migraine    Old myocardial infarction 1989   PUD (peptic ulcer disease)    Transient amnesia    Past Surgical History:  Procedure Laterality Date   APPENDECTOMY     CORONARY STENT PLACEMENT     INGUINAL HERNIA REPAIR      reports that he quit smoking about 42 years ago. His smoking use included cigarettes. He has never used smokeless tobacco. He reports current alcohol use. He reports that he does not use drugs. family history includes Alcohol abuse in his brother and father; Cancer in his father and unknown relative; Heart disease in his unknown relative; Hypertension in his unknown relative. Allergies  Allergen Reactions   Niacin     Hot flashes   Other Other (See Comments)    Environmental allergies/ post nasal drip   Penicillins     As child: muscles stiffened   Current Outpatient Medications on File Prior to Visit  Medication Sig Dispense Refill   aspirin 81 MG tablet Take 81 mg by mouth daily.  No current facility-administered medications on file prior to visit.        ROS:  All others reviewed and negative.  Objective        PE:  BP 138/72   Pulse 68   Temp 98.7 F (37.1 C) (Temporal)   Ht 5\' 7"  (1.702 m)   Wt 160 lb (72.6 kg)   SpO2 98%   BMI 25.06 kg/m                 Constitutional: Pt appears in NAD               HENT: Head: NCAT.                Right Ear: External ear normal.                 Left  Ear: External ear normal.                Eyes: . Pupils are equal, round, and reactive to light. Conjunctivae and EOM are normal               Nose: without d/c or deformity               Neck: Neck supple. Gross normal ROM               Cardiovascular: Normal rate and regular rhythm.                 Pulmonary/Chest: Effort normal and breath sounds without rales or wheezing.                Abd:  Soft, NT, ND, + BS, no organomegaly               Neurological: Pt is alert. At baseline orientation, motor grossly intact               Skin: Skin is warm. No rashes, no other new lesions, LE edema - none               Psychiatric: Pt behavior is normal without agitation   Micro: none  Cardiac tracings I have personally interpreted today:  none  Pertinent Radiological findings (summarize): none   Lab Results  Component Value Date   WBC 6.8 02/07/2023   HGB 14.8 02/07/2023   HCT 44.6 02/07/2023   PLT 218.0 02/07/2023   GLUCOSE 85 02/07/2023   CHOL 163 02/07/2023   TRIG 177.0 (H) 02/07/2023   HDL 47.70 02/07/2023   LDLDIRECT 116.9 04/27/2010   LDLCALC 80 02/07/2023   ALT 38 02/07/2023   AST 28 02/07/2023   NA 139 02/07/2023   K 4.1 02/07/2023   CL 103 02/07/2023   CREATININE 1.07 02/07/2023   BUN 15 02/07/2023   CO2 29 02/07/2023   TSH 2.00 02/07/2023   PSA 1.68 02/07/2023   INR 1.0 ratio 06/03/2010   HGBA1C 5.8 02/07/2023   MICROALBUR <0.7 02/07/2023   Assessment/Plan:  [name] [name] is a 77 y.o. White or Caucasian [1] male with  has a past medical history of Allergic rhinitis, Anxiety (05/01/2011), Atherosclerosis of aorta (HCC), BPH (benign prostatic hyperplasia), CAD (coronary artery disease), Chronic prostatitis, Diverticulitis, DJD (degenerative joint disease), lumbosacral, Hearing loss in right ear, HLD (hyperlipidemia), HTN (hypertension), Inguinal hernia without mention of obstruction or gangrene, unilateral or unspecified, (not specified as recurrent), Ocular migraine, Old  myocardial infarction (1989), PUD (peptic ulcer disease), and Transient amnesia.  Hyperlipemia Lab Results  Component Value  Date   LDLCALC 80 02/07/2023   Uncontrolled, goal ldl < 70,, pt to continue current statin lipitor 20 mg and lower chol diet, delcines other change for now   Hyperglycemia Lab Results  Component Value Date   HGBA1C 5.8 02/07/2023   Stable, pt to continue current medical treatment - diet, wt control   Vitamin D deficiency Last vitamin D Lab Results  Component Value Date   VD25OH 59.59 02/07/2023   Stable, cont oral replacement   Essential hypertension BP Readings from Last 3 Encounters:  02/07/23 138/72  01/26/22 140/70  01/20/21 118/64   Stable, pt to continue medical treatment  - diet, wt control   Bladder neck obstruction Asympt, for f/u psa  Allergic rhinitis Mild to mod, for restart singulair 10 every day and flonase asd,,  to f/u any worsening symptoms or concerns  Followup: Return in about 1 year (around 02/07/2024).  Oliver Barre, MD 02/08/2023 8:52 PM [facility]

## 2023-02-07 NOTE — Patient Instructions (Signed)

## 2023-02-08 ENCOUNTER — Encounter: Payer: Self-pay | Admitting: Internal Medicine

## 2023-02-08 NOTE — Assessment & Plan Note (Signed)
Lab Results  Component Value Date   LDLCALC 80 02/07/2023   Uncontrolled, goal ldl < 70,, pt to continue current statin lipitor 20 mg and lower chol diet, delcines other change for now

## 2023-02-08 NOTE — Assessment & Plan Note (Signed)
Asympt, for f/u psa 

## 2023-02-08 NOTE — Assessment & Plan Note (Signed)
BP Readings from Last 3 Encounters:  02/07/23 138/72  01/26/22 140/70  01/20/21 118/64   Stable, pt to continue medical treatment  - diet, wt control

## 2023-02-08 NOTE — Assessment & Plan Note (Signed)
Last vitamin D Lab Results  Component Value Date   VD25OH 59.59 02/07/2023   Stable, cont oral replacement

## 2023-02-08 NOTE — Assessment & Plan Note (Signed)
Mild to mod, for restart singulair 10 every day and flonase asd,,  to f/u any worsening symptoms or concerns

## 2023-02-08 NOTE — Assessment & Plan Note (Signed)
Lab Results  Component Value Date   HGBA1C 5.8 02/07/2023   Stable, pt to continue current medical treatment - diet, wt control

## 2023-11-05 ENCOUNTER — Ambulatory Visit (INDEPENDENT_AMBULATORY_CARE_PROVIDER_SITE_OTHER): Admitting: Internal Medicine

## 2023-11-05 ENCOUNTER — Encounter: Payer: Self-pay | Admitting: Internal Medicine

## 2023-11-05 VITALS — BP 136/74 | HR 83 | Temp 98.3°F | Ht 67.0 in | Wt 159.4 lb

## 2023-11-05 DIAGNOSIS — I10 Essential (primary) hypertension: Secondary | ICD-10-CM | POA: Diagnosis not present

## 2023-11-05 DIAGNOSIS — K4090 Unilateral inguinal hernia, without obstruction or gangrene, not specified as recurrent: Secondary | ICD-10-CM

## 2023-11-05 DIAGNOSIS — R739 Hyperglycemia, unspecified: Secondary | ICD-10-CM | POA: Diagnosis not present

## 2023-11-05 DIAGNOSIS — E559 Vitamin D deficiency, unspecified: Secondary | ICD-10-CM

## 2023-11-05 NOTE — Patient Instructions (Addendum)
 Please continue all other medications as before, and refills have been done if requested.  Please have the pharmacy call with any other refills you may need.  Please continue your efforts at being more active, low cholesterol diet, and weight control  Please keep your appointments with your specialists as you may have planned  You will be contacted regarding the referral for: General [facility] in [place]  Please make an Appointment to return in Aug 11, or sooner if needed

## 2023-11-05 NOTE — Assessment & Plan Note (Signed)
Last vitamin D Lab Results  Component Value Date   VD25OH 59.59 02/07/2023   Stable, cont oral replacement

## 2023-11-05 NOTE — Assessment & Plan Note (Signed)
 Recent new onset, fortuanately not incarcerated today, now for referral [facility] in [place] per pt request

## 2023-11-05 NOTE — Assessment & Plan Note (Signed)
 BP Readings from Last 3 Encounters:  11/05/23 136/74  02/07/23 138/72  01/26/22 140/70   Stable, pt to continue medical treatment  - diet, wt control

## 2023-11-05 NOTE — Progress Notes (Signed)
 Patient ID: [name] [name], male   DOB: 09-28-1945, 78 y.o.   MRN: [id number]        Chief Complaint: follow up with new swelling right groin, htn, hyperglycemia, low vit d       HPI:  [name] [name] is a 78 y.o. male here with c/o 1 wk onset right inguinal hernia fortunately without pain, seems to come on with doing stretches for the bilateral plantar fasciitis. Has hx of left LIH repair remotely years ago.    Pt denies chest pain, increased sob or doe, wheezing, orthopnea, PND, increased LE swelling, palpitations, dizziness or syncope.   Pt denies polydipsia, polyuria, or new focal neuro s/s.         Wt Readings from Last 3 Encounters:  11/05/23 159 lb 6 oz (72.3 kg)  02/07/23 160 lb (72.6 kg)  01/26/22 159 lb (72.1 kg)   BP Readings from Last 3 Encounters:  11/05/23 136/74  02/07/23 138/72  01/26/22 140/70         Past Medical History:  Diagnosis Date   Allergic rhinitis    Anxiety 05/01/2011   Atherosclerosis of aorta (HCC)    BPH (benign prostatic hyperplasia)    CAD (coronary artery disease)    Chronic prostatitis    Diverticulitis    DJD (degenerative joint disease), lumbosacral    Hearing loss in right ear    HLD (hyperlipidemia)    HTN (hypertension)    Inguinal hernia without mention of obstruction or gangrene, unilateral or unspecified, (not specified as recurrent)    Ocular migraine    Old myocardial infarction 1989   PUD (peptic ulcer disease)    Transient amnesia    Past Surgical History:  Procedure Laterality Date   APPENDECTOMY     CORONARY STENT PLACEMENT     INGUINAL HERNIA REPAIR      reports that he quit smoking about 43 years ago. His smoking use included cigarettes. He has never used smokeless tobacco. He reports current alcohol use. He reports that he does not use drugs. family history includes Alcohol abuse in his brother and father; Cancer in his father and unknown relative; Heart disease in his unknown relative; Hypertension in his unknown  relative. Allergies  Allergen Reactions   Niacin     Hot flashes   Other Other (See Comments)    Environmental allergies/ post nasal drip   Penicillins     As child: muscles stiffened   Current Outpatient Medications on File Prior to Visit  Medication Sig Dispense Refill   aspirin 81 MG tablet Take 81 mg by mouth daily.     atorvastatin  (LIPITOR) 20 MG tablet Take 1 tablet (20 mg total) by mouth daily. 90 tablet 3   fluticasone  (FLONASE ) 50 MCG/ACT nasal spray Place 2 sprays into both nostrils daily as needed. Place 2 sprays into the nose daily as needed. 45 mL 3   montelukast  (SINGULAIR ) 10 MG tablet 1 tab by mouth once daily 90 tablet 3   temazepam  (RESTORIL ) 15 MG capsule 1-2 tab by mouth at bedtime as needed for sleep 60 capsule 5   No current facility-administered medications on file prior to visit.        ROS:  All others reviewed and negative.  Objective        PE:  BP 136/74   Pulse 83   Temp 98.3 F (36.8 C) (Temporal)   Ht 5\' 7"  (1.702 m)   Wt 159 lb 6 oz (72.3 kg)   SpO2  98%   BMI 24.96 kg/m                 Constitutional: Pt appears in NAD               HENT: Head: NCAT.                Right Ear: External ear normal.                 Left Ear: External ear normal.                Eyes: . Pupils are equal, round, and reactive to light. Conjunctivae and EOM are normal               Nose: without d/c or deformity               Neck: Neck supple. Gross normal ROM               Cardiovascular: Normal rate and regular rhythm.                 Pulmonary/Chest: Effort normal and breath sounds without rales or wheezing.                Abd:  Soft, NT, ND, + BS, no organomegaly; Right groin with mod sized nontender but non easily reduced RIH               Neurological: Pt is alert. At baseline orientation, motor grossly intact               Skin: Skin is warm. No rashes, no other new lesions, LE edema - none               Psychiatric: Pt behavior is normal without  agitation   Micro: none  Cardiac tracings I have personally interpreted today:  none  Pertinent Radiological findings (summarize): none   Lab Results  Component Value Date   WBC 6.8 02/07/2023   HGB 14.8 02/07/2023   HCT 44.6 02/07/2023   PLT 218.0 02/07/2023   GLUCOSE 85 02/07/2023   CHOL 163 02/07/2023   TRIG 177.0 (H) 02/07/2023   HDL 47.70 02/07/2023   LDLDIRECT 116.9 04/27/2010   LDLCALC 80 02/07/2023   ALT 38 02/07/2023   AST 28 02/07/2023   NA 139 02/07/2023   K 4.1 02/07/2023   CL 103 02/07/2023   CREATININE 1.07 02/07/2023   BUN 15 02/07/2023   CO2 29 02/07/2023   TSH 2.00 02/07/2023   PSA 1.68 02/07/2023   INR 1.0 ratio 06/03/2010   HGBA1C 5.8 02/07/2023   MICROALBUR <0.7 02/07/2023   Assessment/Plan:  [name] [name] is a 78 y.o. White or Caucasian [1] male with  has a past medical history of Allergic rhinitis, Anxiety (05/01/2011), Atherosclerosis of aorta (HCC), BPH (benign prostatic hyperplasia), CAD (coronary artery disease), Chronic prostatitis, Diverticulitis, DJD (degenerative joint disease), lumbosacral, Hearing loss in right ear, HLD (hyperlipidemia), HTN (hypertension), Inguinal hernia without mention of obstruction or gangrene, unilateral or unspecified, (not specified as recurrent), Ocular migraine, Old myocardial infarction (1989), PUD (peptic ulcer disease), and Transient amnesia.  Right inguinal hernia Recent new onset, fortuanately not incarcerated today, now for referral [facility] in [place] per pt request  Essential hypertension BP Readings from Last 3 Encounters:  11/05/23 136/74  02/07/23 138/72  01/26/22 140/70   Stable, pt to continue medical treatment  - diet, wt control   Hyperglycemia Lab Results  Component Value Date  HGBA1C 5.8 02/07/2023   Stable, pt to continue current medical treatment  - diet, wt control   Vitamin D  deficiency Last vitamin D  Lab Results  Component Value Date   VD25OH 59.59 02/07/2023   Stable,  cont oral replacement  Followup: Return in about 14 weeks (around 02/11/2024).  Rosalia Colonel, MD 11/05/2023 2:18 PM [facility]

## 2023-11-05 NOTE — Assessment & Plan Note (Signed)
Lab Results  Component Value Date   HGBA1C 5.8 02/07/2023   Stable, pt to continue current medical treatment - diet, wt control

## 2023-11-12 ENCOUNTER — Encounter: Payer: Self-pay | Admitting: Internal Medicine

## 2024-01-27 ENCOUNTER — Other Ambulatory Visit: Payer: Self-pay | Admitting: Internal Medicine

## 2024-01-28 ENCOUNTER — Encounter: Payer: Self-pay | Admitting: Internal Medicine

## 2024-02-04 ENCOUNTER — Other Ambulatory Visit: Payer: Self-pay

## 2024-02-04 MED ORDER — ATORVASTATIN CALCIUM 20 MG PO TABS: 20.0000 mg | ORAL_TABLET | Freq: Every day | ORAL | 3 refills | Status: AC

## 2024-02-11 ENCOUNTER — Encounter: Payer: Self-pay | Admitting: Internal Medicine

## 2024-02-11 ENCOUNTER — Ambulatory Visit: Payer: Medicare Other | Admitting: Internal Medicine

## 2024-02-11 ENCOUNTER — Ambulatory Visit: Payer: Self-pay | Admitting: Internal Medicine

## 2024-02-11 VITALS — BP 128/70 | HR 73 | Temp 97.9°F | Ht 67.0 in | Wt 154.0 lb

## 2024-02-11 DIAGNOSIS — J309 Allergic rhinitis, unspecified: Secondary | ICD-10-CM

## 2024-02-11 DIAGNOSIS — I10 Essential (primary) hypertension: Secondary | ICD-10-CM | POA: Diagnosis not present

## 2024-02-11 DIAGNOSIS — N320 Bladder-neck obstruction: Secondary | ICD-10-CM

## 2024-02-11 DIAGNOSIS — E559 Vitamin D deficiency, unspecified: Secondary | ICD-10-CM | POA: Diagnosis not present

## 2024-02-11 DIAGNOSIS — E538 Deficiency of other specified B group vitamins: Secondary | ICD-10-CM

## 2024-02-11 DIAGNOSIS — R739 Hyperglycemia, unspecified: Secondary | ICD-10-CM | POA: Diagnosis not present

## 2024-02-11 DIAGNOSIS — E7800 Pure hypercholesterolemia, unspecified: Secondary | ICD-10-CM | POA: Diagnosis not present

## 2024-02-11 LAB — LIPID PANEL
Cholesterol: 138 mg/dL (ref 0–200)
HDL: 47.1 mg/dL (ref >39.00)
LDL Cholesterol: 70 mg/dL (ref 0–99)
NonHDL: 91.08
Total CHOL/HDL Ratio: 3
Triglycerides: 106 mg/dL (ref 0.0–149.0)
VLDL: 21.2 mg/dL (ref 0.0–40.0)

## 2024-02-11 LAB — CBC WITH DIFFERENTIAL/PLATELET
Basophils Absolute: 0 K/uL (ref 0.0–0.1)
Basophils Relative: 0.4 % (ref 0.0–3.0)
Eosinophils Absolute: 0 K/uL (ref 0.0–0.7)
Eosinophils Relative: 0.5 % (ref 0.0–5.0)
HCT: 42.8 % (ref 39.0–52.0)
Hemoglobin: 14.4 g/dL (ref 13.0–17.0)
Lymphocytes Relative: 12.9 % (ref 12.0–46.0)
Lymphs Abs: 0.9 K/uL (ref 0.7–4.0)
MCHC: 33.7 g/dL (ref 30.0–36.0)
MCV: 94.6 fl (ref 78.0–100.0)
Monocytes Absolute: 0.9 K/uL (ref 0.1–1.0)
Monocytes Relative: 12.4 % — ABNORMAL HIGH (ref 3.0–12.0)
Neutro Abs: 5.4 K/uL (ref 1.4–7.7)
Neutrophils Relative %: 73.8 % (ref 43.0–77.0)
Platelets: 206 K/uL (ref 150.0–400.0)
RBC: 4.53 Mil/uL (ref 4.22–5.81)
RDW: 12.9 % (ref 11.5–15.5)
WBC: 7.3 K/uL (ref 4.0–10.5)

## 2024-02-11 LAB — HEPATIC FUNCTION PANEL
ALT: 32 U/L (ref 0–53)
AST: 23 U/L (ref 0–37)
Albumin: 4 g/dL (ref 3.5–5.2)
Alkaline Phosphatase: 77 U/L (ref 39–117)
Bilirubin, Direct: 0.2 mg/dL (ref 0.0–0.3)
Total Bilirubin: 0.8 mg/dL (ref 0.2–1.2)
Total Protein: 6.2 g/dL (ref 6.0–8.3)

## 2024-02-11 LAB — PSA: PSA: 1.54 ng/mL (ref 0.10–4.00)

## 2024-02-11 LAB — URINALYSIS, ROUTINE W REFLEX MICROSCOPIC
Bilirubin Urine: NEGATIVE
Hgb urine dipstick: NEGATIVE
Leukocytes,Ua: NEGATIVE
Nitrite: NEGATIVE
RBC / HPF: NONE SEEN (ref 0–?)
Specific Gravity, Urine: 1.015 (ref 1.000–1.030)
Total Protein, Urine: NEGATIVE
Urine Glucose: NEGATIVE
Urobilinogen, UA: 0.2 (ref 0.0–1.0)
pH: 6 (ref 5.0–8.0)

## 2024-02-11 LAB — BASIC METABOLIC PANEL WITH GFR
BUN: 18 mg/dL (ref 6–23)
CO2: 29 meq/L (ref 19–32)
Calcium: 8.9 mg/dL (ref 8.4–10.5)
Chloride: 103 meq/L (ref 96–112)
Creatinine, Ser: 1.07 mg/dL (ref 0.40–1.50)
GFR: 65.82 mL/min (ref >60.00)
Glucose, Bld: 105 mg/dL — ABNORMAL HIGH (ref 70–99)
Potassium: 4.4 meq/L (ref 3.5–5.1)
Sodium: 140 meq/L (ref 135–145)

## 2024-02-11 LAB — HEMOGLOBIN A1C: Hgb A1c MFr Bld: 6.1 % (ref 4.6–6.5)

## 2024-02-11 LAB — TSH: TSH: 1.64 u[IU]/mL (ref 0.35–5.50)

## 2024-02-11 LAB — VITAMIN B12: Vitamin B-12: 393 pg/mL (ref 211–911)

## 2024-02-11 LAB — VITAMIN D 25 HYDROXY (VIT D DEFICIENCY, FRACTURES): VITD: 66.32 ng/mL (ref 30.00–100.00)

## 2024-02-11 NOTE — Progress Notes (Signed)
 Patient ID: [name] [name], male   DOB: 27-Dec-1945, 78 y.o.   MRN: [id number]         Chief Complaint:: yearly exam       HPI:  [name] [name] is a 78 y.o. male here for above, Pt denies chest pain, increased sob or doe, wheezing, orthopnea, PND, increased LE swelling, palpitations, dizziness or syncope.   Pt denies polydipsia, polyuria, or new focal neuro s/s.    Pt denies fever, wt loss, night sweats, loss of appetite, or other constitutional symptom  Gave up snacks and sweets.  Has been able to lose several lbs  Has left plantar fasciitis and had cortisone per podiatry 3 wks ago , now improved overwall.  Also has occasional leg cramps, getting back to walking 2 miles per day with goal of 4.  Does have several wks ongoing nasal allergy symptoms with clearish congestion, itch and sneezing, without fever, pain, ST, cough, swelling or wheezing.  Wt Readings from Last 3 Encounters:  02/11/24 154 lb (69.9 kg)  11/05/23 159 lb 6 oz (72.3 kg)  02/07/23 160 lb (72.6 kg)   BP Readings from Last 3 Encounters:  02/11/24 128/70  11/05/23 136/74  02/07/23 138/72   Immunization History  Administered Date(s) Administered   Fluad Quad(high Dose 65+) 03/21/2019   Influenza Split 04/02/2013   Influenza Whole 03/24/2010, 03/11/2012, 03/13/2013   Influenza, High Dose Seasonal PF 05/03/2009, 03/24/2010, 03/03/2016, 03/27/2018   Influenza-Unspecified 03/02/2011, 03/03/2012, 03/03/2014, 04/19/2014, 03/11/2015, 03/14/2017, 04/03/2019, 05/01/2019, 03/26/2020, 03/26/2020, 04/02/2021   Moderna Covid-19 Fall Seasonal Vaccine 83yrs & older 04/09/2023   Moderna Covid-19 Vaccine Bivalent Booster 40yrs & up 03/30/2021   Moderna Sars-Covid-2 Vaccination 07/16/2019, 07/28/2019, 08/13/2019, 08/25/2019, 04/23/2020, 10/25/2020   Pneumococcal Conjugate-13 07/11/2013, 04/19/2014   Pneumococcal Polysaccharide-23 03/19/2009   Pneumococcal-Unspecified 12/01/2008   Td 12/22/2003, 03/19/2009   Tdap 04/02/2010, 01/17/2019   Tetanus  09/01/2007   Zoster Recombinant(Shingrix) 07/01/2020, 09/10/2020   Zoster, Live 02/20/2006   Health Maintenance Due  Topic Date Due   Medicare Annual Wellness (AWV)  12/31/2022   INFLUENZA VACCINE  02/01/2024      Past Medical History:  Diagnosis Date   Allergic rhinitis    Anxiety 05/01/2011   Atherosclerosis of aorta (HCC)    BPH (benign prostatic hyperplasia)    CAD (coronary artery disease)    Chronic prostatitis    Diverticulitis    DJD (degenerative joint disease), lumbosacral    Hearing loss in right ear    HLD (hyperlipidemia)    HTN (hypertension)    Inguinal hernia without mention of obstruction or gangrene, unilateral or unspecified, (not specified as recurrent)    Ocular migraine    Old myocardial infarction 1989   PUD (peptic ulcer disease)    Transient amnesia    Past Surgical History:  Procedure Laterality Date   APPENDECTOMY     CORONARY STENT PLACEMENT     INGUINAL HERNIA REPAIR      reports that he quit smoking about 43 years ago. His smoking use included cigarettes. He has never used smokeless tobacco. He reports current alcohol use. He reports that he does not use drugs. family history includes Alcohol abuse in his brother and father; Cancer in his father and unknown relative; Heart disease in his unknown relative; Hypertension in his unknown relative. Allergies  Allergen Reactions   Niacin     Hot flashes   Other Other (See Comments)    Environmental allergies/ post nasal drip   Penicillins     As  child: muscles stiffened   Current Outpatient Medications on File Prior to Visit  Medication Sig Dispense Refill   aspirin 81 MG tablet Take 81 mg by mouth daily.     atorvastatin  (LIPITOR) 20 MG tablet Take 1 tablet (20 mg total) by mouth daily. 90 tablet 3   fluticasone  (FLONASE ) 50 MCG/ACT nasal spray Place 2 sprays into both nostrils daily as needed. Place 2 sprays into the nose daily as needed. 45 mL 3   montelukast  (SINGULAIR ) 10 MG tablet TAKE  1 TABLET BY MOUTH DAILY 90 tablet 3   temazepam  (RESTORIL ) 15 MG capsule 1-2 tab by mouth at bedtime as needed for sleep 60 capsule 5   No current facility-administered medications on file prior to visit.        ROS:  All others reviewed and negative.  Objective        PE:  BP 128/70   Pulse 73   Temp 97.9 F (36.6 C)   Ht 5' 7 (1.702 m)   Wt 154 lb (69.9 kg)   SpO2 98%   BMI 24.12 kg/m                 Constitutional: Pt appears in NAD               HENT: Head: NCAT.                Right Ear: External ear normal.                 Left Ear: External ear normal.                Eyes: . Pupils are equal, round, and reactive to light. Conjunctivae and EOM are normal               Nose: without d/c or deformity               Neck: Neck supple. Gross normal ROM               Cardiovascular: Normal rate and regular rhythm.                 Pulmonary/Chest: Effort normal and breath sounds without rales or wheezing.                Abd:  Soft, NT, ND, + BS, no organomegaly               Neurological: Pt is alert. At baseline orientation, motor grossly intact               Skin: Skin is warm. No rashes, no other new lesions, LE edema - none               Psychiatric: Pt behavior is normal without agitation   Micro: none  Cardiac tracings I have personally interpreted today:  none  Pertinent Radiological findings (summarize): none   Lab Results  Component Value Date   WBC 7.3 02/11/2024   HGB 14.4 02/11/2024   HCT 42.8 02/11/2024   PLT 206.0 02/11/2024   GLUCOSE 105 (H) 02/11/2024   CHOL 138 02/11/2024   TRIG 106.0 02/11/2024   HDL 47.10 02/11/2024   LDLDIRECT 116.9 04/27/2010   LDLCALC 70 02/11/2024   ALT 32 02/11/2024   AST 23 02/11/2024   NA 140 02/11/2024   K 4.4 02/11/2024   CL 103 02/11/2024   CREATININE 1.07 02/11/2024   BUN 18 02/11/2024   CO2 29 02/11/2024  TSH 1.64 02/11/2024   PSA 1.54 02/11/2024   INR 1.0 ratio 06/03/2010   HGBA1C 6.1 02/11/2024    Assessment/Plan:  [name] [name] is a 78 y.o. White or Caucasian [1] male with  has a past medical history of Allergic rhinitis, Anxiety (05/01/2011), Atherosclerosis of aorta (HCC), BPH (benign prostatic hyperplasia), CAD (coronary artery disease), Chronic prostatitis, Diverticulitis, DJD (degenerative joint disease), lumbosacral, Hearing loss in right ear, HLD (hyperlipidemia), HTN (hypertension), Inguinal hernia without mention of obstruction or gangrene, unilateral or unspecified, (not specified as recurrent), Ocular migraine, Old myocardial infarction (1989), PUD (peptic ulcer disease), and Transient amnesia.  Vitamin D  deficiency Last vitamin D  Lab Results  Component Value Date   VD25OH 66.32 02/11/2024   Stable, cont oral replacement   Hyperlipemia Lab Results  Component Value Date   LDLCALC 70 02/11/2024   uncontrolled, pt to continue current statin liptior 20 mg every day, declines change today   Hyperglycemia Lab Results  Component Value Date   HGBA1C 6.1 02/11/2024   Stable, pt to continue current medical treatment  - diet,wt control   Essential hypertension BP Readings from Last 3 Encounters:  02/11/24 128/70  11/05/23 136/74  02/07/23 138/72   Stable, pt to continue medical treatment - diet, wt control   Bladder neck obstruction Also for psa with labs  Allergic rhinitis Mild to mod, for restart flonase  asd,  to f/u any worsening symptoms or concerns  Followup: Return in about 1 year (around 02/10/2025).  Lynwood Rush, MD 02/11/2024 9:14 PM  [facility]

## 2024-02-11 NOTE — Assessment & Plan Note (Signed)
 Also for psa with labs

## 2024-02-11 NOTE — Assessment & Plan Note (Signed)
 Last vitamin D  Lab Results  Component Value Date   VD25OH 66.32 02/11/2024   Stable, cont oral replacement

## 2024-02-11 NOTE — Assessment & Plan Note (Signed)
Mild to mod, for restart flonase asd, to f/u any worsening symptoms or concerns

## 2024-02-11 NOTE — Patient Instructions (Signed)

## 2024-02-11 NOTE — Assessment & Plan Note (Signed)
 Lab Results  Component Value Date   LDLCALC 70 02/11/2024   uncontrolled, pt to continue current statin liptior 20 mg every day, declines change today

## 2024-02-11 NOTE — Assessment & Plan Note (Signed)
 BP Readings from Last 3 Encounters:  02/11/24 128/70  11/05/23 136/74  02/07/23 138/72   Stable, pt to continue medical treatment - diet, wt control

## 2024-02-11 NOTE — Progress Notes (Signed)
 The test results show that your current treatment is OK, as the tests are stable.  Please continue the same plan.  There is no other need for change of treatment or further evaluation based on these results, at this time.  thanks

## 2024-02-11 NOTE — Assessment & Plan Note (Signed)
 Lab Results  Component Value Date   HGBA1C 6.1 02/11/2024   Stable, pt to continue current medical treatment  - diet,wt control

## 2024-05-14 ENCOUNTER — Other Ambulatory Visit: Payer: Self-pay

## 2024-05-14 ENCOUNTER — Encounter: Payer: Self-pay | Admitting: Internal Medicine

## 2024-05-14 MED ORDER — FLUTICASONE PROPIONATE 50 MCG/ACT NA SUSP: 2.0000 | Freq: Every day | NASAL | 3 refills | Status: AC | PRN

## 2025-02-16 ENCOUNTER — Encounter: Admitting: Internal Medicine
